# Patient Record
Sex: Female | Born: 1975
Health system: Southern US, Community
[De-identification: ages and names within clinical notes are randomized; demographics above are authoritative.]

## PROBLEM LIST (undated history)

## (undated) ENCOUNTER — Ambulatory Visit: Admission: EM | Discharge status: Absent without leave | Payer: BC Managed Care – PPO

## (undated) DIAGNOSIS — K449 Diaphragmatic hernia without obstruction or gangrene: Secondary | ICD-10-CM

## (undated) DIAGNOSIS — M545 Low back pain, unspecified: Secondary | ICD-10-CM

## (undated) DIAGNOSIS — I10 Essential (primary) hypertension: Secondary | ICD-10-CM

## (undated) DIAGNOSIS — I214 Non-ST elevation (NSTEMI) myocardial infarction: Secondary | ICD-10-CM

## (undated) DIAGNOSIS — G8929 Other chronic pain: Secondary | ICD-10-CM

## (undated) DIAGNOSIS — E785 Hyperlipidemia, unspecified: Secondary | ICD-10-CM

## (undated) DIAGNOSIS — J45909 Unspecified asthma, uncomplicated: Secondary | ICD-10-CM

## (undated) DIAGNOSIS — F32A Depression, unspecified: Secondary | ICD-10-CM

## (undated) DIAGNOSIS — I2510 Atherosclerotic heart disease of native coronary artery without angina pectoris: Secondary | ICD-10-CM

## (undated) DIAGNOSIS — F419 Anxiety disorder, unspecified: Secondary | ICD-10-CM

## (undated) DIAGNOSIS — F329 Major depressive disorder, single episode, unspecified: Secondary | ICD-10-CM

## (undated) DIAGNOSIS — E119 Type 2 diabetes mellitus without complications: Secondary | ICD-10-CM

## (undated) DIAGNOSIS — Z8489 Family history of other specified conditions: Secondary | ICD-10-CM

## (undated) DIAGNOSIS — E669 Obesity, unspecified: Secondary | ICD-10-CM

## (undated) DIAGNOSIS — K219 Gastro-esophageal reflux disease without esophagitis: Secondary | ICD-10-CM

## (undated) DIAGNOSIS — F411 Generalized anxiety disorder: Secondary | ICD-10-CM

## (undated) DIAGNOSIS — J329 Chronic sinusitis, unspecified: Secondary | ICD-10-CM

## (undated) DIAGNOSIS — R7989 Other specified abnormal findings of blood chemistry: Secondary | ICD-10-CM

## (undated) DIAGNOSIS — Z955 Presence of coronary angioplasty implant and graft: Secondary | ICD-10-CM

## (undated) DIAGNOSIS — I519 Heart disease, unspecified: Secondary | ICD-10-CM

## (undated) DIAGNOSIS — K829 Disease of gallbladder, unspecified: Secondary | ICD-10-CM

## (undated) DIAGNOSIS — Z01419 Encounter for gynecological examination (general) (routine) without abnormal findings: Secondary | ICD-10-CM

## (undated) DIAGNOSIS — I252 Old myocardial infarction: Secondary | ICD-10-CM

## (undated) DIAGNOSIS — J020 Streptococcal pharyngitis: Secondary | ICD-10-CM

## (undated) HISTORY — DX: Streptococcal pharyngitis: J02.0

## (undated) HISTORY — DX: Presence of coronary angioplasty implant and graft: Z95.5

## (undated) HISTORY — DX: Chronic sinusitis, unspecified: J32.9

## (undated) HISTORY — DX: Disease of gallbladder, unspecified: K82.9

## (undated) HISTORY — DX: Other specified abnormal findings of blood chemistry: R79.89

## (undated) HISTORY — DX: Heart disease, unspecified: I51.9

## (undated) HISTORY — DX: Old myocardial infarction: I25.2

## (undated) HISTORY — DX: Essential (primary) hypertension: I10

## (undated) HISTORY — DX: Unspecified asthma, uncomplicated: J45.909

## (undated) HISTORY — DX: Encounter for gynecological examination (general) (routine) without abnormal findings: Z01.419

## (undated) HISTORY — DX: Generalized anxiety disorder: F41.1

## (undated) HISTORY — DX: Obesity, unspecified: E66.9

## (undated) HISTORY — PX: FRACTURE SURGERY: SHX138

## (undated) HISTORY — DX: Atherosclerotic heart disease of native coronary artery without angina pectoris: I25.10

## (undated) HISTORY — DX: Type 2 diabetes mellitus without complications: E11.9

## (undated) HISTORY — DX: Hyperlipidemia, unspecified: E78.5

## (undated) HISTORY — PX: OTHER SURGICAL HISTORY: SHX169

---

## 1997-01-04 HISTORY — PX: HX EYE SURGERY: 2100001143

## 1997-07-14 ENCOUNTER — Other Ambulatory Visit: Admission: RE | Admit: 1997-07-14 | Discharge: 1997-07-14 | Payer: Self-pay | Admitting: Obstetrics

## 1997-12-20 ENCOUNTER — Encounter: Admission: RE | Admit: 1997-12-20 | Discharge: 1997-12-20 | Payer: Self-pay | Admitting: Obstetrics & Gynecology

## 1998-01-10 ENCOUNTER — Encounter: Admission: RE | Admit: 1998-01-10 | Discharge: 1998-01-10 | Payer: Self-pay | Admitting: Obstetrics & Gynecology

## 1998-01-20 ENCOUNTER — Inpatient Hospital Stay (HOSPITAL_COMMUNITY): Admission: AD | Admit: 1998-01-20 | Discharge: 1998-01-20 | Payer: Self-pay | Admitting: Obstetrics & Gynecology

## 1998-05-21 ENCOUNTER — Other Ambulatory Visit: Admission: RE | Admit: 1998-05-21 | Discharge: 1998-05-21 | Payer: Self-pay | Admitting: Obstetrics and Gynecology

## 1998-05-21 ENCOUNTER — Other Ambulatory Visit: Admission: RE | Admit: 1998-05-21 | Discharge: 1998-05-21 | Payer: Self-pay | Admitting: *Deleted

## 1998-07-24 ENCOUNTER — Ambulatory Visit: Admission: RE | Admit: 1998-07-24 | Discharge: 1998-07-24 | Payer: Self-pay | Admitting: Family Medicine

## 1998-07-24 ENCOUNTER — Encounter: Payer: Self-pay | Admitting: Family Medicine

## 1998-08-26 ENCOUNTER — Ambulatory Visit (HOSPITAL_COMMUNITY): Admission: RE | Admit: 1998-08-26 | Discharge: 1998-08-26 | Payer: Self-pay | Admitting: Gastroenterology

## 1998-12-31 ENCOUNTER — Emergency Department (HOSPITAL_COMMUNITY): Admission: EM | Admit: 1998-12-31 | Discharge: 1998-12-31 | Payer: Self-pay | Admitting: Emergency Medicine

## 1999-01-01 ENCOUNTER — Encounter: Payer: Self-pay | Admitting: Emergency Medicine

## 2000-05-26 ENCOUNTER — Other Ambulatory Visit: Admission: RE | Admit: 2000-05-26 | Discharge: 2000-05-26 | Payer: Self-pay | Admitting: Family Medicine

## 2000-07-20 ENCOUNTER — Other Ambulatory Visit: Admission: RE | Admit: 2000-07-20 | Discharge: 2000-07-20 | Payer: Self-pay | Admitting: Family Medicine

## 2000-09-26 ENCOUNTER — Encounter: Admission: RE | Admit: 2000-09-26 | Discharge: 2000-09-26 | Payer: Self-pay | Admitting: Family Medicine

## 2000-09-26 ENCOUNTER — Encounter: Payer: Self-pay | Admitting: Family Medicine

## 2001-01-04 HISTORY — PX: LEG SURGERY: SHX1003

## 2001-01-04 HISTORY — PX: FEMUR FRACTURE SURGERY: SHX633

## 2001-08-27 ENCOUNTER — Encounter: Admission: RE | Admit: 2001-08-27 | Discharge: 2001-08-27 | Payer: Self-pay | Admitting: Family Medicine

## 2001-08-27 ENCOUNTER — Encounter: Payer: Self-pay | Admitting: Family Medicine

## 2001-09-16 ENCOUNTER — Encounter: Payer: Self-pay | Admitting: Emergency Medicine

## 2001-09-16 ENCOUNTER — Encounter: Payer: Self-pay | Admitting: Specialist

## 2001-09-16 ENCOUNTER — Inpatient Hospital Stay (HOSPITAL_COMMUNITY): Admission: EM | Admit: 2001-09-16 | Discharge: 2001-09-21 | Payer: Self-pay

## 2001-10-06 ENCOUNTER — Encounter: Admission: RE | Admit: 2001-10-06 | Discharge: 2002-01-04 | Payer: Self-pay | Admitting: Specialist

## 2002-01-05 ENCOUNTER — Encounter: Admission: RE | Admit: 2002-01-05 | Discharge: 2002-04-05 | Payer: Self-pay | Admitting: Specialist

## 2002-04-06 ENCOUNTER — Encounter: Admission: RE | Admit: 2002-04-06 | Discharge: 2002-05-25 | Payer: Self-pay | Admitting: Specialist

## 2003-02-13 ENCOUNTER — Encounter: Admission: RE | Admit: 2003-02-13 | Discharge: 2003-02-27 | Payer: Self-pay | Admitting: Family Medicine

## 2004-03-17 ENCOUNTER — Encounter: Admission: RE | Admit: 2004-03-17 | Discharge: 2004-04-16 | Payer: Self-pay | Admitting: Orthopedic Surgery

## 2005-12-31 ENCOUNTER — Other Ambulatory Visit (HOSPITAL_COMMUNITY): Payer: Self-pay

## 2007-12-13 ENCOUNTER — Encounter: Admission: RE | Admit: 2007-12-13 | Discharge: 2007-12-13 | Payer: Self-pay | Admitting: Nurse Practitioner

## 2008-06-20 ENCOUNTER — Emergency Department (HOSPITAL_COMMUNITY): Admission: EM | Admit: 2008-06-20 | Discharge: 2008-06-21 | Payer: Self-pay | Admitting: Emergency Medicine

## 2010-01-24 ENCOUNTER — Encounter: Payer: Self-pay | Admitting: Family Medicine
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
## 2011-09-27 DIAGNOSIS — F411 Generalized anxiety disorder: Secondary | ICD-10-CM | POA: Insufficient documentation

## 2011-09-27 DIAGNOSIS — G4700 Insomnia, unspecified: Secondary | ICD-10-CM | POA: Insufficient documentation

## 2011-10-20 ENCOUNTER — Ambulatory Visit: Payer: Self-pay

## 2011-10-25 DIAGNOSIS — M545 Low back pain, unspecified: Secondary | ICD-10-CM | POA: Insufficient documentation

## 2012-11-10 ENCOUNTER — Emergency Department (HOSPITAL_COMMUNITY)
Admission: EM | Admit: 2012-11-10 | Discharge: 2012-11-10 | Disposition: A | Discharge status: Home / Self Care | Payer: Self-pay | Attending: Emergency Medicine | Admitting: Emergency Medicine

## 2012-11-10 ENCOUNTER — Emergency Department (HOSPITAL_COMMUNITY): Payer: Self-pay

## 2012-11-10 ENCOUNTER — Encounter (HOSPITAL_COMMUNITY): Payer: Self-pay | Admitting: Emergency Medicine

## 2012-11-10 DIAGNOSIS — K029 Dental caries, unspecified: Secondary | ICD-10-CM | POA: Insufficient documentation

## 2012-11-10 DIAGNOSIS — Z79899 Other long term (current) drug therapy: Secondary | ICD-10-CM | POA: Insufficient documentation

## 2012-11-10 DIAGNOSIS — Z880 Allergy status to penicillin: Secondary | ICD-10-CM | POA: Insufficient documentation

## 2012-11-10 LAB — CBC WITH DIFFERENTIAL/PLATELET
Basophils Absolute: 0 10*3/uL (ref 0.0–0.1)
Basophils Relative: 0 % (ref 0–1)
Eosinophils Absolute: 0.3 10*3/uL (ref 0.0–0.7)
Eosinophils Relative: 3 % (ref 0–5)
HCT: 38.9 % (ref 36.0–46.0)
Hemoglobin: 13.1 g/dL (ref 12.0–15.0)
Lymphocytes Relative: 22 % (ref 12–46)
Lymphs Abs: 2 10*3/uL (ref 0.7–4.0)
MCH: 31.4 pg (ref 26.0–34.0)
MCHC: 33.7 g/dL (ref 30.0–36.0)
MCV: 93.3 fL (ref 78.0–100.0)
Monocytes Absolute: 0.9 10*3/uL (ref 0.1–1.0)
Monocytes Relative: 10 % (ref 3–12)
Neutro Abs: 6.1 10*3/uL (ref 1.7–7.7)
Neutrophils Relative %: 65 % (ref 43–77)
Platelets: 235 10*3/uL (ref 150–400)
RBC: 4.17 MIL/uL (ref 3.87–5.11)
RDW: 12.7 % (ref 11.5–15.5)
WBC: 9.4 10*3/uL (ref 4.0–10.5)

## 2012-11-10 LAB — BASIC METABOLIC PANEL
Calcium: 9.5 mg/dL (ref 8.4–10.5)
GFR calc Af Amer: >90 mL/min (ref >90)
GFR calc non Af Amer: >90 mL/min (ref >90)
Potassium: 4.3 mEq/L (ref 3.5–5.1)
Sodium: 137 mEq/L (ref 135–145)

## 2012-11-10 MED ORDER — MORPHINE SULFATE 4 MG/ML IJ SOLN
4.0000 mg | Freq: Once | INTRAMUSCULAR | Status: AC
  Administered 2012-11-10: 4 mg via INTRAVENOUS
  Filled 2012-11-10: qty 1

## 2012-11-10 MED ORDER — IOHEXOL 300 MG/ML  SOLN
75.0000 mL | Freq: Once | INTRAMUSCULAR | Status: AC | PRN
  Administered 2012-11-10: 75 mL via INTRAVENOUS

## 2012-11-10 MED ORDER — CLINDAMYCIN PHOSPHATE 600 MG/50ML IV SOLN
600.0000 mg | Freq: Once | INTRAVENOUS | Status: AC
  Administered 2012-11-10: 600 mg via INTRAVENOUS
  Filled 2012-11-10: qty 50

## 2012-11-10 MED ORDER — KETOROLAC TROMETHAMINE 30 MG/ML IJ SOLN: 30.0000 mg | Freq: Once | INTRAMUSCULAR | Status: DC

## 2012-11-10 MED ORDER — ONDANSETRON HCL 4 MG/2ML IJ SOLN
4.0000 mg | Freq: Once | INTRAMUSCULAR | Status: AC
  Administered 2012-11-10: 4 mg via INTRAVENOUS
  Filled 2012-11-10: qty 2

## 2012-11-10 MED ORDER — HYDROCODONE-ACETAMINOPHEN 5-325 MG PO TABS: 2.0000 | ORAL_TABLET | Freq: Once | ORAL | Status: DC

## 2012-11-10 MED ORDER — ONDANSETRON HCL 4 MG PO TABS
4.0000 mg | ORAL_TABLET | Freq: Once | ORAL | Status: AC
  Administered 2012-11-10: 4 mg via ORAL
  Filled 2012-11-10: qty 1

## 2012-11-10 MED ORDER — MORPHINE SULFATE 4 MG/ML IJ SOLN: 4.0000 mg | Freq: Once | INTRAMUSCULAR | Status: DC

## 2012-11-10 MED ORDER — MORPHINE SULFATE 4 MG/ML IJ SOLN
INTRAMUSCULAR | Status: AC
  Filled 2012-11-10: qty 1

## 2012-11-10 MED ORDER — ONDANSETRON HCL 4 MG/2ML IJ SOLN: 4.0000 mg | Freq: Once | INTRAMUSCULAR | Status: DC

## 2012-11-10 MED ORDER — SODIUM CHLORIDE 0.9 % IJ SOLN
INTRAMUSCULAR | Status: AC
  Filled 2012-11-10: qty 500

## 2012-11-10 NOTE — ED Provider Notes (Signed)
CSN: [id number]     Arrival date & time 11/10/12  1358 History   First MD Initiated Contact with Patient 11/10/12 1447     Chief Complaint  Patient presents with  . Oral Swelling   (Consider location/radiation/quality/duration/timing/severity/associated sxs/prior Treatment) HPI Comments: Patient is a 37 year old female who presents to the emergency department with swelling of my mouth. The patient states that she was seen by the dentist on yesterday November 6 her teeth were evaluated, and it was determined that she would need to be on antibiotics before any action to be taken with her teeth. The patient received a dental block. She was placed on clindamycin and Vicodin. The patient states that this morning she noted some swelling of the area under her tongue. Patient states she had some difficulty with swallowing, felt somewhat dizzy at times, and had more pain. The patient states she spoke with the dentists and was told to come to the emergency department for additional evaluation and possible IV antibiotic. The patient denies any high fever. She has had some nausea but no vomiting. The patient denies any medical conditions that would cause immunocompromise.  The history is provided by the patient.    History reviewed. No pertinent past medical history. Past Surgical History  Procedure Laterality Date  . Fracture surgery     History reviewed. No pertinent family history. History  Substance Use Topics  . Smoking status: Never Smoker   . Smokeless tobacco: Not on file  . Alcohol Use: Yes   OB History   Grav Para Term Preterm Abortions TAB SAB Ect Mult Living                 Review of Systems  Constitutional: Negative for activity change.       All ROS Neg except as noted in HPI  HENT: Positive for dental problem. Negative for nosebleeds.   Eyes: Negative for photophobia and discharge.  Respiratory: Negative for cough, shortness of breath and wheezing.   Cardiovascular: Negative  for chest pain and palpitations.  Gastrointestinal: Negative for abdominal pain and blood in stool.  Genitourinary: Negative for dysuria, frequency and hematuria.  Musculoskeletal: Negative for arthralgias, back pain and neck pain.  Skin: Negative.   Neurological: Negative for dizziness, seizures and speech difficulty.  Psychiatric/Behavioral: Negative for hallucinations and confusion.    Allergies  Augmentin; Benadryl; and Penicillins  Home Medications   Current Outpatient Rx  Name  Route  Sig  Dispense  Refill  . albuterol (PROVENTIL HFA;VENTOLIN HFA) 108 (90 BASE) MCG/ACT inhaler   Inhalation   Inhale 2 puffs into the lungs every 6 (six) hours as needed for wheezing or shortness of breath.         . citalopram (CELEXA) 40 MG tablet   Oral   Take 40 mg by mouth daily.         . clindamycin (CLEOCIN) 300 MG capsule   Oral   Take 300 mg by mouth. 2 capsules on 11/09/12 then 1 capsule every 6 hours until gone         . etonogestrel (NEXPLANON) 68 MG IMPL implant   Subcutaneous   Inject 1 each into the skin once.         . Hydrocodone-Acetaminophen 7.5-300 MG TABS   Oral   Take 1 tablet by mouth every 6 (six) hours.         Marland Kitchen ipratropium-albuterol (DUONEB) 0.5-2.5 (3) MG/3ML SOLN   Nebulization   Take 1.5 mLs by nebulization 3 (  three) times daily as needed (shortness of breath).          . miconazole (MONISTAT-3) KIT   Vaginal   Place 1 kit vaginally at bedtime as needed (yeast infection).           Pulse 98  Temp(Src) 98.7 F (37.1 C) (Oral)  Resp 20  Ht 5\' 7"  (1.702 m)  Wt 238 lb (107.956 kg)  BMI 37.27 kg/m2  SpO2 99% Physical Exam  Nursing note and vitals reviewed. Constitutional: She is oriented to person, place, and time. She appears well-developed and well-nourished.  Non-toxic appearance.  HENT:  Head: Normocephalic.  Right Ear: Tympanic membrane and external ear normal.  Left Ear: Tympanic membrane and external ear normal.  And at the  floor of the mouth along the buccal mucosa. The airway is patent. Speech is clear. There is a cavity of the first molar. There are teeth that are missing. There is a cavity of tooth #5. There is tenderness to the gum in the lateral and anterior portion.  Eyes: EOM and lids are normal. Pupils are equal, round, and reactive to light.  Neck: Normal range of motion. Neck supple. Carotid bruit is not present.  There is fullness in the submental area there is tenderness in this area. The trachea is in the midline.  Cardiovascular: Normal rate, regular rhythm, normal heart sounds, intact distal pulses and normal pulses.   Pulmonary/Chest: Breath sounds normal. No respiratory distress.  Abdominal: Soft. Bowel sounds are normal. There is no tenderness. There is no guarding.  Musculoskeletal: Normal range of motion.  Lymphadenopathy:       Head (right side): No submandibular adenopathy present.       Head (left side): No submandibular adenopathy present.    She has no cervical adenopathy.  Neurological: She is alert and oriented to person, place, and time. She has normal strength. No cranial nerve deficit or sensory deficit.  Skin: Skin is warm and dry.  Psychiatric: She has a normal mood and affect. Her speech is normal.    ED Course  Procedures (including critical care time) Labs Review Labs Reviewed  BASIC METABOLIC PANEL - Abnormal; Notable for the following:    Glucose, Bld 100 (*)    All other components within normal limits  CBC WITH DIFFERENTIAL   Imaging Review Ct Soft Tissue Neck W Contrast  11/10/2012   CLINICAL DATA:  Right submandibular swelling.  Dental disease.  EXAM: CT NECK WITH CONTRAST  TECHNIQUE: Multidetector CT imaging of the neck was performed using the standard protocol following the bolus administration of intravenous contrast.  CONTRAST:  75mL OMNIPAQUE IOHEXOL 300 MG/ML  SOLN  COMPARISON:  None.  FINDINGS: Dental caries, missing teeth, and restorations. Periapical lucency  involving tooth 5 with erosion of the cortex of the adjacent alveolar ridge (image 30/3). Negative for soft tissue abscess. No adenopathy. Normal vascular enhancement. Degenerative disc disease C5-6 with posterior protrusion incidentally noted.  IMPRESSION: 1. Dental caries with periapical abscess of tooth 5 eroding through the cortex of the alveolar ridge. 2. Degenerative disc disease C5-6.   Electronically Signed   By: Oley Balm M.D.   On: 11/10/2012 17:12    EKG Interpretation   None       MDM  No diagnosis found. *I have reviewed nursing notes, vital signs, and all appropriate lab and imaging results for this patient.**  Patient had a CT scan of the neck with contrast which revealed dental caries present. Missing teeth present. There  is a periapical lucency involving tooth #5 with erosion of the cortex to the alveolar ridge. There is also noted degenerative disc disease at C5-C6 with posterior protrusion. There is no soft tissue abscess appreciated. There is no interference with the airway appreciated.  Patient treated with IV morphine for pain, and IV clindamycin. Test results given to the patient. Patient is to be discharged and followed by the dentist next week. Patient advised to return to the emergency department if any changes, problems, or concerns.  Kathie Dike, PA-C 11/13/12 [phone]

## 2012-11-10 NOTE — ED Notes (Signed)
Pain , swelling under tongue, Had  Injection around tooth yesterday, since then increased swelling.  Started clindamycin and vicodin  Yesterday.  Feels dizzy at times.

## 2012-11-13 NOTE — ED Provider Notes (Signed)
Medical screening examination/treatment/procedure(s) were performed by non-physician practitioner and as supervising physician I was immediately available for consultation/collaboration.  EKG Interpretation   None         Taisia Fantini L Satsuki Zillmer, MD 11/13/12 2211 

## 2014-01-04 HISTORY — PX: HX CORONARY STENT PLACEMENT: SHX49

## 2014-06-02 ENCOUNTER — Emergency Department (HOSPITAL_COMMUNITY): Payer: BLUE CROSS/BLUE SHIELD

## 2014-06-02 ENCOUNTER — Emergency Department (HOSPITAL_COMMUNITY)
Admission: EM | Admit: 2014-06-02 | Discharge: 2014-06-03 | Disposition: A | Discharge status: Home / Self Care | Payer: BLUE CROSS/BLUE SHIELD | Attending: Emergency Medicine | Admitting: Emergency Medicine

## 2014-06-02 ENCOUNTER — Encounter (HOSPITAL_COMMUNITY): Payer: Self-pay | Admitting: Emergency Medicine

## 2014-06-02 DIAGNOSIS — Z720 Tobacco use: Secondary | ICD-10-CM | POA: Diagnosis not present

## 2014-06-02 DIAGNOSIS — R079 Chest pain, unspecified: Secondary | ICD-10-CM

## 2014-06-02 DIAGNOSIS — I10 Essential (primary) hypertension: Secondary | ICD-10-CM | POA: Diagnosis not present

## 2014-06-02 DIAGNOSIS — Z79899 Other long term (current) drug therapy: Secondary | ICD-10-CM | POA: Insufficient documentation

## 2014-06-02 DIAGNOSIS — Z8719 Personal history of other diseases of the digestive system: Secondary | ICD-10-CM | POA: Insufficient documentation

## 2014-06-02 DIAGNOSIS — Z880 Allergy status to penicillin: Secondary | ICD-10-CM | POA: Insufficient documentation

## 2014-06-02 DIAGNOSIS — J45909 Unspecified asthma, uncomplicated: Secondary | ICD-10-CM | POA: Insufficient documentation

## 2014-06-02 HISTORY — DX: Diaphragmatic hernia without obstruction or gangrene: K44.9

## 2014-06-02 HISTORY — DX: Essential (primary) hypertension: I10

## 2014-06-02 LAB — CBC
HEMATOCRIT: 38.9 % (ref 36.0–46.0)
HEMOGLOBIN: 13.3 g/dL (ref 12.0–15.0)
MCH: 31.7 pg (ref 26.0–34.0)
MCHC: 34.2 g/dL (ref 30.0–36.0)
MCV: 92.8 fL (ref 78.0–100.0)
Platelets: 212 10*3/uL (ref 150–400)
RBC: 4.19 MIL/uL (ref 3.87–5.11)
RDW: 13.3 % (ref 11.5–15.5)
WBC: 11.5 10*3/uL — AB (ref 4.0–10.5)

## 2014-06-02 LAB — I-STAT TROPONIN, ED: TROPONIN I, POC: 0.02 ng/mL (ref 0.00–0.08)

## 2014-06-02 LAB — BASIC METABOLIC PANEL
Anion gap: 9 (ref 5–15)
BUN: 12 mg/dL (ref 6–20)
CO2: 25 mmol/L (ref 22–32)
Calcium: 8.7 mg/dL — ABNORMAL LOW (ref 8.9–10.3)
Chloride: 103 mmol/L (ref 101–111)
Creatinine, Ser: 0.89 mg/dL (ref 0.44–1.00)
Glucose, Bld: 119 mg/dL — ABNORMAL HIGH (ref 65–99)
POTASSIUM: 3.9 mmol/L (ref 3.5–5.1)
Sodium: 137 mmol/L (ref 135–145)

## 2014-06-02 LAB — BRAIN NATRIURETIC PEPTIDE: B Natriuretic Peptide: 34.1 pg/mL (ref 0.0–100.0)

## 2014-06-02 MED ORDER — MORPHINE SULFATE 4 MG/ML IJ SOLN
4.0000 mg | Freq: Once | INTRAMUSCULAR | Status: AC
  Administered 2014-06-03: 4 mg via INTRAVENOUS
  Filled 2014-06-02: qty 1

## 2014-06-02 MED ORDER — NITROGLYCERIN 0.4 MG SL SUBL: 0.4000 mg | SUBLINGUAL_TABLET | SUBLINGUAL | Status: DC | PRN

## 2014-06-02 NOTE — ED Provider Notes (Signed)
CSN: [id number]     Arrival date & time 06/02/14  2228 History   First MD Initiated Contact with Patient 06/02/14 2259     Chief Complaint  Patient presents with  . Chest Pain  . Anxiety     (Consider location/radiation/quality/duration/timing/severity/associated sxs/prior Treatment) HPI Kristen Price is a 39 year old female with a history of hypertension, asthma, hiatal hernia who presents by EMS for intermittent chest pain that began 4 days ago. She states it has since worsened and is now radiating down the left arm with tingling and into the left side of her neck and left ear. She states it is worse with movement. She is currently on birth control. She is a one pack per day smoker. She denies any daily aspirin use. She states both her mother and father had a heart attack before the age of 25. She denies any fever, chills, cough, shortness of breath, abdominal pain, nausea, vomiting, dysuria, hematuria, urinary frequency, leg swelling. Denies any dyspnea on exertion. She states EMS gave her aspirin in route. Past Medical History  Diagnosis Date  . Hiatal hernia   . Hypertension   . Back ache    Past Surgical History  Procedure Laterality Date  . Fracture surgery     No family history on file. History  Substance Use Topics  . Smoking status: Current Every Day Smoker -- 1.00 packs/day for 25 years    Types: Cigarettes  . Smokeless tobacco: Not on file  . Alcohol Use: Yes     Comment: occasionally   OB History    No data available     Review of Systems  Cardiovascular: Negative for palpitations.  Neurological: Negative for dizziness, weakness, light-headedness and headaches.  All other systems reviewed and are negative.     Allergies  Augmentin; Benadryl; and Penicillins  Home Medications   Prior to Admission medications   Medication Sig Start Date End Date Taking? Authorizing Provider  albuterol (PROVENTIL HFA;VENTOLIN HFA) 108 (90 BASE) MCG/ACT inhaler Inhale 2 puffs  into the lungs every 6 (six) hours as needed for wheezing or shortness of breath.   Yes Historical Provider, MD  alprazolam Duanne Moron) 2 MG tablet Take 0.5-2 mg by mouth 2 (two) times daily as needed for sleep or anxiety.   Yes Historical Provider, MD  escitalopram (LEXAPRO) 20 MG tablet Take 20 mg by mouth daily.   Yes Historical Provider, MD  etonogestrel (NEXPLANON) 68 MG IMPL implant Inject 1 each into the skin once.   Yes Historical Provider, MD  HYDROcodone-acetaminophen (NORCO) 10-325 MG per tablet Take 1 tablet by mouth every 6 (six) hours as needed for moderate pain.   Yes Historical Provider, MD  ipratropium-albuterol (DUONEB) 0.5-2.5 (3) MG/3ML SOLN Take 1.5 mLs by nebulization 3 (three) times daily as needed (shortness of breath).    Yes Historical Provider, MD  methocarbamol (ROBAXIN) 750 MG tablet Take 750 mg by mouth every 6 (six) hours as needed for muscle spasms.   Yes Historical Provider, MD  metoprolol (LOPRESSOR) 50 MG tablet Take 50 mg by mouth 2 (two) times daily.   Yes Historical Provider, MD  miconazole (MONISTAT-3) KIT Place 1 kit vaginally at bedtime as needed (yeast infection).    Yes Historical Provider, MD  montelukast (SINGULAIR) 10 MG tablet Take 10 mg by mouth at bedtime.   Yes Historical Provider, MD  omeprazole (PRILOSEC) 40 MG capsule Take 40 mg by mouth daily.   Yes Historical Provider, MD   BP 114/66 mmHg  Pulse 73  Temp(Src) 99.3 F (37.4 C) (Oral)  Resp 22  Ht _0  (1.702 m)  Wt 194 lb (87.998 kg)  BMI 30.38 kg/m2  SpO2 94% Physical Exam  Constitutional: She is oriented to person, place, and time. She appears well-developed and well-nourished.  HENT:  Head: Normocephalic and atraumatic.  Eyes: Conjunctivae are normal.  Neck: Normal range of motion. Neck supple.  Cardiovascular: Normal rate, regular rhythm and normal heart sounds.   No murmur heard. Pulmonary/Chest: Effort normal and breath sounds normal. No respiratory distress. She has no wheezes.  She has no rales.  Abdominal: Soft. There is no tenderness.  Musculoskeletal: Normal range of motion. She exhibits no edema.  Neurological: She is alert and oriented to person, place, and time.  Skin: Skin is warm and dry.  Nursing note and vitals reviewed.   ED Course  Procedures (including critical care time) Labs Review Labs Reviewed  CBC - Abnormal; Notable for the following:    WBC 11.5 (*)    All other components within normal limits  BASIC METABOLIC PANEL - Abnormal; Notable for the following:    Glucose, Bld 119 (*)    Calcium 8.7 (*)    All other components within normal limits  BRAIN NATRIURETIC PEPTIDE  I-STAT TROPOININ, ED  Randolm Idol, ED    Imaging Review Dg Chest Port 1 View  06/02/2014   CLINICAL DATA:  Chest pain, anxiety.  EXAM: PORTABLE CHEST - 1 VIEW  COMPARISON:  12/13/2007  FINDINGS: The cardiomediastinal contours are normal. The lungs are clear. Pulmonary vasculature is normal. No consolidation, pleural effusion, or pneumothorax. No acute osseous abnormalities are seen.  IMPRESSION: No acute pulmonary process.   Electronically Signed   By: Jeb Levering M.D.   On: 06/02/2014 23:45     EKG Interpretation   Date/Time:  Sunday Jun 02 2014 22:41:42 EDT Ventricular Rate:  79 PR Interval:  134 QRS Duration: 88 QT Interval:  403 QTC Calculation: 462 R Axis:   73 Text Interpretation:  Sinus rhythm normal intervals No old tracing to  compare Confirmed by Kathrynn Humble, MD, Thelma Comp [phone]) on 06/02/2014 11:55:58 PM      MDM   Final diagnoses:  Chest pain, unspecified chest pain type   A with a history of asthma and hypertension presents for intermittent chest pain for the last 4 days which is worse today with radiation down the left arm and left side of her neck. Her vitals and EKG are normal.  Her troponin is negative and labs are not concerning. CXR is negative for pneumonia, edema, or pneumothorax. I reviewed the heart score and she is at low risk for  major cardiac event.  No prior cardiac history. She will get a repeat troponin in 4 hours. I discussed this patient with Dr. Kathrynn Humble who has seen and evaluated the patient. She has been given both morphine and nitro.  She had aspirin en route to hospital by EMS.  I also reviewed the PERC score and the patient is not tachycardic, no prior history of DVT, no cough, and no pleuritic pain.  My suspicion for PE is very low.  I transferred care of this patient to Dr. Dollene Cleveland and the plan is to send the patient home with follow up if the troponin is negative.       Ottie Glazier, PA-C 06/03/14 [place], MD 06/09/14 1539

## 2014-06-02 NOTE — ED Notes (Signed)
Per EMS: cp for week, does not know what really started it, hurt under left arm radiates down left arm.  Took 3 hydrocodone for back, kind of relieved chest pain.  Last was 2030.  Really anxious at scene with EMS.  FD states patient was having a sort of panic attack.  VSS.  Ambulatory to room. Met ems at door.  On phone in ems truck during whole ride per ems.

## 2014-06-02 NOTE — ED Notes (Signed)
Pt states she was passed out while ems was there.  Ems did not report this.

## 2014-06-03 LAB — I-STAT TROPONIN, ED: TROPONIN I, POC: 0.04 ng/mL (ref 0.00–0.08)

## 2014-06-03 MED ORDER — NITROGLYCERIN 0.4 MG SL SUBL: 0.4000 mg | SUBLINGUAL_TABLET | SUBLINGUAL | Status: DC | PRN

## 2014-06-03 NOTE — Discharge Instructions (Signed)
We saw you in the ER for the chest pain/shortness of breath. All of our cardiac workup is normal, including labs, EKG and chest X-RAY are normal. We are not sure what is causing your discomfort, but we feel comfortable sending you home at this time. The workup in the ER is not complete, and you should follow up with your primary care doctor for further evaluation.  Return to the ER if the chest pain gets more constant, there is sweats, shortness of breath, nausea.   Chest Pain (Nonspecific) It is often hard to give a specific diagnosis for the cause of chest pain. There is always a chance that your pain could be related to something serious, such as a heart attack or a blood clot in the lungs. You need to follow up with your health care provider for further evaluation. CAUSES   Heartburn.  Pneumonia or bronchitis.  Anxiety or stress.  Inflammation around your heart (pericarditis) or lung (pleuritis or pleurisy).  A blood clot in the lung.  A collapsed lung (pneumothorax). It can develop suddenly on its own (spontaneous pneumothorax) or from trauma to the chest.  Shingles infection (herpes zoster virus). The chest wall is composed of bones, muscles, and cartilage. Any of these can be the source of the pain.  The bones can be bruised by injury.  The muscles or cartilage can be strained by coughing or overwork.  The cartilage can be affected by inflammation and become sore (costochondritis). DIAGNOSIS  Lab tests or other studies may be needed to find the cause of your pain. Your health care provider may have you take a test called an ambulatory electrocardiogram (ECG). An ECG records your heartbeat patterns over a 24-hour period. You may also have other tests, such as:  Transthoracic echocardiogram (TTE). During echocardiography, sound waves are used to evaluate how blood flows through your heart.  Transesophageal echocardiogram (TEE).  Cardiac monitoring. This allows your health  care provider to monitor your heart rate and rhythm in real time.  Holter monitor. This is a portable device that records your heartbeat and can help diagnose heart arrhythmias. It allows your health care provider to track your heart activity for several days, if needed.  Stress tests by exercise or by giving medicine that makes the heart beat faster. TREATMENT   Treatment depends on what may be causing your chest pain. Treatment may include:  Acid blockers for heartburn.  Anti-inflammatory medicine.  Pain medicine for inflammatory conditions.  Antibiotics if an infection is present.  You may be advised to change lifestyle habits. This includes stopping smoking and avoiding alcohol, caffeine, and chocolate.  You may be advised to keep your head raised (elevated) when sleeping. This reduces the chance of acid going backward from your stomach into your esophagus. Most of the time, nonspecific chest pain will improve within 2-3 days with rest and mild pain medicine.  HOME CARE INSTRUCTIONS   If antibiotics were prescribed, take them as directed. Finish them even if you start to feel better.  For the next few days, avoid physical activities that bring on chest pain. Continue physical activities as directed.  Do not use any tobacco products, including cigarettes, chewing tobacco, or electronic cigarettes.  Avoid drinking alcohol.  Only take medicine as directed by your health care provider.  Follow your health care provider's suggestions for further testing if your chest pain does not go away.  Keep any follow-up appointments you made. If you do not go to an appointment, you  could develop lasting (chronic) problems with pain. If there is any problem keeping an appointment, call to reschedule. SEEK MEDICAL CARE IF:   Your chest pain does not go away, even after treatment.  You have a rash with blisters on your chest.  You have a fever. SEEK IMMEDIATE MEDICAL CARE IF:   You have  increased chest pain or pain that spreads to your arm, neck, jaw, back, or abdomen.  You have shortness of breath.  You have an increasing cough, or you cough up blood.  You have severe back or abdominal pain.  You feel nauseous or vomit.  You have severe weakness.  You faint.  You have chills. This is an emergency. Do not wait to see if the pain will go away. Get medical help at once. Call your local emergency services (911 in [place] Do not drive yourself to the hospital. MAKE SURE YOU:   Understand these instructions.  Will watch your condition.  Will get help right away if you are not doing well or get worse. Document Released: 09/30/2004 Document Revised: 12/26/2012 Document Reviewed: 07/27/2007 [facility] Patient Information 2015 [place], [place]. This information is not intended to replace advice given to you by your health care provider. Make sure you discuss any questions you have with your health care provider.

## 2014-06-03 NOTE — ED Provider Notes (Addendum)
Medical screening examination/treatment/procedure(s) were conducted as a shared visit with non-physician practitioner(s) and myself.  I personally evaluated the patient during the encounter.   EKG Interpretation   Date/Time:  Sunday Jun 02 2014 22:41:42 EDT Ventricular Rate:  79 PR Interval:  134 QRS Duration: 88 QT Interval:  403 QTC Calculation: 462 R Axis:   73 Text Interpretation:  Sinus rhythm normal intervals No old tracing to  compare Confirmed by Rhunette CroftNANAVATI, MD, Janey GentaANKIT [phone] on 06/02/2014 11:55:58 PM      Chest pain free currently. Trops x 2 neg. Pain has CP with atypical and typical features, but has significant cardiac hx in family, so spoke with the fellow on call, and they will contact the patient for an outpatient stress. Return precautions discussed. Pt agreeable with the plan.  Derwood KaplanAnkit Veronia Laprise, MD 06/03/14 [phone]  Derwood KaplanAnkit Naquan Garman, MD 06/09/14 [phone]

## 2014-06-03 NOTE — ED Notes (Signed)
Pt stable, ambulatory, states understanding of discharge instructions, family at bedside. 

## 2014-06-06 ENCOUNTER — Encounter (HOSPITAL_COMMUNITY): Payer: Self-pay | Admitting: Emergency Medicine

## 2014-06-06 ENCOUNTER — Emergency Department (HOSPITAL_COMMUNITY): Payer: BLUE CROSS/BLUE SHIELD

## 2014-06-06 ENCOUNTER — Inpatient Hospital Stay (HOSPITAL_COMMUNITY)
Admission: EM | Admit: 2014-06-06 | Discharge: 2014-06-08 | DRG: 247 | Disposition: A | Discharge status: Home / Self Care | Payer: BLUE CROSS/BLUE SHIELD | Attending: Cardiovascular Disease | Admitting: Cardiovascular Disease

## 2014-06-06 DIAGNOSIS — Z880 Allergy status to penicillin: Secondary | ICD-10-CM | POA: Diagnosis not present

## 2014-06-06 DIAGNOSIS — R778 Other specified abnormalities of plasma proteins: Secondary | ICD-10-CM | POA: Insufficient documentation

## 2014-06-06 DIAGNOSIS — F419 Anxiety disorder, unspecified: Secondary | ICD-10-CM | POA: Diagnosis present

## 2014-06-06 DIAGNOSIS — I252 Old myocardial infarction: Secondary | ICD-10-CM

## 2014-06-06 DIAGNOSIS — Z888 Allergy status to other drugs, medicaments and biological substances status: Secondary | ICD-10-CM

## 2014-06-06 DIAGNOSIS — Z720 Tobacco use: Secondary | ICD-10-CM

## 2014-06-06 DIAGNOSIS — I249 Acute ischemic heart disease, unspecified: Secondary | ICD-10-CM

## 2014-06-06 DIAGNOSIS — Z79891 Long term (current) use of opiate analgesic: Secondary | ICD-10-CM | POA: Diagnosis not present

## 2014-06-06 DIAGNOSIS — K449 Diaphragmatic hernia without obstruction or gangrene: Secondary | ICD-10-CM | POA: Diagnosis present

## 2014-06-06 DIAGNOSIS — Z79899 Other long term (current) drug therapy: Secondary | ICD-10-CM | POA: Diagnosis not present

## 2014-06-06 DIAGNOSIS — I214 Non-ST elevation (NSTEMI) myocardial infarction: Principal | ICD-10-CM

## 2014-06-06 DIAGNOSIS — Z8249 Family history of ischemic heart disease and other diseases of the circulatory system: Secondary | ICD-10-CM

## 2014-06-06 DIAGNOSIS — K219 Gastro-esophageal reflux disease without esophagitis: Secondary | ICD-10-CM | POA: Diagnosis present

## 2014-06-06 DIAGNOSIS — R079 Chest pain, unspecified: Secondary | ICD-10-CM

## 2014-06-06 DIAGNOSIS — Z881 Allergy status to other antibiotic agents status: Secondary | ICD-10-CM

## 2014-06-06 DIAGNOSIS — I10 Essential (primary) hypertension: Secondary | ICD-10-CM | POA: Diagnosis present

## 2014-06-06 DIAGNOSIS — R7989 Other specified abnormal findings of blood chemistry: Secondary | ICD-10-CM

## 2014-06-06 HISTORY — DX: Old myocardial infarction: I25.2

## 2014-06-06 HISTORY — DX: Low back pain, unspecified: M54.50

## 2014-06-06 HISTORY — DX: Family history of other specified conditions: Z84.89

## 2014-06-06 HISTORY — DX: Low back pain: M54.5

## 2014-06-06 HISTORY — DX: Gastro-esophageal reflux disease without esophagitis: K21.9

## 2014-06-06 HISTORY — DX: Anxiety disorder, unspecified: F41.9

## 2014-06-06 HISTORY — DX: Depression, unspecified: F32.A

## 2014-06-06 HISTORY — DX: Other chronic pain: G89.29

## 2014-06-06 HISTORY — DX: Major depressive disorder, single episode, unspecified: F32.9

## 2014-06-06 HISTORY — DX: Unspecified asthma, uncomplicated: J45.909

## 2014-06-06 HISTORY — DX: Non-ST elevation (NSTEMI) myocardial infarction: I21.4

## 2014-06-06 LAB — CBC
HEMATOCRIT: 40.1 % (ref 36.0–46.0)
HEMOGLOBIN: 13.5 g/dL (ref 12.0–15.0)
MCH: 31.3 pg (ref 26.0–34.0)
MCHC: 33.7 g/dL (ref 30.0–36.0)
MCV: 93 fL (ref 78.0–100.0)
Platelets: 222 10*3/uL (ref 150–400)
RBC: 4.31 MIL/uL (ref 3.87–5.11)
RDW: 13.3 % (ref 11.5–15.5)
WBC: 12.8 10*3/uL — ABNORMAL HIGH (ref 4.0–10.5)

## 2014-06-06 LAB — TROPONIN I
Troponin I: 0.99 ng/mL (ref <0.031)
Troponin I: 0.99 ng/mL (ref <0.031)

## 2014-06-06 LAB — COMPREHENSIVE METABOLIC PANEL
ALT: 30 U/L (ref 14–54)
ANION GAP: 6 (ref 5–15)
AST: 30 U/L (ref 15–41)
Albumin: 3.5 g/dL (ref 3.5–5.0)
Alkaline Phosphatase: 58 U/L (ref 38–126)
BILIRUBIN TOTAL: 0.4 mg/dL (ref 0.3–1.2)
BUN: 9 mg/dL (ref 6–20)
CO2: 25 mmol/L (ref 22–32)
Calcium: 8.9 mg/dL (ref 8.9–10.3)
Chloride: 107 mmol/L (ref 101–111)
Creatinine, Ser: 0.76 mg/dL (ref 0.44–1.00)
GFR calc non Af Amer: >60 mL/min (ref >60)
Glucose, Bld: 115 mg/dL — ABNORMAL HIGH (ref 65–99)
Potassium: 4.2 mmol/L (ref 3.5–5.1)
Sodium: 138 mmol/L (ref 135–145)
Total Protein: 6.1 g/dL — ABNORMAL LOW (ref 6.5–8.1)

## 2014-06-06 LAB — GLUCOSE, CAPILLARY: Glucose-Capillary: 120 mg/dL — ABNORMAL HIGH (ref 65–99)

## 2014-06-06 LAB — HEPARIN LEVEL (UNFRACTIONATED): Heparin Unfractionated: 0.41 IU/mL (ref 0.30–0.70)

## 2014-06-06 LAB — LIPASE, BLOOD: Lipase: 19 U/L — ABNORMAL LOW (ref 22–51)

## 2014-06-06 MED ORDER — ASPIRIN 81 MG PO CHEW
81.0000 mg | CHEWABLE_TABLET | ORAL | Status: AC
  Administered 2014-06-07: 81 mg via ORAL
  Filled 2014-06-06: qty 1

## 2014-06-06 MED ORDER — NITROGLYCERIN IN D5W 200-5 MCG/ML-% IV SOLN
5.0000 ug/min | INTRAVENOUS | Status: DC
  Administered 2014-06-06: 5 ug/min via INTRAVENOUS
  Filled 2014-06-06 (×2): qty 250

## 2014-06-06 MED ORDER — ALPRAZOLAM 0.25 MG PO TABS
0.2500 mg | ORAL_TABLET | Freq: Two times a day (BID) | ORAL | Status: DC | PRN
  Administered 2014-06-06: 0.25 mg via ORAL
  Filled 2014-06-06: qty 1

## 2014-06-06 MED ORDER — NICOTINE 21 MG/24HR TD PT24
21.0000 mg | MEDICATED_PATCH | Freq: Every day | TRANSDERMAL | Status: DC
  Administered 2014-06-06 – 2014-06-08 (×3): 21 mg via TRANSDERMAL
  Filled 2014-06-06 (×4): qty 1

## 2014-06-06 MED ORDER — ALBUTEROL SULFATE HFA 108 (90 BASE) MCG/ACT IN AERS: 2.0000 | INHALATION_SPRAY | Freq: Four times a day (QID) | RESPIRATORY_TRACT | Status: DC | PRN

## 2014-06-06 MED ORDER — HEPARIN (PORCINE) IN NACL 100-0.45 UNIT/ML-% IJ SOLN
1150.0000 [IU]/h | INTRAMUSCULAR | Status: DC
  Administered 2014-06-06: 1000 [IU]/h via INTRAVENOUS
  Filled 2014-06-06 (×3): qty 250

## 2014-06-06 MED ORDER — NITROGLYCERIN 0.4 MG SL SUBL
0.4000 mg | SUBLINGUAL_TABLET | SUBLINGUAL | Status: DC | PRN
  Administered 2014-06-06: 0.4 mg via SUBLINGUAL
  Filled 2014-06-06 (×2): qty 1

## 2014-06-06 MED ORDER — HEPARIN BOLUS VIA INFUSION
4000.0000 [IU] | Freq: Once | INTRAVENOUS | Status: AC
  Administered 2014-06-06: 4000 [IU] via INTRAVENOUS
  Filled 2014-06-06: qty 4000

## 2014-06-06 MED ORDER — ACETAMINOPHEN 325 MG PO TABS
650.0000 mg | ORAL_TABLET | ORAL | Status: DC | PRN
Start: 2014-06-06 — End: 2014-06-07
  Administered 2014-06-06 – 2014-06-07 (×2): 650 mg via ORAL
  Filled 2014-06-06 (×2): qty 2

## 2014-06-06 MED ORDER — ASPIRIN 81 MG PO CHEW
324.0000 mg | CHEWABLE_TABLET | Freq: Once | ORAL | Status: AC
  Administered 2014-06-06: 324 mg via ORAL
  Filled 2014-06-06: qty 4

## 2014-06-06 MED ORDER — FAMOTIDINE 20 MG PO TABS
20.0000 mg | ORAL_TABLET | Freq: Once | ORAL | Status: AC
  Administered 2014-06-06: 20 mg via ORAL
  Filled 2014-06-06: qty 1

## 2014-06-06 MED ORDER — SODIUM CHLORIDE 0.9 % IJ SOLN: 3.0000 mL | Freq: Two times a day (BID) | INTRAMUSCULAR | Status: DC

## 2014-06-06 MED ORDER — SODIUM CHLORIDE 0.9 % WEIGHT BASED INFUSION: 1.0000 mL/kg/h | INTRAVENOUS | Status: DC

## 2014-06-06 MED ORDER — GI COCKTAIL ~~LOC~~
30.0000 mL | Freq: Once | ORAL | Status: AC
  Administered 2014-06-06: 30 mL via ORAL
  Filled 2014-06-06: qty 30

## 2014-06-06 MED ORDER — ENSURE ENLIVE PO LIQD
237.0000 mL | Freq: Two times a day (BID) | ORAL | Status: DC
Start: 2014-06-07 — End: 2014-06-08
  Filled 2014-06-06 (×5): qty 237

## 2014-06-06 MED ORDER — ONDANSETRON HCL 4 MG/2ML IJ SOLN: 4.0000 mg | Freq: Four times a day (QID) | INTRAMUSCULAR | Status: DC | PRN

## 2014-06-06 MED ORDER — ASPIRIN EC 81 MG PO TBEC
81.0000 mg | DELAYED_RELEASE_TABLET | Freq: Every day | ORAL | Status: DC
  Filled 2014-06-06: qty 1

## 2014-06-06 MED ORDER — SODIUM CHLORIDE 0.9 % WEIGHT BASED INFUSION
3.0000 mL/kg/h | INTRAVENOUS | Status: DC
  Administered 2014-06-07: 3 mL/kg/h via INTRAVENOUS

## 2014-06-06 MED ORDER — ALBUTEROL SULFATE (2.5 MG/3ML) 0.083% IN NEBU
2.5000 mg | INHALATION_SOLUTION | Freq: Four times a day (QID) | RESPIRATORY_TRACT | Status: DC | PRN
  Administered 2014-06-06 – 2014-06-07 (×2): 2.5 mg via RESPIRATORY_TRACT
  Filled 2014-06-06 (×2): qty 3

## 2014-06-06 MED ORDER — MONTELUKAST SODIUM 10 MG PO TABS
10.0000 mg | ORAL_TABLET | Freq: Every day | ORAL | Status: DC
  Administered 2014-06-06 – 2014-06-08 (×2): 10 mg via ORAL
  Filled 2014-06-06 (×4): qty 1

## 2014-06-06 MED ORDER — SODIUM CHLORIDE 0.9 % IV SOLN: 250.0000 mL | INTRAVENOUS | Status: DC | PRN

## 2014-06-06 MED ORDER — ESCITALOPRAM OXALATE 20 MG PO TABS
20.0000 mg | ORAL_TABLET | Freq: Every day | ORAL | Status: DC
  Administered 2014-06-07 – 2014-06-08 (×2): 20 mg via ORAL
  Filled 2014-06-06 (×3): qty 1

## 2014-06-06 MED ORDER — ATORVASTATIN CALCIUM 80 MG PO TABS
80.0000 mg | ORAL_TABLET | Freq: Every day | ORAL | Status: DC
  Administered 2014-06-06: 80 mg via ORAL
  Filled 2014-06-06 (×2): qty 1

## 2014-06-06 MED ORDER — ALPRAZOLAM 0.5 MG PO TABS
0.5000 mg | ORAL_TABLET | Freq: Two times a day (BID) | ORAL | Status: DC | PRN
  Administered 2014-06-06: 0.25 mg via ORAL
  Filled 2014-06-06: qty 1

## 2014-06-06 MED ORDER — SODIUM CHLORIDE 0.9 % IJ SOLN: 3.0000 mL | INTRAMUSCULAR | Status: DC | PRN

## 2014-06-06 MED ORDER — PANTOPRAZOLE SODIUM 40 MG PO TBEC
40.0000 mg | DELAYED_RELEASE_TABLET | Freq: Every day | ORAL | Status: DC
  Administered 2014-06-07 – 2014-06-08 (×2): 40 mg via ORAL
  Filled 2014-06-06 (×3): qty 1

## 2014-06-06 NOTE — Progress Notes (Signed)
Kristen Price paged due to pt reporting chest pain 1 on scale on 0-10. 1 Nitro given. Pt now reports she is chest pain free. RN will cont to monitor.

## 2014-06-06 NOTE — ED Notes (Signed)
Pt. Is going to x-ray. 

## 2014-06-06 NOTE — ED Notes (Signed)
Critical troponin 0.99 received from lab. EDP aware.

## 2014-06-06 NOTE — ED Notes (Signed)
Ordered Heart Healthy Diet meal tray. 1424

## 2014-06-06 NOTE — ED Notes (Signed)
Cards at bedisde

## 2014-06-06 NOTE — ED Provider Notes (Addendum)
CSN: [id number]     Arrival date & time 06/06/14  1144 History   First MD Initiated Contact with Patient 06/06/14 1207     Chief Complaint  Patient presents with  . Chest Pain     (Consider location/radiation/quality/duration/timing/severity/associated sxs/prior Treatment) Patient is a 39 y.o. female presenting with chest pain. The history is provided by the patient.  Chest Pain Associated symptoms: no abdominal pain, no back pain, no cough, no fever, no headache, no palpitations, no shortness of breath and not vomiting   Patient presents w left cp for the past 2-3 days (was seen in ED 5/29 for same, and saw pcp for same today, and was referred to ED for possible 'gi workup').  Pain is constant. Dull. At rest. States it makes her toss and turn at night when trying to sleep. No associated nv, diaphoresis or sob. Pain is not pleuritic. Pain does radiates up towards left neck and head. States had previously, and resolved, but now persistent for the past few days. No back or flank pain. No exertional cp or unusual doe or fatigue. +fam hx cad at early age, parents. Denies cough or uri c/o. No fever or chills. Denies chest wall injury or strain. No hemoptysis. Denies recent surgery, immobility, trauma or travel. Non smoker, was a former smoker. No leg pain or swelling. Denies heartburn or hx gerd.       Past Medical History  Diagnosis Date  . Hiatal hernia   . Hypertension   . Back ache    Past Surgical History  Procedure Laterality Date  . Fracture surgery     History reviewed. No pertinent family history. History  Substance Use Topics  . Smoking status: Former Smoker -- 1.00 packs/day for 25 years    Types: Cigarettes    Quit date: 06/06/2010  . Smokeless tobacco: Not on file  . Alcohol Use: Yes     Comment: occasionally   OB History    No data available     Review of Systems  Constitutional: Negative for fever and chills.  HENT: Negative for sore throat.   Eyes: Negative for  redness.  Respiratory: Negative for cough and shortness of breath.   Cardiovascular: Positive for chest pain. Negative for palpitations and leg swelling.  Gastrointestinal: Negative for vomiting, abdominal pain and diarrhea.  Endocrine: Negative for polyuria.  Genitourinary: Negative for dysuria and flank pain.  Musculoskeletal: Negative for back pain and neck pain.  Skin: Negative for rash.  Neurological: Negative for headaches.  Hematological: Does not bruise/bleed easily.  Psychiatric/Behavioral: Negative for confusion.      Allergies  Augmentin; Benadryl; and Penicillins  Home Medications   Prior to Admission medications   Medication Sig Start Date End Date Taking? Authorizing Provider  albuterol (PROVENTIL HFA;VENTOLIN HFA) 108 (90 BASE) MCG/ACT inhaler Inhale 2 puffs into the lungs every 6 (six) hours as needed for wheezing or shortness of breath.    Historical Provider, MD  alprazolam Duanne Moron) 2 MG tablet Take 0.5-2 mg by mouth 2 (two) times daily as needed for sleep or anxiety.    Historical Provider, MD  escitalopram (LEXAPRO) 20 MG tablet Take 20 mg by mouth daily.    Historical Provider, MD  etonogestrel (NEXPLANON) 68 MG IMPL implant Inject 1 each into the skin once.    Historical Provider, MD  HYDROcodone-acetaminophen (NORCO) 10-325 MG per tablet Take 1 tablet by mouth every 6 (six) hours as needed for moderate pain.    Historical Provider, MD  ipratropium-albuterol (  DUONEB) 0.5-2.5 (3) MG/3ML SOLN Take 1.5 mLs by nebulization 3 (three) times daily as needed (shortness of breath).     Historical Provider, MD  methocarbamol (ROBAXIN) 750 MG tablet Take 750 mg by mouth every 6 (six) hours as needed for muscle spasms.    Historical Provider, MD  metoprolol (LOPRESSOR) 50 MG tablet Take 50 mg by mouth 2 (two) times daily.    Historical Provider, MD  miconazole (MONISTAT-3) KIT Place 1 kit vaginally at bedtime as needed (yeast infection).     Historical Provider, MD   montelukast (SINGULAIR) 10 MG tablet Take 10 mg by mouth at bedtime.    Historical Provider, MD  omeprazole (PRILOSEC) 40 MG capsule Take 40 mg by mouth daily.    Historical Provider, MD   BP 100/64 mmHg  Pulse 66  Resp 9  Ht $R'5\' 7"'ea$  (1.702 m)  Wt 194 lb (87.998 kg)  BMI 30.38 kg/m2  SpO2 97% Physical Exam  Constitutional: She appears well-developed and well-nourished. No distress.  HENT:  Mouth/Throat: Oropharynx is clear and moist.  Eyes: Conjunctivae are normal. No scleral icterus.  Neck: Neck supple. No tracheal deviation present.  Cardiovascular: Normal rate, regular rhythm, normal heart sounds and intact distal pulses.  Exam reveals no gallop and no friction rub.   No murmur heard. Pulmonary/Chest: Effort normal and breath sounds normal. No respiratory distress.  Abdominal: Soft. Normal appearance and bowel sounds are normal. She exhibits no distension and no mass. There is no tenderness. There is no rebound and no guarding.  Musculoskeletal: She exhibits no edema or tenderness.  Neurological: She is alert.  Skin: Skin is warm and dry. No rash noted. She is not diaphoretic.  Psychiatric: She has a normal mood and affect.  Nursing note and vitals reviewed.   ED Course  Procedures (including critical care time) Labs Review   Results for orders placed or performed during the hospital encounter of 06/06/14  CBC  Result Value Ref Range   WBC 12.8 (H) 4.0 - 10.5 K/uL   RBC 4.31 3.87 - 5.11 MIL/uL   Hemoglobin 13.5 12.0 - 15.0 g/dL   HCT 40.1 36.0 - 46.0 %   MCV 93.0 78.0 - 100.0 fL   MCH 31.3 26.0 - 34.0 pg   MCHC 33.7 30.0 - 36.0 g/dL   RDW 13.3 11.5 - 15.5 %   Platelets 222 150 - 400 K/uL  Comprehensive metabolic panel  Result Value Ref Range   Sodium 138 135 - 145 mmol/L   Potassium 4.2 3.5 - 5.1 mmol/L   Chloride 107 101 - 111 mmol/L   CO2 25 22 - 32 mmol/L   Glucose, Bld 115 (H) 65 - 99 mg/dL   BUN 9 6 - 20 mg/dL   Creatinine, Ser 0.76 0.44 - 1.00 mg/dL    Calcium 8.9 8.9 - 10.3 mg/dL   Total Protein 6.1 (L) 6.5 - 8.1 g/dL   Albumin 3.5 3.5 - 5.0 g/dL   AST 30 15 - 41 U/L   ALT 30 14 - 54 U/L   Alkaline Phosphatase 58 38 - 126 U/L   Total Bilirubin 0.4 0.3 - 1.2 mg/dL   GFR calc non Af Amer >60 >60 mL/min   GFR calc Af Amer >60 >60 mL/min   Anion gap 6 5 - 15  Lipase, blood  Result Value Ref Range   Lipase 19 (L) 22 - 51 U/L  Troponin I  Result Value Ref Range   Troponin I 0.99 (HH) <0.031 ng/mL  Dg Chest Port 1 View  06/02/2014   CLINICAL DATA:  Chest pain, anxiety.  EXAM: PORTABLE CHEST - 1 VIEW  COMPARISON:  12/13/2007  FINDINGS: The cardiomediastinal contours are normal. The lungs are clear. Pulmonary vasculature is normal. No consolidation, pleural effusion, or pneumothorax. No acute osseous abnormalities are seen.  IMPRESSION: No acute pulmonary process.   Electronically Signed   By: Jeb Levering M.D.   On: 06/02/2014 23:45       Imaging Review No results found.   EKG Interpretation   Date/Time:  Thursday June 06 2014 11:47:55 EDT Ventricular Rate:  63 PR Interval:  145 QRS Duration: 87 QT Interval:  429 QTC Calculation: 439 R Axis:   71 Text Interpretation:  Sinus rhythm Nonspecific T wave abnormality  Confirmed by Ashok Cordia  MD, Elizar Alpern ([id number]) on 06/06/2014 12:08:32 PM      MDM   Iv ns. Labs. Ecg. Cxr.  Will try gi cocktail and pepcid for symptom relief.  Reviewed nursing notes and prior charts for additional history.   Troponin returns elevated. Repeat ecg, no acute st/t changes.   Asa po.  Cardiology called re admit.  Recheck pt feels improved from prior.     Lajean Saver, MD 06/06/14 1325

## 2014-06-06 NOTE — ED Notes (Signed)
Pt. Is back from x-ray

## 2014-06-06 NOTE — Progress Notes (Signed)
ANTICOAGULATION CONSULT NOTE - Follow Up Consult  Pharmacy Consult for heparin Indication: chest pain/ACS/NSTEMI  Allergies  Allergen Reactions  . Augmentin [Amoxicillin-Pot Clavulanate] Nausea And Vomiting  . Benadryl [Diphenhydramine] Other (See Comments)    Patient stated that it makes her "go nuts"  . Penicillins Nausea And Vomiting    Patient Measurements: Height: 5\' 7"  (170.2 cm) Weight: 194 lb (87.998 kg) IBW/kg (Calculated) : 61.6 Heparin Dosing Weight: 80.3  Vital Signs: Temp: 98.4 F (36.9 C) (06/02 2036) Temp Source: Oral (06/02 2036) BP: 105/59 mmHg (06/02 2139) Pulse Rate: 73 (06/02 2139)  Labs:  Recent Labs  06/06/14 1217 06/06/14 2115  HGB 13.5  --   HCT 40.1  --   PLT 222  --   HEPARINUNFRC  --  0.41  CREATININE 0.76  --   TROPONINI 0.99*  --     Estimated Creatinine Clearance: 107.6 mL/min (by C-G formula based on Cr of 0.76).   Medical History: Past Medical History  Diagnosis Date  . Hiatal hernia   . Hypertension   . NSTEMI (non-ST elevated myocardial infarction) 06/06/2014  . Family history of adverse reaction to anesthesia     "mom didn't want to wake up at all"  . Asthma   . GERD (gastroesophageal reflux disease)   . Chronic lower back pain   . Anxiety   . Depression     Assessment: [age] yof with strong family history of CAD admitted with CP. Pharmacy consulted to start IV heparin for ACS/NSTEMI. For LHC 6/3 in AM per cards. CBC wnl. No bleed documented.  Heparin drip 1000 uts/ hr HL 0.41 at goal.  Goal of Therapy:  Heparin level 0.3-0.7 units/ml Monitor platelets by anticoagulation protocol: Yes   Plan:   Continue Heparin drip @1000  units/hr Daily HL/CBC Mon s/sx bleed  Leota SauersLisa Ellagrace Yoshida Pharm.D. CPP, BCPS Clinical Pharmacist [phone] 06/06/2014 10:33 PM

## 2014-06-06 NOTE — Progress Notes (Signed)
Pt was given 0.25mg  of Xanax and asked RN for additional 0.25mg  because she takes .5mg  at home.  Dr. Nori RiisAlavarez paged. New order received for .5mg  Xanax.  .5mg  Xanax pulled from Pyxis. Pt given half of .5mg  of Xanax. 0.25mg  of Xanax wasted in sharps witnessed by Jennelle Humanherise D, RN.   RN will cont to monitor.

## 2014-06-06 NOTE — H&P (Signed)
Patient ID: Kristen Price MRN: [id number], DOB/AGE: August 24, 1975   Admit date: 06/06/2014   Primary Physician: Delia Chimes, NP Primary Cardiologist: New (Dr. Angelena Form)  Pt. Profile:  39 year old female with history of tobacco abuse and strong family history of CAD presenting to the emergency department with chest pain. She has ruled in for non-ST elevation myocardial infarction with elevated troponin.  Problem List  Past Medical History  Diagnosis Date  . Hiatal hernia   . Hypertension   . Back ache     Past Surgical History  Procedure Laterality Date  . Fracture surgery       Allergies  Allergies  Allergen Reactions  . Augmentin [Amoxicillin-Pot Clavulanate] Nausea And Vomiting  . Benadryl [Diphenhydramine] Other (See Comments)    Patient stated that it makes her "go nuts"  . Penicillins Nausea And Vomiting    HPI  The patient is a 39 year old Caucasian female with no prior cardiac history presenting with complaints of chest pain. Her past medical history is significant for tobacco abuse, smoking one pack per day, asthma, and GERD with a hiatal hernia. She also has a strong family history of cardiovascular disease. Both her mother and father suffered myocardial infarctions in their 67s. She denies any history of hypertension, hyperlipidemia or diabetes.  2 weeks ago she developed resting substernal chest discomfort. Initially was mild and only lasted several minutes. She he initially thought it was possibly her acid reflux. However, over the last week her symptoms worsen. She presented to the [facility] emergency department 2 nights ago with similar complaints. She reports  cardiac enzymes were checked and she was told that her laboratory work was normal and she was discharged home from the emergency department. She presents back to the ED today with complaints of increasing pain. Substernal chest pain that radiates to her left upper extremity, back, left neck and jaw.  Described as a sharp/stabbing pain. Occurs at rest. Has awoken her from her sleep. Not necessarily exacerbated by exertion. No worsening or alleviating factors. 10 out of 10 in maximum intensity. She notes associated dyspnea, nausea, vomiting, dizziness and near-syncope. She took one sublingual nitroglycerin at home without relief. She is now chest pain-free in the ED. Initial troponin is elevated at 0.99. EKG shows normal sinus rhythm without any ischemic abnormalities. Chest x-ray unremarkable.     Home Medications  Prior to Admission medications   Medication Sig Start Date End Date Taking? Authorizing Provider  albuterol (PROVENTIL HFA;VENTOLIN HFA) 108 (90 BASE) MCG/ACT inhaler Inhale 2 puffs into the lungs every 6 (six) hours as needed for wheezing or shortness of breath.    Historical Provider, MD  alprazolam Duanne Moron) 2 MG tablet Take 0.5-2 mg by mouth 2 (two) times daily as needed for sleep or anxiety.    Historical Provider, MD  escitalopram (LEXAPRO) 20 MG tablet Take 20 mg by mouth daily.    Historical Provider, MD  etonogestrel (NEXPLANON) 68 MG IMPL implant Inject 1 each into the skin once.    Historical Provider, MD  HYDROcodone-acetaminophen (NORCO) 10-325 MG per tablet Take 1 tablet by mouth every 6 (six) hours as needed for moderate pain.    Historical Provider, MD  ipratropium-albuterol (DUONEB) 0.5-2.5 (3) MG/3ML SOLN Take 1.5 mLs by nebulization 3 (three) times daily as needed (shortness of breath).     Historical Provider, MD  methocarbamol (ROBAXIN) 750 MG tablet Take 750 mg by mouth every 6 (six) hours as needed for muscle spasms.    Historical Provider, MD  metoprolol (LOPRESSOR) 50 MG tablet Take 50 mg by mouth 2 (two) times daily.    Historical Provider, MD  miconazole (MONISTAT-3) KIT Place 1 kit vaginally at bedtime as needed (yeast infection).     Historical Provider, MD  montelukast (SINGULAIR) 10 MG tablet Take 10 mg by mouth at bedtime.    Historical Provider, MD    omeprazole (PRILOSEC) 40 MG capsule Take 40 mg by mouth daily.    Historical Provider, MD    Family History  Family History  Problem Relation Age of Onset  . Coronary artery disease Father 49    MI  . Coronary artery disease Mother 22    MI    Social History  History   Social History  . Marital Status: Married    Spouse Name: N/A  . Number of Children: N/A  . Years of Education: N/A   Occupational History  . Not on file.   Social History Main Topics  . Smoking status: Former Smoker -- 1.00 packs/day for 25 years    Types: Cigarettes    Quit date: 06/06/2010  . Smokeless tobacco: Not on file  . Alcohol Use: Yes     Comment: occasionally  . Drug Use: No  . Sexual Activity: Yes    Birth Control/ Protection: Implant   Other Topics Concern  . Not on file   Social History Narrative     Review of Systems General:  No chills, fever, night sweats or weight changes.  Cardiovascular:  + chest pain, dyspnea on exertion, edema, orthopnea, palpitations, paroxysmal nocturnal dyspnea. Dermatological: No rash, lesions/masses Respiratory: No cough, + dyspnea Urologic: No hematuria, dysuria Abdominal:   + nausea, + vomiting, no diarrhea, bright red blood per rectum, melena, or hematemesis Neurologic:  No visual changes, wkns, changes in mental status. All other systems reviewed and are otherwise negative except as noted above.  Physical Exam  Blood pressure 97/56, pulse 62, resp. rate 21, height 5' 7"  (1.702 m), weight 194 lb (87.998 kg), SpO2 95 %.  General: Pleasant, NAD Psych: Normal affect. Neuro: Alert and oriented X 3. Moves all extremities spontaneously. HEENT: Normal  Neck: Supple without bruits or JVD. Lungs:  Resp regular and unlabored, CTA. Heart: RRR no s3, s4, or murmurs. Abdomen: Soft, non-tender, non-distended, BS + x 4.  Extremities: No clubbing, cyanosis or edema. DP/PT/Radials 2+ and equal bilaterally.  Labs  Troponin (Point of Care Test) No  results for input(s): TROPIPOC in the last 72 hours.  Recent Labs  06/06/14 1217  TROPONINI 0.99*   Lab Results  Component Value Date   WBC 12.8* 06/06/2014   HGB 13.5 06/06/2014   HCT 40.1 06/06/2014   MCV 93.0 06/06/2014   PLT 222 06/06/2014     Recent Labs Lab 06/06/14 1217  NA 138  K 4.2  CL 107  CO2 25  BUN 9  CREATININE 0.76  CALCIUM 8.9  PROT 6.1*  BILITOT 0.4  ALKPHOS 58  ALT 30  AST 30  GLUCOSE 115*   No results found for: CHOL, HDL, LDLCALC, TRIG No results found for: DDIMER   Radiology/Studies  Dg Chest 2 View  06/06/2014   CLINICAL DATA:  Two week history of left anterior chest pain  EXAM: CHEST  2 VIEW  COMPARISON:  Jun 02, 2014  FINDINGS: Lungs are clear. Heart size and pulmonary vascularity are normal. No adenopathy. No pneumothorax. No bone lesions.  IMPRESSION: No abnormality noted.   Electronically Signed   By: Lowella Grip  III M.D.   On: 06/06/2014 13:50   Dg Chest Port 1 View  06/02/2014   CLINICAL DATA:  Chest pain, anxiety.  EXAM: PORTABLE CHEST - 1 VIEW  COMPARISON:  12/13/2007  FINDINGS: The cardiomediastinal contours are normal. The lungs are clear. Pulmonary vasculature is normal. No consolidation, pleural effusion, or pneumothorax. No acute osseous abnormalities are seen.  IMPRESSION: No acute pulmonary process.   Electronically Signed   By: Jeb Levering M.D.   On: 06/02/2014 23:45    ECG  NSR no ischemic abnormalities    ASSESSMENT AND PLAN  Principal Problem:   NSTEMI (non-ST elevated myocardial infarction) Active Problems:   Tobacco abuse    1. Non-ST elevation myocardial infarction:  h/o intermittent chest pain and now elevated troponin at 0.99. She is currently chest pain-free. We will admit to telemetry and will continue to cycle cardiac enzymes 3 to assess trend. We'll plan for left heart catheterization in the a.m. Nothing by mouth at midnight. We will start on daily aspirin and high-dose statin. We'll hold  initiation of beta blocker due to soft blood pressure. Will place on IV heparin and low-dose IV nitroglycerin. We'll monitor blood pressure closely and will discontinue IV nitroglycerin if patient becomes hypotensive. We'll check a fasting lipid panel in the a.m. as well as a hemoglobin A1c.   2. Tobacco abuse: Smoking cessation strongly advised    Signed, Lyda Jester, PA-C 06/06/2014, 2:14 PM   I have personally seen and examined this patient with Lyda Jester, PA-C. I agree with the assessment and plan as outlined above. Kristen Price is a 39 yo female with HTN and tobacco abuse. She is being admitted with classic story of unstable angina with elevated troponin. Chest pain free at this time. If she remains pain free, will plan cath tomorrow. Will start IV heparin, IV NTG, statin, ASA. If chest pain recurs, will consider more urgent cardiac cath today. Risks and benefits of cath reviewed with pt.   Vylette Strubel 06/06/2014 2:31 PM

## 2014-06-06 NOTE — ED Notes (Signed)
Order Heart Healthy Diet meal tray. 1424

## 2014-06-06 NOTE — ED Notes (Signed)
Pt here via EMS for eval of stabbing CP starting 2 weeks ago. Pt was here 3 days ago for same, unsure of diagnosis. Pt was at PCP this am, given toradol, sent here for possible GI workup. No GI symptoms reported. No pain at this time.

## 2014-06-06 NOTE — Progress Notes (Signed)
ANTICOAGULATION CONSULT NOTE - Initial Consult  Pharmacy Consult for heparin Indication: chest pain/ACS/NSTEMI  Allergies  Allergen Reactions  . Augmentin [Amoxicillin-Pot Clavulanate] Nausea And Vomiting  . Benadryl [Diphenhydramine] Other (See Comments)    Patient stated that it makes her "go nuts"  . Penicillins Nausea And Vomiting    Patient Measurements: Height: 5\' 7"  (170.2 cm) Weight: 194 lb (87.998 kg) IBW/kg (Calculated) : 61.6 Heparin Dosing Weight: 80.3  Vital Signs: BP: 94/52 mmHg (06/02 1430) Pulse Rate: 66 (06/02 1430)  Labs:  Recent Labs  06/06/14 1217  HGB 13.5  HCT 40.1  PLT 222  CREATININE 0.76  TROPONINI 0.99*    Estimated Creatinine Clearance: 107.6 mL/min (by C-G formula based on Cr of 0.76).   Medical History: Past Medical History  Diagnosis Date  . Hiatal hernia   . Hypertension   . Back ache     Assessment: [age] yof with strong family history of CAD admitted with CP. Pharmacy consulted to start IV heparin for ACS/NSTEMI. For LHC 6/3 in AM per cards. CBC wnl. No bleed documented.  Goal of Therapy:  Heparin level 0.3-0.7 units/ml Monitor platelets by anticoagulation protocol: Yes   Plan:  Heparin 4000 unit bolus Heparin IV @1000  units/hr 6h HL Daily HL/CBC Mon s/sx bleed  Babs BertinHaley Robie Mcniel, PharmD Clinical Pharmacist - Resident Pager [phone] 06/06/2014 3:26 PM

## 2014-06-06 NOTE — ED Notes (Signed)
Attempted report 

## 2014-06-07 ENCOUNTER — Encounter (HOSPITAL_COMMUNITY)
Admission: EM | Disposition: A | Discharge status: Home / Self Care | Payer: Self-pay | Source: Home / Self Care | Attending: Cardiovascular Disease

## 2014-06-07 ENCOUNTER — Inpatient Hospital Stay (HOSPITAL_COMMUNITY): Payer: BLUE CROSS/BLUE SHIELD

## 2014-06-07 DIAGNOSIS — I2510 Atherosclerotic heart disease of native coronary artery without angina pectoris: Secondary | ICD-10-CM

## 2014-06-07 DIAGNOSIS — R778 Other specified abnormalities of plasma proteins: Secondary | ICD-10-CM | POA: Insufficient documentation

## 2014-06-07 DIAGNOSIS — R079 Chest pain, unspecified: Secondary | ICD-10-CM

## 2014-06-07 DIAGNOSIS — R7989 Other specified abnormal findings of blood chemistry: Secondary | ICD-10-CM

## 2014-06-07 HISTORY — PX: CARDIAC CATHETERIZATION: SHX172

## 2014-06-07 LAB — BASIC METABOLIC PANEL
ANION GAP: 8 (ref 5–15)
BUN: 9 mg/dL (ref 6–20)
CHLORIDE: 105 mmol/L (ref 101–111)
CO2: 25 mmol/L (ref 22–32)
CREATININE: 0.77 mg/dL (ref 0.44–1.00)
Calcium: 8.6 mg/dL — ABNORMAL LOW (ref 8.9–10.3)
GFR calc Af Amer: >60 mL/min (ref >60)
GFR calc non Af Amer: >60 mL/min (ref >60)
Glucose, Bld: 123 mg/dL — ABNORMAL HIGH (ref 65–99)
Potassium: 3.6 mmol/L (ref 3.5–5.1)
SODIUM: 138 mmol/L (ref 135–145)

## 2014-06-07 LAB — CBC
HCT: 38.9 % (ref 36.0–46.0)
HEMOGLOBIN: 13 g/dL (ref 12.0–15.0)
MCH: 31 pg (ref 26.0–34.0)
MCHC: 33.4 g/dL (ref 30.0–36.0)
MCV: 92.8 fL (ref 78.0–100.0)
Platelets: 204 10*3/uL (ref 150–400)
RBC: 4.19 MIL/uL (ref 3.87–5.11)
RDW: 13.5 % (ref 11.5–15.5)
WBC: 10.6 10*3/uL — ABNORMAL HIGH (ref 4.0–10.5)

## 2014-06-07 LAB — LIPID PANEL
CHOLESTEROL: 180 mg/dL (ref 0–200)
HDL: 24 mg/dL — ABNORMAL LOW (ref >40)
LDL Cholesterol: 128 mg/dL — ABNORMAL HIGH (ref 0–99)
Total CHOL/HDL Ratio: 7.5 RATIO
Triglycerides: 139 mg/dL (ref <150)
VLDL: 28 mg/dL (ref 0–40)

## 2014-06-07 LAB — HEPARIN LEVEL (UNFRACTIONATED): HEPARIN UNFRACTIONATED: 0.25 [IU]/mL — AB (ref 0.30–0.70)

## 2014-06-07 LAB — PROTIME-INR
INR: 1.05 (ref 0.00–1.49)
PROTHROMBIN TIME: 13.9 s (ref 11.6–15.2)

## 2014-06-07 LAB — POCT ACTIVATED CLOTTING TIME
Activated Clotting Time: 196 seconds
Activated Clotting Time: 393 seconds

## 2014-06-07 LAB — TROPONIN I: TROPONIN I: 7.02 ng/mL — AB (ref <0.031)

## 2014-06-07 SURGERY — LEFT HEART CATH AND CORONARY ANGIOGRAPHY
Anesthesia: LOCAL

## 2014-06-07 MED ORDER — TICAGRELOR 90 MG PO TABS
ORAL_TABLET | ORAL | Status: DC | PRN
  Administered 2014-06-07: 180 mg via ORAL

## 2014-06-07 MED ORDER — SODIUM CHLORIDE 0.9 % IV SOLN
250.0000 mg | INTRAVENOUS | Status: DC | PRN
  Administered 2014-06-07: 1.75 mg/kg/h via INTRAVENOUS

## 2014-06-07 MED ORDER — HEPARIN SODIUM (PORCINE) 1000 UNIT/ML IJ SOLN
INTRAMUSCULAR | Status: AC
  Filled 2014-06-07: qty 1

## 2014-06-07 MED ORDER — BIVALIRUDIN BOLUS VIA INFUSION - CUPID
INTRAVENOUS | Status: DC | PRN
Start: 2014-06-07 — End: 2014-06-07
  Administered 2014-06-07: 66 mg via INTRAVENOUS

## 2014-06-07 MED ORDER — SODIUM CHLORIDE 0.9 % IV SOLN: 250.0000 mL | INTRAVENOUS | Status: DC | PRN

## 2014-06-07 MED ORDER — HEPARIN SODIUM (PORCINE) 1000 UNIT/ML IJ SOLN
INTRAMUSCULAR | Status: DC | PRN
  Administered 2014-06-07: 3000 [IU] via INTRAVENOUS

## 2014-06-07 MED ORDER — TICAGRELOR 90 MG PO TABS
90.0000 mg | ORAL_TABLET | Freq: Two times a day (BID) | ORAL | Status: DC
  Administered 2014-06-08 (×2): 90 mg via ORAL
  Filled 2014-06-07 (×2): qty 1

## 2014-06-07 MED ORDER — TICAGRELOR 90 MG PO TABS
ORAL_TABLET | ORAL | Status: AC
  Filled 2014-06-07: qty 1

## 2014-06-07 MED ORDER — SODIUM CHLORIDE 0.9 % IJ SOLN: 3.0000 mL | Freq: Two times a day (BID) | INTRAMUSCULAR | Status: DC

## 2014-06-07 MED ORDER — FENTANYL CITRATE (PF) 100 MCG/2ML IJ SOLN
INTRAMUSCULAR | Status: DC | PRN
  Administered 2014-06-07 (×4): 25 ug via INTRAVENOUS
  Administered 2014-06-07: 50 ug via INTRAVENOUS

## 2014-06-07 MED ORDER — FENTANYL CITRATE (PF) 100 MCG/2ML IJ SOLN
INTRAMUSCULAR | Status: AC
  Filled 2014-06-07: qty 2

## 2014-06-07 MED ORDER — ONDANSETRON HCL 4 MG/2ML IJ SOLN: 4.0000 mg | Freq: Four times a day (QID) | INTRAMUSCULAR | Status: DC | PRN

## 2014-06-07 MED ORDER — NITROGLYCERIN 1 MG/10 ML FOR IR/CATH LAB
INTRA_ARTERIAL | Status: AC
  Filled 2014-06-07: qty 10

## 2014-06-07 MED ORDER — ATORVASTATIN CALCIUM 80 MG PO TABS
80.0000 mg | ORAL_TABLET | Freq: Every day | ORAL | Status: DC
  Administered 2014-06-07: 80 mg via ORAL
  Filled 2014-06-07: qty 1

## 2014-06-07 MED ORDER — HEPARIN (PORCINE) IN NACL 2-0.9 UNIT/ML-% IJ SOLN
INTRAMUSCULAR | Status: AC
  Filled 2014-06-07: qty 1500

## 2014-06-07 MED ORDER — SODIUM CHLORIDE 0.9 % IJ SOLN
3.0000 mL | INTRAMUSCULAR | Status: DC | PRN
Start: 2014-06-07 — End: 2014-06-07

## 2014-06-07 MED ORDER — ACETAMINOPHEN 325 MG PO TABS
650.0000 mg | ORAL_TABLET | ORAL | Status: DC | PRN
  Administered 2014-06-08: 650 mg via ORAL
  Filled 2014-06-07: qty 2

## 2014-06-07 MED ORDER — MIDAZOLAM HCL 2 MG/2ML IJ SOLN
INTRAMUSCULAR | Status: AC
  Filled 2014-06-07: qty 2

## 2014-06-07 MED ORDER — ASPIRIN EC 81 MG PO TBEC
81.0000 mg | DELAYED_RELEASE_TABLET | Freq: Every day | ORAL | Status: DC
  Administered 2014-06-08: 81 mg via ORAL
  Filled 2014-06-07: qty 1

## 2014-06-07 MED ORDER — VERAPAMIL HCL 2.5 MG/ML IV SOLN
INTRAVENOUS | Status: DC | PRN
  Administered 2014-06-07: 11:00:00 via INTRA_ARTERIAL

## 2014-06-07 MED ORDER — LIDOCAINE HCL (PF) 1 % IJ SOLN
INTRAMUSCULAR | Status: AC
  Filled 2014-06-07: qty 30

## 2014-06-07 MED ORDER — VERAPAMIL HCL 2.5 MG/ML IV SOLN
INTRAVENOUS | Status: AC
  Filled 2014-06-07: qty 2

## 2014-06-07 MED ORDER — SODIUM CHLORIDE 0.9 % IV SOLN
INTRAVENOUS | Status: AC
Start: 2014-06-07 — End: 2014-06-07
  Administered 2014-06-07: 15:00:00 via INTRAVENOUS

## 2014-06-07 MED ORDER — ZOLPIDEM TARTRATE 5 MG PO TABS: 5.0000 mg | ORAL_TABLET | Freq: Every evening | ORAL | Status: DC | PRN

## 2014-06-07 MED ORDER — ALPRAZOLAM 0.5 MG PO TABS
1.0000 mg | ORAL_TABLET | Freq: Two times a day (BID) | ORAL | Status: DC | PRN
  Administered 2014-06-07: 22:00:00 1 mg via ORAL
  Filled 2014-06-07: qty 2

## 2014-06-07 MED ORDER — NITROGLYCERIN 1 MG/10 ML FOR IR/CATH LAB
INTRA_ARTERIAL | Status: DC | PRN
  Administered 2014-06-07: 150 ug via INTRACORONARY
  Administered 2014-06-07 (×2): 200 ug via INTRACORONARY

## 2014-06-07 MED ORDER — IPRATROPIUM-ALBUTEROL 0.5-2.5 (3) MG/3ML IN SOLN
1.5000 mL | Freq: Three times a day (TID) | RESPIRATORY_TRACT | Status: DC | PRN
  Administered 2014-06-07: 20:00:00 1.5 mL via RESPIRATORY_TRACT
  Administered 2014-06-08: 3 mL via RESPIRATORY_TRACT
  Filled 2014-06-07 (×2): qty 3

## 2014-06-07 MED ORDER — MIDAZOLAM HCL 2 MG/2ML IJ SOLN
INTRAMUSCULAR | Status: DC | PRN
  Administered 2014-06-07: 2 mg via INTRAVENOUS
  Administered 2014-06-07 (×3): 1 mg via INTRAVENOUS

## 2014-06-07 MED ORDER — BIVALIRUDIN 250 MG IV SOLR
INTRAVENOUS | Status: AC
  Filled 2014-06-07: qty 250

## 2014-06-07 SURGICAL SUPPLY — 19 items
BALLN TREK RX 2.5X12 (BALLOONS) ×4
BALLN ~~LOC~~ EUPHORA RX 3.75X15 (BALLOONS) ×4
BALLOON TREK RX 2.5X12 (BALLOONS) ×2 IMPLANT
BALLOON ~~LOC~~ EUPHORA RX 3.75X15 (BALLOONS) ×2 IMPLANT
CATH INFINITI 5FR ANG PIGTAIL (CATHETERS) ×4 IMPLANT
CATH OPTITORQUE TIG 4.0 5F (CATHETERS) ×4 IMPLANT
CATH VISTA GUIDE 6FR XB3.5 (CATHETERS) ×4 IMPLANT
DEVICE RAD COMP TR BAND LRG (VASCULAR PRODUCTS) ×4 IMPLANT
GLIDESHEATH SLEND A-KIT 6F 22G (SHEATH) ×4 IMPLANT
KIT ENCORE 26 ADVANTAGE (KITS) ×4 IMPLANT
KIT HEART LEFT (KITS) ×4 IMPLANT
PACK CARDIAC CATHETERIZATION (CUSTOM PROCEDURE TRAY) ×4 IMPLANT
STENT RESOLUTE INTEG 3.5X18 (Permanent Stent) ×4 IMPLANT
SYR MEDRAD MARK V 150ML (SYRINGE) ×4 IMPLANT
TRANSDUCER W/STOPCOCK (MISCELLANEOUS) ×4 IMPLANT
TUBING CIL FLEX 10 FLL-RA (TUBING) ×4 IMPLANT
WIRE ASAHI PROWATER 180CM (WIRE) ×4 IMPLANT
WIRE HI TORQ VERSACORE-J 145CM (WIRE) ×4 IMPLANT
WIRE SAFE-T 1.5MM-J .035X260CM (WIRE) ×4 IMPLANT

## 2014-06-07 NOTE — Progress Notes (Signed)
RN received called from lab for critical Troponin 7.02. Dr.Alvarez notified and made aware. Pt reports she is chest pain free at this time. NSR on monitor. RN will cont to monitor pt.

## 2014-06-07 NOTE — Research (Signed)
Based on the angiographic Inclusion criteria this patient did not meet the requirements for the TWILIGHT Research Trial.

## 2014-06-07 NOTE — Progress Notes (Signed)
CM provided pt with Brilinta pamphlet with 30 day free card and copay card. Pt informed CM  that she uses [facility] on [phone]. Pharmacy called  per CM  and confirmed  medication is in stock. No other needs identified per CM @ present time. Gae Gallopngela Azalya Galyon RN,BSN,CM [phone]

## 2014-06-07 NOTE — Progress Notes (Signed)
Nutrition Brief Note  Patient identified on the Malnutrition Screening Tool (MST) Report.  Wt Readings from Last 15 Encounters:  06/06/14 194 lb (87.998 kg)  06/02/14 194 lb (87.998 kg)  11/10/12 238 lb (107.956 kg)    Body mass index is 30.38 kg/(m^2). Patient meets criteria for Obesity Class I based on current BMI.   Pt currently in CATH LAB.  Diet order is NPO.  Labs and medications reviewed.   No nutrition interventions warranted at this time. If nutrition issues arise, please consult RD.   Maureen ChattersKatie Autumnrose Yore, RD, LDN Pager #: [phone] After-Hours Pager #: [phone]

## 2014-06-07 NOTE — Progress Notes (Signed)
Have been attempting to get respiratory to give a neb treatment for in excess of two hours.  Was going to give it myself, but respiratory has not stocked us with the equipment needed.  I informed them of this on the last call, and they came to the floor without equipment to administer the med, and have now left again to retrieve it.  Patient very anxious about the situation.

## 2014-06-07 NOTE — Progress Notes (Signed)
ANTICOAGULATION CONSULT NOTE - Follow Up Consult  Pharmacy Consult for heparin  Indication: NSTEMI  Allergies  Allergen Reactions  . Augmentin [Amoxicillin-Pot Clavulanate] Nausea And Vomiting  . Benadryl [Diphenhydramine] Other (See Comments)    Patient stated that it makes her "go nuts"  . Penicillins Nausea And Vomiting    Patient Measurements: Height: 5\' 7"  (170.2 cm) Weight: 194 lb (87.998 kg) IBW/kg (Calculated) : 61.6 Heparin Dosing Weight: 80.3  Vital Signs: Temp: 98.8 F (37.1 C) (06/03 0506) Temp Source: Oral (06/03 0506) BP: 133/81 mmHg (06/03 0506) Pulse Rate: 75 (06/03 0506)  Labs:  Recent Labs  06/06/14 1217 06/06/14 2115 06/06/14 2218 06/07/14 0232 06/07/14 0505  HGB 13.5  --   --   --   --   HCT 40.1  --   --   --   --   PLT 222  --   --   --   --   LABPROT  --   --   --   --  13.9  INR  --   --   --   --  1.05  HEPARINUNFRC  --  0.41  --   --  0.25*  CREATININE 0.76  --   --   --   --   TROPONINI 0.99*  --  0.99* 7.02*  --     Estimated Creatinine Clearance: 107.6 mL/min (by C-G formula based on Cr of 0.76).   Assessment: 38 yof on heparin for NSTEMI. Plan for cath this morning. Heparin level 0.25 (subtherapeutic) on 1000 units/hr. No bleeding noted or issues per RN.   Goal of Therapy:  Heparin level 0.3-0.7 units/ml Monitor platelets by anticoagulation protocol: Yes   Plan:  Increase heparin drip to 1150 units/hr F/u post cath  Christoper Fabianaron Roniesha Hollingshead, PharmD, BCPS Clinical pharmacist, pager [phone] 06/07/2014 5:57 AM

## 2014-06-07 NOTE — Care Management Note (Signed)
Case Management Note  Patient Details  Name: Cleotis NipperVelvet Simm MRN: [id number] Date of Birth: [birth year]  Subjective/Objective:   Pt was admitted with Non Stemi            Action/Plan:  Pt will go to cath lab 06/07/14.  CM will assess to see if pt has PCP and medication assistance needs.  CM will continue to monitor for disposition plan   Expected Discharge Date:                  Expected Discharge Plan:  Home/Self Care  In-House Referral:     Discharge planning Services  CM Consult  Post Acute Care Choice:    Choice offered to:     DME Arranged:    DME Agency:     HH Arranged:    HH Agency:     Status of Service:  In process, will continue to follow  Medicare Important Message Given:  No Date Medicare IM Given:    Medicare IM give by:    Date Additional Medicare IM Given:    Additional Medicare Important Message give by:     If discussed at Long Length of Stay Meetings, dates discussed:    Additional Comments: 06/07/14 Raynald BlendSamantha Rondarius Kadrmas, RN, BSN [phone] 11:32; pt is off unit in procedural area, CM will continue to assess for disposition needs      Cherylann ParrClaxton, Rossie Bretado S, RN 06/07/2014, 11:31 AM

## 2014-06-07 NOTE — Interval H&P Note (Signed)
Cath Lab Visit (complete for each Cath Lab visit)  Clinical Evaluation Leading to the Procedure:   ACS: Yes.    Non-ACS:    Anginal Classification: CCS IV  Anti-ischemic medical therapy: Maximal Therapy (2 or more classes of medications)  Non-Invasive Test Results: No non-invasive testing performed  Prior CABG: No previous CABG      History and Physical Interval Note:  06/07/2014 10:46 AM  Kristen Price  has presented today for surgery, with the diagnosis of cp  The various methods of treatment have been discussed with the patient and family. After consideration of risks, benefits and other options for treatment, the patient has consented to  Procedure(s): Left Heart Cath and Coronary Angiography (N/A) as a surgical intervention .  The patient's history has been reviewed, patient examined, no change in status, stable for surgery.  I have reviewed the patient's chart and labs.  Questions were answered to the patient's satisfaction.     KELLY,THOMAS A

## 2014-06-07 NOTE — Progress Notes (Signed)
CRITICAL VALUE ALERT  Critical value received:  Troponin 7.02  Date of notification:  06/07/14  Time of notification:  0357  Critical value read back: Yes  Nurse who received alert:  Roselie AwkwardMegan Shawn Dannenberg, RN  MD notified (1st page):  Dr. Onalee HuaAlvarez  Time of first page:  0400  MD notified (2nd page):  Time of second page:  Responding MD:  Dr. Onalee HuaAlvarez  Time MD responded:  [phone]

## 2014-06-07 NOTE — Progress Notes (Signed)
TR BAND REMOVAL  LOCATION:  right radial  DEFLATED PER PROTOCOL:  Yes.    TIME BAND OFF / DRESSING APPLIED:   1845   SITE UPON ARRIVAL:   Level 0  SITE AFTER BAND REMOVAL:  Level 0  REVERSE ALLEN'S TEST:    positive  CIRCULATION SENSATION AND MOVEMENT:  Within Normal Limits  Yes.    COMMENTS:    

## 2014-06-07 NOTE — Progress Notes (Signed)
  Echocardiogram 2D Echocardiogram has been performed.  Kristen Price [name] 06/07/2014, 9:52 AM

## 2014-06-08 LAB — BASIC METABOLIC PANEL
Anion gap: 11 (ref 5–15)
BUN: <5 mg/dL — ABNORMAL LOW (ref 6–20)
CALCIUM: 8.8 mg/dL — AB (ref 8.9–10.3)
CO2: 22 mmol/L (ref 22–32)
Chloride: 106 mmol/L (ref 101–111)
Creatinine, Ser: 0.58 mg/dL (ref 0.44–1.00)
Glucose, Bld: 94 mg/dL (ref 65–99)
POTASSIUM: 3.7 mmol/L (ref 3.5–5.1)
Sodium: 139 mmol/L (ref 135–145)

## 2014-06-08 LAB — CBC
HCT: 36.8 % (ref 36.0–46.0)
Hemoglobin: 12.5 g/dL (ref 12.0–15.0)
MCH: 31.2 pg (ref 26.0–34.0)
MCHC: 34 g/dL (ref 30.0–36.0)
MCV: 91.8 fL (ref 78.0–100.0)
Platelets: 194 10*3/uL (ref 150–400)
RBC: 4.01 MIL/uL (ref 3.87–5.11)
RDW: 13.3 % (ref 11.5–15.5)
WBC: 9.3 10*3/uL (ref 4.0–10.5)

## 2014-06-08 LAB — HCG, SERUM, QUALITATIVE: Preg, Serum: NEGATIVE

## 2014-06-08 LAB — HEMOGLOBIN A1C
Hgb A1c MFr Bld: 4.4 % — ABNORMAL LOW (ref 4.8–5.6)
MEAN PLASMA GLUCOSE: 80 mg/dL

## 2014-06-08 MED ORDER — NICOTINE 21 MG/24HR TD PT24: 21.0000 mg | MEDICATED_PATCH | Freq: Every day | TRANSDERMAL | Status: DC

## 2014-06-08 MED ORDER — ANGIOPLASTY BOOK
Freq: Once | Status: AC
  Administered 2014-06-08: 06:00:00
  Filled 2014-06-08: qty 1

## 2014-06-08 MED ORDER — METOPROLOL TARTRATE 25 MG PO TABS: 12.5000 mg | ORAL_TABLET | Freq: Two times a day (BID) | ORAL | Status: DC

## 2014-06-08 MED ORDER — LISINOPRIL 2.5 MG PO TABS: 2.5000 mg | ORAL_TABLET | Freq: Every day | ORAL | Status: DC

## 2014-06-08 MED ORDER — LISINOPRIL 5 MG PO TABS
2.5000 mg | ORAL_TABLET | Freq: Every day | ORAL | Status: DC
  Administered 2014-06-08: 11:00:00 2.5 mg via ORAL
  Filled 2014-06-08: qty 1

## 2014-06-08 MED ORDER — ASPIRIN 81 MG PO TBEC: 81.0000 mg | DELAYED_RELEASE_TABLET | Freq: Every day | ORAL | Status: AC

## 2014-06-08 MED ORDER — TICAGRELOR 90 MG PO TABS: 90.0000 mg | ORAL_TABLET | Freq: Two times a day (BID) | ORAL | Status: DC

## 2014-06-08 MED ORDER — ATORVASTATIN CALCIUM 80 MG PO TABS: 80.0000 mg | ORAL_TABLET | Freq: Every day | ORAL | Status: DC

## 2014-06-08 MED ORDER — METOPROLOL TARTRATE 12.5 MG HALF TABLET
12.5000 mg | ORAL_TABLET | Freq: Two times a day (BID) | ORAL | Status: DC
  Administered 2014-06-08: 11:00:00 12.5 mg via ORAL
  Filled 2014-06-08: qty 1

## 2014-06-08 MED ORDER — NITROGLYCERIN 0.4 MG SL SUBL: 0.4000 mg | SUBLINGUAL_TABLET | SUBLINGUAL | Status: DC | PRN

## 2014-06-08 NOTE — Discharge Summary (Signed)
Physician Discharge Summary    Cardiologist:  McAlhany-new  Patient ID: Kristen [name] MRN: [id number] DOB/AGE: [birth year] 40 y.o.  Admit date: 06/06/2014 Discharge date: 06/08/2014  Admission Diagnoses: NSTEMI  Discharge Diagnoses:  Principal Problem:   NSTEMI (non-ST elevated myocardial infarction) Active Problems:   Tobacco abuse   Acute coronary syndrome   Elevated troponin   Discharged Condition: stable  Hospital Course:   The patient is a 39 year old Caucasian female with no prior cardiac history presenting with complaints of chest pain. Her past medical history is significant for tobacco abuse, smoking one pack per day, asthma, and GERD with a hiatal hernia. She also has a strong family history of cardiovascular disease. Both her mother and father suffered myocardial infarctions in their 76s. She denies any history of hypertension, hyperlipidemia or diabetes.  2 weeks ago she developed resting substernal chest discomfort. Initially was mild and only lasted several minutes. She he initially thought it was possibly her acid reflux. However, over the last week her symptoms worsen. She presented to the [facility] emergency department 2 nights ago with similar complaints. She reports cardiac enzymes were checked and she was told that her laboratory work was normal and she was discharged home from the emergency department. She presents back to the ED today with complaints of increasing pain. Substernal chest pain that radiates to her left upper extremity, back, left neck and jaw. Described as a sharp/stabbing pain. Occurs at rest. Has awoken her from her sleep. Not necessarily exacerbated by exertion. No worsening or alleviating factors. 10 out of 10 in maximum intensity. She notes associated dyspnea, nausea, vomiting, dizziness and near-syncope. She took one sublingual nitroglycerin at home without relief. She is now chest pain-free in the ED. Initial troponin is elevated at 0.99. EKG shows  normal sinus rhythm without any ischemic abnormalities. Chest x-ray unremarkable.   She was admitted and started on IV heparin, NTG, statin and ASA.  Beta blocker held initially due to soft BP.  She underwent a left heart cath which revealed 90% proximal Cx lesion which was successfully treated with a DES.  Post cath SCr stable.  Echo revealed an EF of 60%.  Tobacco cessation was advised.  Xanax was given for anxiety.  ASA, lipitor 80, Brilinta, lopresso r 12.5, lisinopril 2.5.  The patient was seen by Dr. Wyline Mood who felt she was stable for DC home.    Consults: None  Significant Diagnostic Studies:  Lipid Panel     Component Value Date/Time   CHOL 180 06/07/2014 0505   TRIG 139 06/07/2014 0505   HDL 24* 06/07/2014 0505   CHOLHDL 7.5 06/07/2014 0505   VLDL 28 06/07/2014 0505   LDLCALC 128* 06/07/2014 0505     Cardiac cath  1st Diag lesion, 20% stenosed.  Prox Cx lesion, 90% stenosed. There is a 0% residual stenosis post intervention.  A drug-eluting stent was placed.  Preserved global LV contractility with an ejection fraction of 55% but with evidence for small focal region of mid to basal inferior hypocontractility.  Predominant single-vessel coronary obstructive disease with evidence for mild 20% narrowing in the very proximal first diagonal branch of the LAD with an otherwise normal LAD system; 90% percent eccentric stenosis in the proximal left circumflex coronary artery arising immediately after the left atrial circumflex branch and a dominant left circumflex system. 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  Successful PCI  of the left circumflex coronary artery with insertion of a 3.518 mm Resolute DES stent postdilated to 3.76 mm with the 90% stenosis being reduced to 0%.  RECOMMENDATION:  The patient will continue with antiplatelets therapy for  minimum of a year. She was screened to participate in the Twilight study involving Brilinta/aspirin. Smoking cessation is imperative. Aggressive lipid-lowering therapy will be instituted along with medical therapy for her CAD.  Echocardiogram Study Conclusions  - Left ventricle: There may be mild decreased thickening at the base of the inferior wall. The cavity size was normal. Wall thickness was normal. The estimated ejection fraction was 60%. - Right ventricle: The cavity size was normal. Systolic function was normal.   Treatments: See above  Discharge Exam: Blood pressure 143/73, pulse 91, temperature 98.7 F (37.1 C), temperature source Oral, resp. rate 14, height 5\' 7"  (1.702 m), weight 210 lb 8.6 oz (95.5 kg), SpO2 99 %.   Disposition: 01-Home or Self Care      Discharge Instructions    Diet - low sodium heart healthy    Complete by:  As directed      Discharge instructions    Complete by:  As directed   No lifting with right arm for two days     Increase activity slowly    Complete by:  As directed             Medication List    TAKE these medications        albuterol 108 (90 BASE) MCG/ACT inhaler  Commonly known as:  PROVENTIL HFA;VENTOLIN HFA  Inhale 2 puffs into the lungs every 6 (six) hours as needed for wheezing or shortness of breath.     alprazolam 2 MG tablet  Commonly known as:  XANAX  Take 0.5-2 mg by mouth 2 (two) times daily as needed for sleep or anxiety.     aspirin 81 MG EC tablet  Take 1 tablet (81 mg total) by mouth daily.     atorvastatin 80 MG tablet  Commonly known as:  LIPITOR  Take 1 tablet (80 mg total) by mouth daily at 6 PM.     escitalopram 20 MG tablet  Commonly known as:  LEXAPRO  Take 20 mg by mouth daily.     HYDROcodone-acetaminophen 10-325 MG per tablet  Commonly known as:  NORCO  Take 1 tablet by mouth every 6 (six) hours as needed for moderate pain.     ipratropium-albuterol 0.5-2.5 (3) MG/3ML Soln    Commonly known as:  DUONEB  Take 1.5 mLs by nebulization 3 (three) times daily as needed (shortness of breath).     lisinopril 2.5 MG tablet  Commonly known as:  PRINIVIL,ZESTRIL  Take 1 tablet (2.5 mg total) by mouth daily.     metoprolol tartrate 25 MG tablet  Commonly known as:  LOPRESSOR  Take 0.5 tablets (12.5 mg total) by mouth 2 (two) times daily.     montelukast 10 MG tablet  Commonly known as:  SINGULAIR  Take 10 mg by mouth at bedtime.     NEXPLANON 68 MG Impl implant  Generic drug:  etonogestrel  Inject 1 each into the skin once.     nicotine 21 mg/24hr patch  Commonly known as:  NICODERM CQ - dosed in mg/24 hours  Place 1 patch (21 mg total) onto the skin daily.     nitroGLYCERIN 0.4 MG SL tablet  Commonly known as:  NITROSTAT  Place 1 tablet (0.4 mg total) under the tongue  every 5 (five) minutes x 3 doses as needed for chest pain.     omeprazole 40 MG capsule  Commonly known as:  PRILOSEC  Take 40 mg by mouth daily.     ticagrelor 90 MG Tabs tablet  Commonly known as:  BRILINTA  Take 1 tablet (90 mg total) by mouth 2 (two) times daily.       Follow-up Information    Follow up with Verne Carrow, MD.   Specialty:  Cardiology   Why:  The office will call you with the follow up appt date and time.     Contact information:   [address] [place] [address] [phone]      Greater than 30 minutes was spent completing the patient's discharge.   SignedWilburt Finlay, PAC 06/08/2014, 8:19 AM

## 2014-06-08 NOTE — Progress Notes (Signed)
Patient ID: Kristen [name], female   DOB: [birth year], 39 y.o.   MRN: [id number]    Primary cardiologist:  Subjective:   Some emotional upset yesterday with some chest pain associated around 10pm, resolved after she calmed down and got her xanax. No other symptoms.    Objective:   Temp:  [98.2 F (36.8 C)-98.9 F (37.2 C)] 98.7 F (37.1 C) (06/04 0448) Pulse Rate:  [0-91] 91 (06/04 0448) Resp:  [0-150] 14 (06/04 0448) BP: (107-177)/(61-89) 143/73 mmHg (06/04 0448) SpO2:  [0 %-100 %] 99 % (06/04 0448) Weight:  [210 lb 8.6 oz (95.5 kg)] 210 lb 8.6 oz (95.5 kg) (06/04 0001) Last BM Date: 06/07/14 (states at baseline)  [employer]   06/06/14 1147 06/08/14 0001  Weight: 194 lb (87.998 kg) 210 lb 8.6 oz (95.5 kg)    Intake/Output Summary (Last 24 hours) at 06/08/14 0657 Last data filed at 06/08/14 0022  Gross per 24 hour  Intake   1300 ml  Output   1150 ml  Net    150 ml    Telemetry: NSR  Exam:  General: NAD   Resp: CTAB  Cardiac: RRR, no m/r/g, no JVD  GI: abdomen soft,NT, ND  MSK: no LE edema  Neuro: no focal deficits   Lab Results:  Basic Metabolic Panel:  Recent Labs Lab 06/06/14 1217 06/07/14 0505 06/08/14 0311  NA 138 138 139  K 4.2 3.6 3.7  CL 107 105 106  CO2 GLUCOSE 115* 123* 94  BUN 9 9 <5*  CREATININE 0.76 0.77 0.58  CALCIUM 8.9 8.6* 8.8*    Liver Function Tests:  Recent Labs Lab 06/06/14 1217  AST 30  ALT 30  ALKPHOS 58  BILITOT 0.4  PROT 6.1*  ALBUMIN 3.5    CBC:  Recent Labs Lab 06/06/14 1217 06/07/14 0505 06/08/14 0311  WBC 12.8* 10.6* 9.3  HGB 13.5 13.0 12.5  HCT 40.1 38.9 36.8  MCV 93.0 92.8 91.8  PLT 222 204 194    Cardiac Enzymes:  Recent Labs Lab 06/06/14 1217 06/06/14 2218 06/07/14 0232  TROPONINI 0.99* 0.99* 7.02*    BNP: No results for input(s): PROBNP in the last 8760 hours.  Coagulation:  Recent Labs Lab 06/07/14 0505  INR 1.05    ECG:   Medications:   Scheduled  Medications: . aspirin EC  81 mg Oral Daily  . atorvastatin  80 mg Oral q1800  . escitalopram  20 mg Oral Daily  . feeding supplement (ENSURE ENLIVE)  237 mL Oral BID BM  . montelukast  10 mg Oral QHS  . nicotine  21 mg Transdermal Daily  . pantoprazole  40 mg Oral Daily  . ticagrelor  90 mg Oral BID     Infusions: . nitroGLYCERIN 5 mcg/min (06/08/14 0500)     PRN Medications:  acetaminophen, ALPRAZolam, ipratropium-albuterol, nitroGLYCERIN, ondansetron (ZOFRAN) IV, zolpidem     Assessment/Plan    39 yo female hx of tobacco abuse and FH of CAD admitted with chest pain and NSTEMI  1. NSTEMI - cath 06/07/14 with D1 20%, prox LCX 90% s/p DES. LVEF 55% - echo LVEF 60% - medical therapy with ASA 81, atorva 80, brillinta. Will start low dose beta blocker and ACE-I.  - post cath Cr and Hgb stable, stable subtle T-wave changes in inferior leads - on low dose NG gtt this AM, turned off at 710 AM, will monitor this AM and if no symptoms plan for discharge. She will need a  work excuse for 2 weeks until her follow up appointment.      Dina RichJonathan Vinayak Bobier, M.D.

## 2014-06-08 NOTE — Progress Notes (Signed)
CARDIAC REHAB PHASE I   PRE:  Rate/Rhythm: Sinus Tach 100  BP:    Sitting: 130/78     SaO2: 97% Room Air  MODE:  Ambulation: 600 ft   POST:  Rate/Rhythem: Sinus Tach 109   BP:  Supine: 121/70       SaO2: 97% Room Air  [phone]  Patient ambulated in the hallway without complaints of chest pain. Kristen [name] did report feeling hot half way through her walk this feeling resolved after resting a minute. Reviewed smoking cessation with patient. Kristen [name] says she has called 1-800-quit now in the past. MI booklet also reviewed. Patient has stent card, brillinta booklet and coupons. Heart healthy diet exercise instructions and when to call 911, use of sublingual nitroglycerin all discussed.   [name], Kristen BruceMaria Walden RN BSN

## 2014-06-10 ENCOUNTER — Encounter (HOSPITAL_COMMUNITY): Payer: Self-pay | Admitting: Cardiovascular Disease

## 2014-06-11 MED FILL — Heparin Sodium (Porcine) 2 Unit/ML in Sodium Chloride 0.9%: INTRAMUSCULAR | Qty: 1500 | Status: AC

## 2014-06-23 NOTE — Progress Notes (Signed)
Cardiology Office Note   Date:  06/24/2014   ID:  Kristen Price, DOB 1975/03/01, MRN [id number]  PCP:  Tomi Bamberger, NP  Cardiologist:  Dr. Verne Carrow     Chief Complaint  Patient presents with  . Coronary Artery Disease  . Hospitalization Follow-up    NSTEMI >> PCI to LCx     History of Present Illness: Kristen Price is a 39 y.o. female with a hx of tobacco abuse, smoking one pack per day, asthma, and GERD with a hiatal hernia and a strong family history of cardiovascular disease. Both her mother and father suffered myocardial infarctions in their 40s.    Admitted 6/2-6/4 with a NSTEMI.  She underwent a left heart cath which revealed 90% proximal Cx lesion which was successfully treated with a DES. Echo revealed an EF of 60%. She had some post PCI chest pain that relieved when Xanax was given for anxiety. ASA, lipitor 80, Brilinta, lopressor, lisinopril started.   She returns for FU.  Since DC, she has been doing well. She has occasional sensations in her chest. She also has some bilateral lower back pain. She denies exertional symptoms. She denies any recurrent angina. She denies significant dyspnea, orthopnea, PND or edema. She denies syncope. She does note increased bruising. She denies melena or hematochezia.   Studies/Reports Reviewed Today:  LHC/PCI 06/07/14 LAD:  D1 20% LCx:  Prox 90% EF:  Inf HK, EF 55-65% PCI:  3.518 mm Resolute DES to prox LCx  Echo 06/07/14 - Left ventricle: There may be mild decreased thickening at the base of the inferior wall. The cavity size was normal. Wall thickness was normal. The estimated ejection fraction was 60%. - Right ventricle: The cavity size was normal. Systolic function was normal.   Past Medical History  Diagnosis Date  . Hiatal hernia   . Hypertension   . NSTEMI (non-ST elevated myocardial infarction) 06/06/2014  . Family history of adverse reaction to anesthesia     "mom didn't want to wake up at all"  . Asthma    . GERD (gastroesophageal reflux disease)   . Chronic lower back pain   . Anxiety   . Depression     Past Surgical History  Procedure Laterality Date  . Fracture surgery    . Femur fracture surgery Left 2003    "metal rod runs from hip to knee"  . Cardiac catheterization N/A 06/07/2014    Procedure: Left Heart Cath and Coronary Angiography;  Surgeon: Lennette Bihari, MD;  Location: [facility] INVASIVE CV LAB;  Service: Cardiovascular;  Laterality: N/A;     Current Outpatient Prescriptions  Medication Sig Dispense Refill  . albuterol (PROVENTIL HFA;VENTOLIN HFA) 108 (90 BASE) MCG/ACT inhaler Inhale 2 puffs into the lungs every 6 (six) hours as needed for wheezing or shortness of breath.    . alprazolam (XANAX) 2 MG tablet Take 0.5-2 mg by mouth 2 (two) times daily as needed for sleep or anxiety.    Marland Kitchen aspirin EC 81 MG EC tablet Take 1 tablet (81 mg total) by mouth daily.    Marland Kitchen atorvastatin (LIPITOR) 80 MG tablet Take 1 tablet (80 mg total) by mouth daily at 6 PM. 30 tablet 11  . escitalopram (LEXAPRO) 20 MG tablet Take 20 mg by mouth daily.    Marland Kitchen etonogestrel (NEXPLANON) 68 MG IMPL implant Inject 1 each into the skin once.    Marland Kitchen HYDROcodone-acetaminophen (NORCO) 10-325 MG per tablet Take 1 tablet by mouth every 6 (six) hours as needed  for moderate pain.    Marland Kitchen ipratropium-albuterol (DUONEB) 0.5-2.5 (3) MG/3ML SOLN Take 1.5 mLs by nebulization 3 (three) times daily as needed (shortness of breath).     Marland Kitchen lisinopril (PRINIVIL,ZESTRIL) 2.5 MG tablet Take 1 tablet (2.5 mg total) by mouth daily. 30 tablet 11  . methocarbamol (ROBAXIN) 750 MG tablet Take 750 mg by mouth every 6 (six) hours as needed.     . metoprolol tartrate (LOPRESSOR) 25 MG tablet Take 0.5 tablets (12.5 mg total) by mouth 2 (two) times daily. 60 tablet 5  . montelukast (SINGULAIR) 10 MG tablet Take 10 mg by mouth at bedtime.    . nicotine (NICODERM CQ - DOSED IN MG/24 HOURS) 21 mg/24hr patch Place 1 patch (21 mg total) onto the skin daily.  28 patch 0  . nitroGLYCERIN (NITROSTAT) 0.4 MG SL tablet Place 1 tablet (0.4 mg total) under the tongue every 5 (five) minutes x 3 doses as needed for chest pain. 25 tablet 12  . omeprazole (PRILOSEC) 40 MG capsule Take 40 mg by mouth 2 (two) times daily.     . ticagrelor (BRILINTA) 90 MG TABS tablet Take 1 tablet (90 mg total) by mouth 2 (two) times daily. 60 tablet 11   No current facility-administered medications for this visit.    Allergies:   Augmentin; Benadryl; and Penicillins    Social History:  The patient  reports that she has quit smoking. Her smoking use included Cigarettes. She has a 12.5 pack-year smoking history. She has never used smokeless tobacco. She reports that she drinks alcohol. She reports that she does not use illicit drugs.   Family History:  The patient's family history includes Coronary artery disease (age of onset: 54) in her father; Coronary artery disease (age of onset: 7) in her mother; Diabetes in her mother; Heart attack in her father and mother; Hypertension in her father and mother. There is no history of Stroke.    ROS:   Please see the history of present illness.   Review of Systems  Constitution: Positive for chills.  Cardiovascular: Positive for chest pain.  Hematologic/Lymphatic: Positive for bleeding problem. Bruises/bleeds easily.  Musculoskeletal: Positive for back pain and myalgias.  Gastrointestinal: Positive for diarrhea.  Psychiatric/Behavioral: The patient is nervous/anxious.   All other systems reviewed and are negative.     PHYSICAL EXAM: VS:  BP 110/55 mmHg  Pulse 65  Ht  (1.702 m)  Wt 195 lb (88.451 kg)  BMI 30.53 kg/m2    Wt Readings from Last 3 Encounters:  06/24/14 195 lb (88.451 kg)  06/08/14 210 lb 8.6 oz (95.5 kg)  06/02/14 194 lb (87.998 kg)     GEN: Well nourished, well developed, in no acute distress HEENT: normal Neck: no JVD,  no masses Cardiac:  Normal S1/S2, RRR; no murmur, no rubs or gallops, no  edema; right wrist without hematoma or mass  Respiratory:  Decreased breath sounds bilaterally, no wheezing, rhonchi or rales. GI: soft, nontender, nondistended, + BS MS: no deformity or atrophy Skin: warm and dry  Neuro:  CNs II-XII intact, Strength and sensation are intact Psych: Normal affect   EKG:  EKG is ordered today.  It demonstrates:   NSR, HR 65, normal axis   Recent Labs: 06/02/2014: B Natriuretic Peptide 34.1 06/06/2014: ALT 30 06/08/2014: Hemoglobin 12.5; Platelets 194 06/24/2014: BUN 6; Creatinine, Ser 0.58; Potassium 3.5; Sodium 137    Lipid Panel    Component Value Date/Time   CHOL 180 06/07/2014 0505  TRIG 139 06/07/2014 0505   HDL 24* 06/07/2014 0505   CHOLHDL 7.5 06/07/2014 0505   VLDL 28 06/07/2014 0505   LDLCALC 128* 06/07/2014 0505      ASSESSMENT AND PLAN:  Coronary artery disease involving native coronary artery of native heart without angina pectoris:  Overall, she has been doing well since discharge from the hospital. She has had occasional atypical chest pains as well as bilateral lower back pain. However, she denies any recurrent angina. ECG is normal. She had single-vessel disease or cardiac catheterization. I suspect her back pain is musculoskeletal. Reassurance provided today. We discussed the importance of dual antiplatelet therapy. Continue aspirin, Brilinta, ACE inhibitor, beta blocker, statin. We discussed the importance of preventing pregnancy while on ACE inhibitor. She is on birth control. Check follow-up BMET. I will refer her to cardiac rehabilitation. I have given her a note to return to work next week.  Hyperlipidemia:  Continue high-dose statin. Check lipids and LFTs in 6-8 weeks.  Tobacco abuse:  She is trying to quit smoking. She is currently using a Vape.      Medication Changes: Current medicines are reviewed at length with the patient today.  Concerns regarding medicines are as outlined above.  The following changes have been made:    Discontinued Medications   No medications on file   Modified Medications   No medications on file   New Prescriptions   No medications on file    Labs/ tests ordered today include:   Orders Placed This Encounter  Procedures  . Basic metabolic panel  . Lipid Profile  . Hepatic function panel  . AMB referral to cardiac rehabilitation  . EKG 12-Lead     Disposition:   FU with Dr. Verne Carrow 8 weeks.    Signed, Brynda Rim, MHS 06/24/2014 4:57 PM    [facility] [address] [place], [place], [place]  [address] Phone: [phone]; Fax: [phone]

## 2014-06-24 ENCOUNTER — Encounter: Payer: Self-pay | Admitting: Physician Assistant

## 2014-06-24 ENCOUNTER — Ambulatory Visit (INDEPENDENT_AMBULATORY_CARE_PROVIDER_SITE_OTHER): Payer: BLUE CROSS/BLUE SHIELD | Admitting: Physician Assistant

## 2014-06-24 VITALS — BP 110/55 | HR 65 | Ht 67.0 in | Wt 195.0 lb

## 2014-06-24 DIAGNOSIS — E785 Hyperlipidemia, unspecified: Secondary | ICD-10-CM

## 2014-06-24 DIAGNOSIS — Z720 Tobacco use: Secondary | ICD-10-CM

## 2014-06-24 DIAGNOSIS — I2510 Atherosclerotic heart disease of native coronary artery without angina pectoris: Secondary | ICD-10-CM

## 2014-06-24 LAB — BASIC METABOLIC PANEL
BUN: 6 mg/dL (ref 6–23)
CO2: 29 meq/L (ref 19–32)
Calcium: 8.9 mg/dL (ref 8.4–10.5)
Chloride: 103 mEq/L (ref 96–112)
Creatinine, Ser: 0.58 mg/dL (ref 0.40–1.20)
GFR: 122.6 mL/min (ref >60.00)
Glucose, Bld: 87 mg/dL (ref 70–99)
POTASSIUM: 3.5 meq/L (ref 3.5–5.1)
SODIUM: 137 meq/L (ref 135–145)

## 2014-06-24 NOTE — Patient Instructions (Signed)
Medication Instructions: - no changes  Labwork: - Your physician recommends that you have lab work today: BMP  - Your physician recommends that you return for a FASTING lipid profile/ liver: 6-8 weeks  Procedures/Testing: - none  Follow-Up: - Your physician recommends that you schedule a follow-up appointment in: 8 weeks with Dr. Clifton James  Any Additional Special Instructions Will Be Listed Below (If Applicable). - We will refer you to Cardiac Rehab- they will call you to start this once the order is received.

## 2014-06-25 ENCOUNTER — Telehealth: Payer: Self-pay | Admitting: *Deleted

## 2014-06-25 NOTE — Telephone Encounter (Signed)
Pt notified of normal lab results with verbal understanding  

## 2014-06-26 ENCOUNTER — Telehealth: Payer: Self-pay | Admitting: Physician Assistant

## 2014-06-26 NOTE — Telephone Encounter (Signed)
Received signed release form and check #[id number] in the amount $25.00 sending to [place].  06/26/14 ltd

## 2014-07-03 ENCOUNTER — Other Ambulatory Visit: Payer: Self-pay | Admitting: *Deleted

## 2014-07-03 ENCOUNTER — Telehealth: Payer: Self-pay | Admitting: *Deleted

## 2014-07-03 MED ORDER — LISINOPRIL 2.5 MG PO TABS: 2.5000 mg | ORAL_TABLET | Freq: Every day | ORAL | Status: DC

## 2014-07-03 MED ORDER — TICAGRELOR 90 MG PO TABS: 90.0000 mg | ORAL_TABLET | Freq: Two times a day (BID) | ORAL | Status: DC

## 2014-07-03 NOTE — Telephone Encounter (Signed)
Spoke with pt and appt made for her to see Dr. Clifton JamesMcAlhany on August 22, 2014 at 9:15.  Pt aware fasting labs to be done same day

## 2014-07-03 NOTE — Telephone Encounter (Signed)
Patient stated that you were going to try to work her in for a same day appointment to see Dr Clifton JamesMcAlhany on the day that she has labs. Just wanted to check with you on this as she was requesting 90 day refills. Thanks, MI

## 2014-07-05 ENCOUNTER — Other Ambulatory Visit: Payer: Self-pay | Admitting: Cardiology

## 2014-08-01 ENCOUNTER — Other Ambulatory Visit: Payer: Self-pay | Admitting: Cardiovascular Disease

## 2014-08-19 ENCOUNTER — Other Ambulatory Visit: Payer: BLUE CROSS/BLUE SHIELD

## 2014-08-22 ENCOUNTER — Ambulatory Visit: Payer: BLUE CROSS/BLUE SHIELD | Admitting: Cardiovascular Disease

## 2014-08-22 ENCOUNTER — Other Ambulatory Visit: Payer: BLUE CROSS/BLUE SHIELD

## 2014-09-04 ENCOUNTER — Other Ambulatory Visit: Payer: Self-pay | Admitting: *Deleted

## 2014-09-04 MED ORDER — ATORVASTATIN CALCIUM 80 MG PO TABS: 80.0000 mg | ORAL_TABLET | Freq: Every day | ORAL | Status: DC

## 2014-09-06 ENCOUNTER — Other Ambulatory Visit: Payer: Self-pay | Admitting: Cardiovascular Disease

## 2014-09-16 ENCOUNTER — Other Ambulatory Visit (INDEPENDENT_AMBULATORY_CARE_PROVIDER_SITE_OTHER): Payer: BLUE CROSS/BLUE SHIELD | Admitting: *Deleted

## 2014-09-16 ENCOUNTER — Encounter: Payer: Self-pay | Admitting: Physician Assistant

## 2014-09-16 ENCOUNTER — Ambulatory Visit (INDEPENDENT_AMBULATORY_CARE_PROVIDER_SITE_OTHER): Payer: BLUE CROSS/BLUE SHIELD | Admitting: Physician Assistant

## 2014-09-16 VITALS — BP 120/72 | HR 79 | Ht 67.0 in | Wt 197.8 lb

## 2014-09-16 DIAGNOSIS — I2510 Atherosclerotic heart disease of native coronary artery without angina pectoris: Secondary | ICD-10-CM

## 2014-09-16 DIAGNOSIS — Z720 Tobacco use: Secondary | ICD-10-CM

## 2014-09-16 DIAGNOSIS — I252 Old myocardial infarction: Secondary | ICD-10-CM

## 2014-09-16 DIAGNOSIS — E785 Hyperlipidemia, unspecified: Secondary | ICD-10-CM

## 2014-09-16 LAB — LIPID PANEL
CHOL/HDL RATIO: 4
CHOLESTEROL: 129 mg/dL (ref 0–200)
HDL: 30.8 mg/dL — ABNORMAL LOW (ref >39.00)
LDL CALC: 77 mg/dL (ref 0–99)
NONHDL: 98.43
Triglycerides: 108 mg/dL (ref 0.0–149.0)
VLDL: 21.6 mg/dL (ref 0.0–40.0)

## 2014-09-16 LAB — HEPATIC FUNCTION PANEL
ALT: 13 U/L (ref 0–35)
AST: 16 U/L (ref 0–37)
Albumin: 3.9 g/dL (ref 3.5–5.2)
Alkaline Phosphatase: 65 U/L (ref 39–117)
BILIRUBIN DIRECT: 0.1 mg/dL (ref 0.0–0.3)
BILIRUBIN TOTAL: 0.4 mg/dL (ref 0.2–1.2)
Total Protein: 6.8 g/dL (ref 6.0–8.3)

## 2014-09-16 MED ORDER — TICAGRELOR 90 MG PO TABS: 90.0000 mg | ORAL_TABLET | Freq: Two times a day (BID) | ORAL | Status: DC

## 2014-09-16 MED ORDER — LISINOPRIL 2.5 MG PO TABS: 2.5000 mg | ORAL_TABLET | Freq: Every day | ORAL | Status: DC

## 2014-09-16 MED ORDER — ATORVASTATIN CALCIUM 80 MG PO TABS: 80.0000 mg | ORAL_TABLET | Freq: Every day | ORAL | Status: DC

## 2014-09-16 MED ORDER — NITROGLYCERIN 0.4 MG SL SUBL: 0.4000 mg | SUBLINGUAL_TABLET | SUBLINGUAL | Status: DC | PRN

## 2014-09-16 MED ORDER — METOPROLOL TARTRATE 25 MG PO TABS: 12.5000 mg | ORAL_TABLET | Freq: Two times a day (BID) | ORAL | Status: DC

## 2014-09-16 NOTE — Progress Notes (Signed)
Cardiology Office Note   Date:  09/16/2014   ID:  Kristen Price, DOB 01/17/1975, MRN [id number]  PCP:  Kristen Bamberger, NP  Cardiologist:  Dr. Clifton James Chief Complaint:    History of Present Illness: Kristen Price is a 39 y.o. female who presents for two-month follow-up.She has a history of a NSTEMI 06/06/14 treated with drug-eluting stent to the proximal circumflex. Echo revealed an EF of 60%. She was treated with aspirin, Lipitor, Brilinta, Lopressor and lisinopril. She saw Tereso Newcomer in follow-up 06/24/14 and was trying to quit smoking.   Patient comes in today for follow-up. She is frustrated because she could not get refills on her medications. She has been out of Brilinta for 2 days. She denies any chest pain, palpitations, dyspnea, dizziness or presyncope. 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 She still smokes about 1 cigarette a day. She says every time she tries to quit her blood pressure goes high. She complains of excessive sweating whenever she does anything. She thinks it's coming from a medication.   Past Medical History  Diagnosis Date  . Hiatal hernia   . Hypertension   . NSTEMI (non-ST elevated myocardial infarction) 06/06/2014  . Family history of adverse reaction to anesthesia     "mom didn't want to wake up at all"  . Asthma   . GERD (gastroesophageal reflux disease)   . Chronic lower back pain   . Anxiety   . Depression     Past Surgical History  Procedure Laterality Date  . Fracture surgery    . Femur fracture surgery Left 2003    "metal rod runs from hip to knee"  . Cardiac catheterization N/A 06/07/2014    Procedure: Left Heart Cath and Coronary Angiography;  Surgeon: Lennette Bihari, MD;  Location: [facility];  Service: Cardiovascular;  Laterality: N/A;     Current Outpatient Prescriptions  Medication Sig Dispense Refill  . albuterol (PROVENTIL HFA;VENTOLIN HFA) 108 (90 BASE) MCG/ACT  inhaler Inhale 2 puffs into the lungs every 6 (six) hours as needed for wheezing or shortness of breath.    . alprazolam (XANAX) 2 MG tablet Take 0.5-2 mg by mouth 2 (two) times daily as needed for sleep or anxiety.    Marland Kitchen aspirin EC 81 MG EC tablet Take 1 tablet (81 mg total) by mouth daily.    Marland Kitchen atorvastatin (LIPITOR) 80 MG tablet Take 1 tablet (80 mg total) by mouth daily at 6 PM. 90 tablet 2  . BRILINTA 90 MG TABS tablet TAKE 1 TABLET BY MOUTH TWICE A DAY 60 tablet 1  . escitalopram (LEXAPRO) 20 MG tablet Take 20 mg by mouth daily.    Marland Kitchen etonogestrel (NEXPLANON) 68 MG IMPL implant Inject 1 each into the skin once.    Marland Kitchen HYDROcodone-acetaminophen (NORCO) 10-325 MG per tablet Take 1 tablet by mouth every 6 (six) hours as needed for moderate pain.    Marland Kitchen ipratropium-albuterol (DUONEB) 0.5-2.5 (3) MG/3ML SOLN Take 1.5 mLs by nebulization 3 (three) times daily as needed (shortness of breath).     Marland Kitchen lisinopril (PRINIVIL,ZESTRIL) 2.5 MG tablet Take 1 tablet (2.5 mg total) by mouth daily. 90 tablet 0  . methocarbamol (ROBAXIN) 750 MG tablet Take 750 mg by mouth every 6 (six) hours as needed.     . metoprolol tartrate (LOPRESSOR) 25 MG tablet Take 0.5 tablets (12.5 mg total) by mouth 2 (two) times daily. 60  tablet 5  . montelukast (SINGULAIR) 10 MG tablet Take 10 mg by mouth at bedtime.    . nicotine (NICODERM CQ - DOSED IN MG/24 HOURS) 21 mg/24hr patch Place 1 patch (21 mg total) onto the skin daily. 28 patch 0  . nitroGLYCERIN (NITROSTAT) 0.4 MG SL tablet Place 1 tablet (0.4 mg total) under the tongue every 5 (five) minutes x 3 doses as needed for chest pain. 25 tablet 12  . omeprazole (PRILOSEC) 40 MG capsule Take 40 mg by mouth 2 (two) times daily.      No current facility-administered medications for this visit.    Allergies:   Augmentin; Benadryl; and Penicillins    Social History:  The patient  reports that she has quit smoking. Her smoking use included Cigarettes. She has a 12.5 pack-year smoking  history. She has never used smokeless tobacco. She reports that she drinks alcohol. She reports that she does not use illicit drugs.   Family History:  The patient's    family history includes Coronary artery disease (age of onset: 21) in her father; Coronary artery disease (age of onset: 62) in her mother; Diabetes in her mother; Heart attack in her father and mother; Hypertension in her father and mother. There is no history of Stroke.    ROS:  Please see the history of present illness.   Otherwise, review of systems are positive for cough, dyspnea on exertion, wheezing, anxiety, chronic back pain, easy bleeding.   All other systems are reviewed and negative.    PHYSICAL EXAM: VS:  BP 120/72 mmHg  Pulse 79  Ht 5\' 7"  (1.702 m)  Wt 197 lb 12 oz (89.699 kg)  BMI 30.96 kg/m2 , BMI Body mass index is 30.96 kg/(m^2). GEN: Well nourished, well developed, in no acute distress Neck: no JVD, HJR, carotid bruits, or masses Cardiac: RRR; no murmurs,gallop, rubs, thrill or heave,  Respiratory:  clear to auscultation bilaterally, normal work of breathing GI: soft, nontender, nondistended, + BS MS: no deformity or atrophy Extremities: without cyanosis, clubbing, edema, good distal pulses bilaterally.  Skin: warm and dry, no rash Neuro:  Strength and sensation are intact    EKG:  EKG is not ordered today.    Recent Labs: 06/02/2014: B Natriuretic Peptide 34.1 06/06/2014: ALT 30 06/08/2014: Hemoglobin 12.5; Platelets 194 06/24/2014: BUN 6; Creatinine, Ser 0.58; Potassium 3.5; Sodium 137    Lipid Panel    Component Value Date/Time   CHOL 180 06/07/2014 0505   TRIG 139 06/07/2014 0505   HDL 24* 06/07/2014 0505   CHOLHDL 7.5 06/07/2014 0505   VLDL 28 06/07/2014 0505   LDLCALC 128* 06/07/2014 0505      Wt Readings from Last 3 Encounters:  09/16/14 197 lb 12 oz (89.699 kg)  06/24/14 195 lb (88.451 kg)  06/08/14 210 lb 8.6 oz (95.5 kg)      Other studies Reviewed: Additional studies/  records that were reviewed today include and review of the records demonstrates: LHC/PCI 06/07/14 LAD:  D1 20% LCx:  Prox 90% EF:  Inf HK, EF 55-65% PCI:  3.518 mm Resolute DES to prox LCx  Echo 06/07/14 - Left ventricle: There may be mild decreased thickening at the base of the inferior wall. The cavity size was normal. Wall thickness was normal. The estimated ejection fraction was 60%. - Right ventricle: The cavity size was normal. Systolic function  was normal.    ASSESSMENT AND PLAN:  Coronary artery disease involving native coronary artery of native heart without  angina pectoris Patient is doing well after her MI. She denies any chest pain. She ran out of her Brilinta 2 days ago. We will refill this. Recommend smoking cessation. She had labs drawn today for lipids. Follow-up with Dr.McAlhany in 2-3 months.  Tobacco abuse Smoking cessation discussed.    Elson Clan, PA-C  09/16/2014 12:41 PM    [facility] [address] [place], [place], [place]  [address] Phone: [phone]; Fax: [phone]

## 2014-09-16 NOTE — Assessment & Plan Note (Signed)
Patient is doing well after her MI. She denies any chest pain. She ran out of her Brilinta 2 days ago. We will refill this. Recommend smoking cessation. She had labs drawn today for lipids. Follow-up with Dr.McAlhany in 2-3 months.

## 2014-09-16 NOTE — Assessment & Plan Note (Signed)
Smoking cessation discussed 

## 2014-09-16 NOTE — Patient Instructions (Addendum)
Medication Instructions:  Your physician recommends that you continue on your current medications as directed. Please refer to the Current Medication list given to you today.   Labwork: None ordered  Testing/Procedures: None ordered  Follow-Up: Your physician recommends that you schedule a follow-up appointment in: 2-3 MONTHS WITH DR. Clifton James   Any Other Special Instructions Will Be Listed Below (If Applicable).  Smoking Cessation Quitting smoking is important to your health and has many advantages. However, it is not always easy to quit since nicotine is a very addictive drug. Oftentimes, people try 3 times or more before being able to quit. This document explains the best ways for you to prepare to quit smoking. Quitting takes hard work and a lot of effort, but you can do it. ADVANTAGES OF QUITTING SMOKING  You will live longer, feel better, and live better.  Your body will feel the impact of quitting smoking almost immediately.  Within 20 minutes, blood pressure decreases. Your pulse returns to its normal level.  After 8 hours, carbon monoxide levels in the blood return to normal. Your oxygen level increases.  After 24 hours, the chance of having a heart attack starts to decrease. Your breath, hair, and body stop smelling like smoke.  After 48 hours, damaged nerve endings begin to recover. Your sense of taste and smell improve.  After 72 hours, the body is virtually free of nicotine. Your bronchial tubes relax and breathing becomes easier.  After 2 to 12 weeks, lungs can hold more air. Exercise becomes easier and circulation improves.  The risk of having a heart attack, stroke, cancer, or lung disease is greatly reduced.  After 1 year, the risk of coronary heart disease is cut in half.  After 5 years, the risk of stroke falls to the same as a nonsmoker.  After 10 years, the risk of lung cancer is cut in half and the risk of other cancers decreases significantly.  After  15 years, the risk of coronary heart disease drops, usually to the level of a nonsmoker.  If you are pregnant, quitting smoking will improve your chances of having a healthy baby.  The people you live with, especially any children, will be healthier.  You will have extra money to spend on things other than cigarettes. QUESTIONS TO THINK ABOUT BEFORE ATTEMPTING TO QUIT You may want to talk about your answers with your health care provider.  Why do you want to quit?  If you tried to quit in the past, what helped and what did not?  What will be the most difficult situations for you after you quit? How will you plan to handle them?  Who can help you through the tough times? Your family? Friends? A health care provider?  What pleasures do you get from smoking? What ways can you still get pleasure if you quit? Here are some questions to ask your health care provider:  How can you help me to be successful at quitting?  What medicine do you think would be best for me and how should I take it?  What should I do if I need more help?  What is smoking withdrawal like? How can I get information on withdrawal? GET READY  Set a quit date.  Change your environment by getting rid of all cigarettes, ashtrays, matches, and lighters in your home, car, or work. Do not let people smoke in your home.  Review your past attempts to quit. Think about what worked and what did not. GET  SUPPORT AND ENCOURAGEMENT You have a better chance of being successful if you have help. You can get support in many ways.  Tell your family, friends, and coworkers that you are going to quit and need their support. Ask them not to smoke around you.  Get individual, group, or telephone counseling and support. Programs are available at [employer] and health centers. Call your local health department for information about programs in your area.  Spiritual beliefs and practices may help some smokers quit.  Download a  "quit meter" on your computer to keep track of quit statistics, such as how long you have gone without smoking, cigarettes not smoked, and money saved.  Get a self-help book about quitting smoking and staying off tobacco. LEARN NEW SKILLS AND BEHAVIORS  Distract yourself from urges to smoke. Talk to someone, go for a walk, or occupy your time with a task.  Change your normal routine. Take a different route to work. Drink tea instead of coffee. Eat breakfast in a different place.  Reduce your stress. Take a hot bath, exercise, or read a book.  Plan something enjoyable to do every day. Reward yourself for not smoking.  Explore interactive web-based programs that specialize in helping you quit. GET MEDICINE AND USE IT CORRECTLY Medicines can help you stop smoking and decrease the urge to smoke. Combining medicine with the above behavioral methods and support can greatly increase your chances of successfully quitting smoking.  Nicotine replacement therapy helps deliver nicotine to your body without the negative effects and risks of smoking. Nicotine replacement therapy includes nicotine gum, lozenges, inhalers, nasal sprays, and skin patches. Some may be available over-the-counter and others require a prescription.  Antidepressant medicine helps people abstain from smoking, but how this works is unknown. This medicine is available by prescription.  Nicotinic receptor partial agonist medicine simulates the effect of nicotine in your brain. This medicine is available by prescription. Ask your health care provider for advice about which medicines to use and how to use them based on your health history. Your health care provider will tell you what side effects to look out for if you choose to be on a medicine or therapy. Carefully read the information on the package. Do not use any other product containing nicotine while using a nicotine replacement product.  RELAPSE OR DIFFICULT SITUATIONS Most  relapses occur within the first 3 months after quitting. Do not be discouraged if you start smoking again. Remember, most people try several times before finally quitting. You may have symptoms of withdrawal because your body is used to nicotine. You may crave cigarettes, be irritable, feel very hungry, cough often, get headaches, or have difficulty concentrating. The withdrawal symptoms are only temporary. They are strongest when you first quit, but they will go away within 10-14 days. To reduce the chances of relapse, try to:  Avoid drinking alcohol. Drinking lowers your chances of successfully quitting.  Reduce the amount of caffeine you consume. Once you quit smoking, the amount of caffeine in your body increases and can give you symptoms, such as a rapid heartbeat, sweating, and anxiety.  Avoid smokers because they can make you want to smoke.  Do not let weight gain distract you. Many smokers will gain weight when they quit, usually less than 10 pounds. Eat a healthy diet and stay active. You can always lose the weight gained after you quit.  Find ways to improve your mood other than smoking. FOR MORE INFORMATION  www.smokefree.gov  Document  Released: 12/15/2000 Document Revised: 05/07/2013 Document Reviewed: 04/01/2011 [facility] Patient Information 2015 [place], [place]. This information is not intended to replace advice given to you by your health care provider. Make sure you discuss any questions you have with your health care provider.

## 2014-09-16 NOTE — Addendum Note (Signed)
Addended by: Tonita Phoenix on: 09/16/2014 12:08 PM   Modules accepted: Orders

## 2014-09-19 ENCOUNTER — Telehealth: Payer: Self-pay | Admitting: *Deleted

## 2014-09-19 NOTE — Telephone Encounter (Signed)
Pt notified of lab results by phone with verbal understanding.  

## 2015-01-08 ENCOUNTER — Encounter: Payer: BLUE CROSS/BLUE SHIELD | Admitting: Cardiovascular Disease

## 2015-01-08 NOTE — Progress Notes (Signed)
No show

## 2015-01-15 ENCOUNTER — Encounter: Payer: Self-pay | Admitting: Cardiovascular Disease

## 2015-02-23 ENCOUNTER — Emergency Department (HOSPITAL_COMMUNITY): Payer: BLUE CROSS/BLUE SHIELD

## 2015-02-23 ENCOUNTER — Emergency Department (HOSPITAL_COMMUNITY)
Admission: EM | Admit: 2015-02-23 | Discharge: 2015-02-23 | Disposition: A | Discharge status: Home / Self Care | Payer: BLUE CROSS/BLUE SHIELD | Attending: Emergency Medicine | Admitting: Emergency Medicine

## 2015-02-23 ENCOUNTER — Encounter (HOSPITAL_COMMUNITY): Payer: Self-pay | Admitting: *Deleted

## 2015-02-23 DIAGNOSIS — Z880 Allergy status to penicillin: Secondary | ICD-10-CM | POA: Diagnosis not present

## 2015-02-23 DIAGNOSIS — Z79899 Other long term (current) drug therapy: Secondary | ICD-10-CM | POA: Insufficient documentation

## 2015-02-23 DIAGNOSIS — Z87891 Personal history of nicotine dependence: Secondary | ICD-10-CM | POA: Insufficient documentation

## 2015-02-23 DIAGNOSIS — Z9889 Other specified postprocedural states: Secondary | ICD-10-CM | POA: Insufficient documentation

## 2015-02-23 DIAGNOSIS — I252 Old myocardial infarction: Secondary | ICD-10-CM | POA: Diagnosis not present

## 2015-02-23 DIAGNOSIS — I10 Essential (primary) hypertension: Secondary | ICD-10-CM | POA: Insufficient documentation

## 2015-02-23 DIAGNOSIS — G8929 Other chronic pain: Secondary | ICD-10-CM | POA: Diagnosis not present

## 2015-02-23 DIAGNOSIS — K219 Gastro-esophageal reflux disease without esophagitis: Secondary | ICD-10-CM | POA: Diagnosis not present

## 2015-02-23 DIAGNOSIS — K829 Disease of gallbladder, unspecified: Secondary | ICD-10-CM

## 2015-02-23 DIAGNOSIS — R1010 Upper abdominal pain, unspecified: Secondary | ICD-10-CM | POA: Diagnosis present

## 2015-02-23 DIAGNOSIS — F419 Anxiety disorder, unspecified: Secondary | ICD-10-CM | POA: Diagnosis not present

## 2015-02-23 DIAGNOSIS — Z7982 Long term (current) use of aspirin: Secondary | ICD-10-CM | POA: Insufficient documentation

## 2015-02-23 DIAGNOSIS — F329 Major depressive disorder, single episode, unspecified: Secondary | ICD-10-CM | POA: Insufficient documentation

## 2015-02-23 DIAGNOSIS — J45909 Unspecified asthma, uncomplicated: Secondary | ICD-10-CM | POA: Diagnosis not present

## 2015-02-23 LAB — CBC
HEMATOCRIT: 39.6 % (ref 36.0–46.0)
Hemoglobin: 13.3 g/dL (ref 12.0–15.0)
MCH: 31.4 pg (ref 26.0–34.0)
MCHC: 33.6 g/dL (ref 30.0–36.0)
MCV: 93.4 fL (ref 78.0–100.0)
PLATELETS: 235 10*3/uL (ref 150–400)
RBC: 4.24 MIL/uL (ref 3.87–5.11)
RDW: 12.8 % (ref 11.5–15.5)
WBC: 6.6 10*3/uL (ref 4.0–10.5)

## 2015-02-23 LAB — URINALYSIS, ROUTINE W REFLEX MICROSCOPIC
Bilirubin Urine: NEGATIVE
GLUCOSE, UA: NEGATIVE mg/dL
Hgb urine dipstick: NEGATIVE
KETONES UR: NEGATIVE mg/dL
LEUKOCYTES UA: NEGATIVE
Nitrite: NEGATIVE
PH: 7 (ref 5.0–8.0)
Protein, ur: NEGATIVE mg/dL
Specific Gravity, Urine: 1.006 (ref 1.005–1.030)

## 2015-02-23 LAB — BASIC METABOLIC PANEL
ANION GAP: 13 (ref 5–15)
BUN: <5 mg/dL — ABNORMAL LOW (ref 6–20)
CALCIUM: 9.4 mg/dL (ref 8.9–10.3)
CO2: 22 mmol/L (ref 22–32)
CREATININE: 0.67 mg/dL (ref 0.44–1.00)
Chloride: 105 mmol/L (ref 101–111)
Glucose, Bld: 143 mg/dL — ABNORMAL HIGH (ref 65–99)
Potassium: 3.9 mmol/L (ref 3.5–5.1)
SODIUM: 140 mmol/L (ref 135–145)

## 2015-02-23 LAB — LIPASE, BLOOD: LIPASE: 23 U/L (ref 11–51)

## 2015-02-23 LAB — RAPID URINE DRUG SCREEN, HOSP PERFORMED
AMPHETAMINES: NOT DETECTED
BARBITURATES: NOT DETECTED
BENZODIAZEPINES: POSITIVE — AB
Cocaine: NOT DETECTED
Opiates: POSITIVE — AB
TETRAHYDROCANNABINOL: NOT DETECTED

## 2015-02-23 LAB — HEPATIC FUNCTION PANEL
ALBUMIN: 3.8 g/dL (ref 3.5–5.0)
ALT: 21 U/L (ref 14–54)
AST: 25 U/L (ref 15–41)
Alkaline Phosphatase: 70 U/L (ref 38–126)
BILIRUBIN DIRECT: 0.1 mg/dL (ref 0.1–0.5)
BILIRUBIN TOTAL: 0.4 mg/dL (ref 0.3–1.2)
Indirect Bilirubin: 0.3 mg/dL (ref 0.3–0.9)
Total Protein: 7 g/dL (ref 6.5–8.1)

## 2015-02-23 LAB — I-STAT TROPONIN, ED: Troponin i, poc: 0 ng/mL (ref 0.00–0.08)

## 2015-02-23 MED ORDER — ONDANSETRON HCL 4 MG/2ML IJ SOLN
4.0000 mg | Freq: Once | INTRAMUSCULAR | Status: AC
  Administered 2015-02-23: 4 mg via INTRAVENOUS
  Filled 2015-02-23: qty 2

## 2015-02-23 MED ORDER — OXYCODONE-ACETAMINOPHEN 5-325 MG PO TABS: 1.0000 | ORAL_TABLET | ORAL | Status: DC | PRN

## 2015-02-23 MED ORDER — HYDROMORPHONE HCL 1 MG/ML IJ SOLN
1.0000 mg | Freq: Once | INTRAMUSCULAR | Status: AC
  Administered 2015-02-23: 1 mg via INTRAVENOUS
  Filled 2015-02-23: qty 1

## 2015-02-23 NOTE — ED Notes (Signed)
PT in [place] at this time  

## 2015-02-23 NOTE — ED Notes (Signed)
Pt states that she has had chest pain for 2 weeks. Pt states that the pain has now become constant. Pt states that she was treated for a hiatal hernia in the past for this pain with some relief until recently. Pt states that she has had n/v as well.

## 2015-02-23 NOTE — ED Provider Notes (Signed)
CSN: [id number]     Arrival date & time 02/23/15  1044 History   First MD Initiated Contact with Patient 02/23/15 1118     Chief Complaint  Patient presents with  . Chest Pain     (Consider location/radiation/quality/duration/timing/severity/associated sxs/prior Treatment) HPI   Kristen Price is a 40 y.o. female presents for evaluation of upper abdominal pain present for 2 weeks, worsening in becoming constant over the last week, and associated with vomiting and diarrhea. She saw her PCP for the same 4 days ago and was told that she will need to have an evaluation of her gallbladder, but that has not been done yet. The emesis is "white and foamy". The stool has been light-colored brown, and very thin, but this morning had some more consistency to it, with a single movement. No blood in emesis or diarrhea. No fever, chills, mid chest pain, shortness of breath, diaphoresis or dizziness. There are no other known modifying factors.   Past Medical History  Diagnosis Date  . Hiatal hernia   . Hypertension   . NSTEMI (non-ST elevated myocardial infarction) (HCC) 06/06/2014  . Family history of adverse reaction to anesthesia     "mom didn't want to wake up at all"  . Asthma   . GERD (gastroesophageal reflux disease)   . Chronic lower back pain   . Anxiety   . Depression    Past Surgical History  Procedure Laterality Date  . Fracture surgery    . Femur fracture surgery Left 2003    "metal rod runs from hip to knee"  . Cardiac catheterization N/A 06/07/2014    Procedure: Left Heart Cath and Coronary Angiography;  Surgeon: Lennette Bihari, MD;  Location: [facility] INVASIVE CV LAB;  Service: Cardiovascular;  Laterality: N/A;   Family History  Problem Relation Age of Onset  . Coronary artery disease Father 85    MI  . Coronary artery disease Mother 29    MI  . Heart attack Mother   . Heart attack Father   . Stroke Neg Hx   . Hypertension Mother   . Hypertension Father   . Diabetes Mother     Social History  Substance Use Topics  . Smoking status: Former Smoker -- 0.50 packs/day for 25 years    Types: Cigarettes  . Smokeless tobacco: Never Used     Comment: she uses vapor  no tobacco  . Alcohol Use: 0.0 oz/week    0 Standard drinks or equivalent per week     Comment: occasionally   OB History    No data available     Review of Systems  All other systems reviewed and are negative.     Allergies  Augmentin; Benadryl; and Penicillins  Home Medications   Prior to Admission medications   Medication Sig Start Date End Date Taking? Authorizing Provider  albuterol (PROVENTIL HFA;VENTOLIN HFA) 108 (90 BASE) MCG/ACT inhaler Inhale 2 puffs into the lungs every 6 (six) hours as needed for wheezing or shortness of breath.   Yes Historical Provider, MD  alprazolam Prudy Feeler) 2 MG tablet Take 0.5-2 mg by mouth 2 (two) times daily as needed for sleep or anxiety.   Yes Historical Provider, MD  aspirin EC 81 MG EC tablet Take 1 tablet (81 mg total) by mouth daily. 06/08/14  Yes Dwana Melena, PA-C  atorvastatin (LIPITOR) 80 MG tablet Take 1 tablet (80 mg total) by mouth daily at 6 PM. 09/16/14  Yes Dyann Kief, PA-C  buPROPion (  WELLBUTRIN SR) 150 MG 12 hr tablet Take 150 mg by mouth 2 (two) times daily.   Yes Historical Provider, MD  escitalopram (LEXAPRO) 20 MG tablet Take 20 mg by mouth daily.   Yes Historical Provider, MD  HYDROcodone-acetaminophen (NORCO) 10-325 MG per tablet Take 1 tablet by mouth every 6 (six) hours as needed for moderate pain.   Yes Historical Provider, MD  ipratropium-albuterol (DUONEB) 0.5-2.5 (3) MG/3ML SOLN Take 1.5 mLs by nebulization 3 (three) times daily as needed (shortness of breath).    Yes Historical Provider, MD  lisinopril (PRINIVIL,ZESTRIL) 2.5 MG tablet Take 1 tablet (2.5 mg total) by mouth daily. 09/16/14  Yes Dyann Kief, PA-C  methocarbamol (ROBAXIN) 750 MG tablet Take 750 mg by mouth every 6 (six) hours as needed for muscle spasms.   06/18/14  Yes Historical Provider, MD  metoprolol tartrate (LOPRESSOR) 25 MG tablet Take 0.5 tablets (12.5 mg total) by mouth 2 (two) times daily. 09/16/14  Yes Dyann Kief, PA-C  nitroGLYCERIN (NITROSTAT) 0.4 MG SL tablet Place 1 tablet (0.4 mg total) under the tongue every 5 (five) minutes x 3 doses as needed for chest pain. 09/16/14  Yes Dyann Kief, PA-C  omeprazole (PRILOSEC) 40 MG capsule Take 80 mg by mouth 2 (two) times daily.    Yes Historical Provider, MD  ticagrelor (BRILINTA) 90 MG TABS tablet Take 1 tablet (90 mg total) by mouth 2 (two) times daily. 09/16/14  Yes Dyann Kief, PA-C  etonogestrel (NEXPLANON) 68 MG IMPL implant Inject 1 each into the skin once. 06/2013    Historical Provider, MD  montelukast (SINGULAIR) 10 MG tablet Take 10 mg by mouth at bedtime.    Historical Provider, MD  oxyCODONE-acetaminophen (PERCOCET) 5-325 MG tablet Take 1-2 tablets by mouth every 4 (four) hours as needed for severe pain. 02/23/15   Mancel Bale, MD   BP 124/69 mmHg  Pulse 84  Temp(Src) 98.2 F (36.8 C) (Oral)  Resp 20  Ht  (1.702 m)  Wt 207 lb (93.895 kg)  BMI 32.41 kg/m2  SpO2 92% Physical Exam  Constitutional: She is oriented to person, place, and time. She appears well-developed and well-nourished. She appears distressed (Tearful, uncomfortable secondary to pain).  HENT:  Head: Normocephalic and atraumatic.  Right Ear: External ear normal.  Left Ear: External ear normal.  Eyes: Conjunctivae and EOM are normal. Pupils are equal, round, and reactive to light.  Neck: Normal range of motion and phonation normal. Neck supple.  Cardiovascular: Normal rate, regular rhythm and normal heart sounds.   Pulmonary/Chest: Effort normal and breath sounds normal. She exhibits no bony tenderness.  Abdominal: Soft. There is tenderness (id epigastric, and right upper quadrant tenderness, moderate.). There is no rebound and no guarding.  Genitourinary:  No costo-vertebral angle  tenderness with percussion  Musculoskeletal: Normal range of motion.  Neurological: She is alert and oriented to person, place, and time. No cranial nerve deficit or sensory deficit. She exhibits normal muscle tone. Coordination normal.  Skin: Skin is warm, dry and intact.  Psychiatric: She has a normal mood and affect. Her behavior is normal. Judgment and thought content normal.  Nursing note and vitals reviewed.   ED Course  Procedures (including critical care time)  Medications  HYDROmorphone (DILAUDID) injection 1 mg (1 mg Intravenous Given 02/23/15 1244)  ondansetron (ZOFRAN) injection 4 mg (4 mg Intravenous Given 02/23/15 1242)    Patient Vitals for the past 24 hrs:  BP Temp Temp src Pulse Resp SpO2  Height Weight  02/23/15 1500 124/69 mmHg - - 84 20 92 % - -  02/23/15 1445 134/76 mmHg - - 83 16 94 % - -  02/23/15 1430 133/82 mmHg - - 84 18 93 % - -  02/23/15 1414 123/66 mmHg - - 79 14 99 % - -  02/23/15 1230 134/78 mmHg - - 70 (!) 9 98 % - -  02/23/15 1215 139/79 mmHg - - 68 16 100 % - -  02/23/15 1208 134/82 mmHg - - 74 10 100 % - -  02/23/15 1057 122/94 mmHg 98.2 F (36.8 C) Oral 88 22 97 % 5\' 7"  (1.702 m) 207 lb (93.895 kg)    3:07 PM Reevaluation with update and discussion. After initial assessment and treatment, an updated evaluation reveals her pain is down to 2/10 at this time. She feels comfortable enough to go home. Findings discussed with patient and all questions were answered. Sumaiya Arruda L    Labs Review Labs Reviewed  BASIC METABOLIC PANEL - Abnormal; Notable for the following:    Glucose, Bld 143 (*)    BUN <5 (*)    All other components within normal limits  URINE RAPID DRUG SCREEN, HOSP PERFORMED - Abnormal; Notable for the following:    Opiates POSITIVE (*)    Benzodiazepines POSITIVE (*)    All other components within normal limits  CBC  HEPATIC FUNCTION PANEL  LIPASE, BLOOD  URINALYSIS, ROUTINE W REFLEX MICROSCOPIC (NOT AT [facility])  Rosezena Sensor, ED    Imaging Review Dg Chest 2 View  02/23/2015  CLINICAL DATA:  Lower right chest pain x 2 weeks, vomiting, SOB when she has the pain, coughing x 1 week, stent circumflex artery on June 2016 Hx of PNA and bronchitis, HTN, asthma, Hiatal hernia, NSTEMI EXAM: CHEST  2 VIEW COMPARISON:  06/06/2014 FINDINGS: Lungs are mildly hyperinflated. The heart size and mediastinal contours are within normal limits. Both lungs are clear. The visualized skeletal structures are unremarkable. IMPRESSION: No active cardiopulmonary disease. Electronically Signed   By: Norva Pavlov M.D.   On: 02/23/2015 12:23   US Abdomen Complete  02/23/2015  CLINICAL DATA:  Abdominal pain for 2 weeks, increasing last night. EXAM: ABDOMEN ULTRASOUND COMPLETE COMPARISON:  None. FINDINGS: Gallbladder: Shadowing gallstone noted in the gallbladder neck. There is an echogenic area in the gallbladder fundus that may reflect adherent sludge or large polyp. This measures approximately 1.3 cm in greatest dimension. No gallbladder wall thickening. No pericholecystic fluid. Common bile duct: Diameter: 5.3 mm Liver: No focal lesion identified. Within normal limits in parenchymal echogenicity. IVC: No abnormality visualized. Pancreas: Obscured by bowel gas. Spleen: Size and appearance within normal limits. Right Kidney: Length: 10.9 cm. Echogenicity within normal limits. No mass or hydronephrosis visualized. Left Kidney: Length: 11.2 cm. Echogenicity within normal limits. No mass or hydronephrosis visualized. Abdominal aorta: No aneurysm visualized. Other findings: None. IMPRESSION: 1. Gallstone within the gallbladder neck, but no gallbladder wall thickening or pericholecystic fluid to suggest acute cholecystitis. 2. 1.3 cm focal area of increased echogenicity in the gallbladder fundus. This could reflect adherent sludge, focal adenomyomatosis, a large polyp or possibly gallbladder carcinoma. Consider follow-up MRI with and without contrast  further assessment versus short-term follow-up ultrasound in 2 months. 3. No acute findings. Pancreas not visualized due to overlying bowel gas. Electronically Signed   By: Amie Portland M.D.   On: 02/23/2015 14:03   I have personally reviewed and evaluated these images and lab results as part of my  medical decision-making.   EKG Interpretation   Date/Time:  Sunday February 23 2015 10:48:09 EST Ventricular Rate:  94 PR Interval:  132 QRS Duration: 82 QT Interval:  380 QTC Calculation: 475 R Axis:   83 Text Interpretation:  Normal sinus rhythm with sinus arrhythmia  Nonspecific ST abnormality Abnormal ECG Artifact Serial tracing suggested  Confirmed by Akira Adelsberger  MD, Jalee Saine ([id number]) on 02/23/2015 11:16:58 AM      MDM   Final diagnoses:  Gallbladder disease    Abdominal pain, subacute, stabilizing the ED with treatment. Gallbladder stone, likely cause a symptom, without evidence for cholecystitis. As her pain is controlled. She is stable for discharge with outpatient management.  Nursing Notes Reviewed/ Care Coordinated Applicable Imaging Reviewed Interpretation of Laboratory Data incorporated into ED treatment  The patient appears reasonably screened and/or stabilized for discharge and I doubt any other medical condition or other [facility] requiring further screening, evaluation, or treatment in the ED at this time prior to discharge.  Plan: Home Medications- Percocet; Home Treatments- rest, low-fat diet; return here if the recommended treatment, does not improve the symptoms; Recommended follow up- Gen. Surg. asap     Mancel Bale, MD 02/23/15 [phone]

## 2015-02-26 ENCOUNTER — Telehealth: Payer: Self-pay | Admitting: *Deleted

## 2015-02-26 NOTE — Telephone Encounter (Signed)
Spoke with triage nurse at [facility]. Pt is mildly symptomatic but OK to monitor until June. Will fax this phone note to surgeon's office--[phone]

## 2015-02-26 NOTE — Telephone Encounter (Signed)
Pt is past due for follow up with Dr. Clifton James. I placed call to pt and left message to call to schedule appt.

## 2015-02-26 NOTE — Telephone Encounter (Signed)
She had a DES placed June 2016. Can we touch base with her to see the urgency of the surgery? I would prefer that she hold off on any surgeries until June 2017 if possible. Thayer Ohm

## 2015-02-26 NOTE — Telephone Encounter (Signed)
Received note from Dr. Andrey Campanile [facility]) requesting surgical clearance for laparoscopic cholecystectomy with IOC.  Will forward to Dr. Clifton James for review.

## 2015-03-03 ENCOUNTER — Ambulatory Visit (INDEPENDENT_AMBULATORY_CARE_PROVIDER_SITE_OTHER): Payer: BLUE CROSS/BLUE SHIELD | Admitting: Physician Assistant

## 2015-03-03 ENCOUNTER — Encounter: Payer: Self-pay | Admitting: Physician Assistant

## 2015-03-03 VITALS — BP 120/80 | HR 84 | Ht 67.0 in | Wt 213.6 lb

## 2015-03-03 DIAGNOSIS — I2510 Atherosclerotic heart disease of native coronary artery without angina pectoris: Secondary | ICD-10-CM

## 2015-03-03 DIAGNOSIS — Z720 Tobacco use: Secondary | ICD-10-CM | POA: Diagnosis not present

## 2015-03-03 DIAGNOSIS — K829 Disease of gallbladder, unspecified: Secondary | ICD-10-CM | POA: Diagnosis not present

## 2015-03-03 HISTORY — DX: Disease of gallbladder, unspecified: K82.9

## 2015-03-03 NOTE — Progress Notes (Signed)
Patient ID: Kristen Price, female   DOB: Jun 15, 1975, 40 y.o.   MRN: [id number]    Date:  03/03/2015   ID:  Kristen Price, DOB 1975/08/20, MRN [id number]  PCP:  Tomi Bamberger, NP  Primary Cardiologist:  Clifton James  Cardiologist:  Clifton James   History of Present Illness: Kristen Price is a 40 y.o. female with a history of a NSTEMI 06/06/14 treated with drug-eluting stent to the proximal circumflex. Echo revealed an EF of 60%. She was treated with aspirin, Lipitor, Brilinta, Lopressor and lisinopril. She continues to smoke however she's down to 3 cigarettes per day.    She was recently seen in the hospital was found to have gallbladder disease and is in need of gallbladder surgery.  She reports doing well and works as a Production designer, theatre/television/film at [employer]. She is constantly moving and lifting a lot of merchandise. She denies any chest pain or pressure. Her only issue at this point as her abdominal pain. She is eating a low-fat diet. She has had some nausea related to the gallbladder.  The patient currently denies vomiting, fever, shortness of breath, orthopnea, dizziness, PND, cough, congestion, abdominal pain, hematochezia, melena, lower extremity edema, claudication.  Wt Readings from Last 3 Encounters:  03/03/15 213 lb 9.6 oz (96.888 kg)  02/23/15 207 lb (93.895 kg)  09/16/14 197 lb 12 oz (89.699 kg)     Past Medical History  Diagnosis Date  . Hiatal hernia   . Hypertension   . NSTEMI (non-ST elevated myocardial infarction) (HCC) 06/06/2014  . Family history of adverse reaction to anesthesia     "mom didn't want to wake up at all"  . Asthma   . GERD (gastroesophageal reflux disease)   . Chronic lower back pain   . Anxiety   . Depression     Current Outpatient Prescriptions  Medication Sig Dispense Refill  . albuterol (PROVENTIL HFA;VENTOLIN HFA) 108 (90 BASE) MCG/ACT inhaler Inhale 2 puffs into the lungs every 6 (six) hours as needed for wheezing or shortness of breath.    . alprazolam (XANAX) 2 MG  tablet Take 0.5-2 mg by mouth 2 (two) times daily as needed for sleep or anxiety.    Marland Kitchen aspirin EC 81 MG EC tablet Take 1 tablet (81 mg total) by mouth daily.    Marland Kitchen atorvastatin (LIPITOR) 80 MG tablet Take 1 tablet (80 mg total) by mouth daily at 6 PM. 90 tablet 2  . buPROPion (WELLBUTRIN SR) 150 MG 12 hr tablet Take 150 mg by mouth 2 (two) times daily.    Marland Kitchen escitalopram (LEXAPRO) 20 MG tablet Take 20 mg by mouth daily.    Marland Kitchen etonogestrel (NEXPLANON) 68 MG IMPL implant Inject 1 each into the skin once. 06/2013    . HYDROcodone-acetaminophen (NORCO) 10-325 MG per tablet Take 1 tablet by mouth every 6 (six) hours as needed for moderate pain.    Marland Kitchen ipratropium-albuterol (DUONEB) 0.5-2.5 (3) MG/3ML SOLN Take 1.5 mLs by nebulization 3 (three) times daily as needed (shortness of breath).     Marland Kitchen lisinopril (PRINIVIL,ZESTRIL) 2.5 MG tablet Take 1 tablet (2.5 mg total) by mouth daily. 90 tablet 3  . methocarbamol (ROBAXIN) 750 MG tablet Take 750 mg by mouth every 6 (six) hours as needed for muscle spasms.     . metoprolol tartrate (LOPRESSOR) 25 MG tablet Take 0.5 tablets (12.5 mg total) by mouth 2 (two) times daily. 180 tablet 3  . montelukast (SINGULAIR) 10 MG tablet Take 10 mg by mouth at bedtime.    Marland Kitchen  nitroGLYCERIN (NITROSTAT) 0.4 MG SL tablet Place 1 tablet (0.4 mg total) under the tongue every 5 (five) minutes x 3 doses as needed for chest pain. 25 tablet 12  . omeprazole (PRILOSEC) 40 MG capsule Take 80 mg by mouth 2 (two) times daily.     Marland Kitchen oxyCODONE-acetaminophen (PERCOCET) 5-325 MG tablet Take 1-2 tablets by mouth every 4 (four) hours as needed for severe pain. 40 tablet 0  . ticagrelor (BRILINTA) 90 MG TABS tablet Take 1 tablet (90 mg total) by mouth 2 (two) times daily. 180 tablet 3   No current facility-administered medications for this visit.    Allergies:    Allergies  Allergen Reactions  . Augmentin [Amoxicillin-Pot Clavulanate] Nausea And Vomiting  . Benadryl [Diphenhydramine] Other (See  Comments)    Patient stated that it makes her "go nuts"  . Penicillins Nausea And Vomiting    Social History:  The patient  reports that she has quit smoking. Her smoking use included Cigarettes. She has a 12.5 pack-year smoking history. She has never used smokeless tobacco. She reports that she drinks alcohol. She reports that she does not use illicit drugs.   Family history:   Family History  Problem Relation Age of Onset  . Coronary artery disease Father 67    MI  . Coronary artery disease Mother 74    MI  . Heart attack Mother   . Heart attack Father   . Stroke Neg Hx   . Hypertension Mother   . Hypertension Father   . Diabetes Mother     ROS:  Please see the history of present illness.  All other systems reviewed and negative.   PHYSICAL EXAM: VS:  BP 120/80 mmHg  Pulse 84  Ht  (1.702 m)  Wt 213 lb 9.6 oz (96.888 kg)  BMI 33.45 kg/m2 Obese, well developed, in no acute distress HEENT: Pupils are equal round react to light accommodation extraocular movements are intact.  Neck: no JVDNo cervical lymphadenopathy. Cardiac: Regular rate and rhythm without murmurs rubs or gallops. Lungs:  clear to auscultation bilaterally, no wheezing, rhonchi or rales Abd: soft, tender in the right upper quadrant, positive bowel sounds all quadrants, no hepatosplenomegaly Ext: no lower extremity edema.  2+ radial and dorsalis pedis pulses. Skin: warm and dry Neuro:  Grossly normal    ASSESSMENT AND PLAN:  Problem List Items Addressed This Visit    Tobacco abuse   Gallbladder disease   Coronary artery disease involving native coronary artery of native heart without angina pectoris - Primary     Patient presents today because she needs cholecystectomy and she had a stent placed to her circumflex artery back in June 2016. We recommended that she stay on Brilinta for at least 12 months, which means she can have her surgery in June. Currently her blood pressures well-controlled. She's  had no symptoms of chest pain and she has a very physically demanding job at [employer].  We further discussed tobacco cessation.

## 2015-03-03 NOTE — Patient Instructions (Signed)
Medication Instructions:  Your physician recommends that you continue on your current medications as directed. Please refer to the Current Medication list given to you today.   Labwork: None ordered  Testing/Procedures: None ordered  Follow-Up: Your physician wants you to follow-up in: 6 months with Dr.Mcalhany You will receive a reminder letter in the mail two months in advance. If you don't receive a letter, please call our office to schedule the follow-up appointment.   Any Other Special Instructions Will Be Listed Below (If Applicable).     If you need a refill on your cardiac medications before your next appointment, please call your pharmacy.   

## 2015-06-03 ENCOUNTER — Telehealth: Payer: Self-pay | Admitting: Cardiovascular Disease

## 2015-06-03 NOTE — Telephone Encounter (Signed)
Kristen BiblePat, She can hold the Brilinta one week before her planned surgical procedure. Coronary stent was placed June 06, 2014 so one year of dual anti-platelet therapy has been completed. Can we fax this to the surgeon's office and let her know. We will most likely start Plavix instead of Brilinta after her surgical procedure.   [name], MD 06/03/2015 12:01 PM

## 2015-06-03 NOTE — Telephone Encounter (Signed)
Request for surgical clearance:  What type of surgery is being performed? Gall Bladder Removal   1. When is this surgery scheduled? Pending   2. Are there any medications that need to be held prior to surgery and how long?Brilinta    3. Name of physician performing surgery? Dr.Wilson   4. What is your office phone and fax number? [phone] fax#[phone]

## 2015-06-04 ENCOUNTER — Ambulatory Visit: Payer: Self-pay | Admitting: General Surgery

## 2015-06-04 NOTE — Telephone Encounter (Signed)
Reviewed with Dr. Clifton JamesMcAlhany and pt should resume Brilinta after surgery when safe from a surgical standpoint. One week prior to completing the supply of Brilinta she currently has she should contact our office.  At that time Marden NobleBrilinta will be stopped and a new prescription for Plavix will be sent to her pharmacy.  [facility] and gave her this information. Will fax this note to their office.  I placed call to pt and left message to call back.

## 2015-06-05 NOTE — Telephone Encounter (Signed)
Left message to call back  

## 2015-06-11 NOTE — Telephone Encounter (Signed)
Pt notified.  She will call us when she runs low on Brilinta and change can be made at that time.

## 2015-06-11 NOTE — Telephone Encounter (Signed)
Left message to call back  

## 2015-08-15 ENCOUNTER — Encounter (HOSPITAL_COMMUNITY): Payer: Self-pay | Admitting: *Deleted

## 2015-08-15 ENCOUNTER — Ambulatory Visit (HOSPITAL_COMMUNITY)
Admission: EM | Admit: 2015-08-15 | Discharge: 2015-08-15 | Disposition: A | Discharge status: Home / Self Care | Payer: BLUE CROSS/BLUE SHIELD | Attending: Family Medicine | Admitting: Family Medicine

## 2015-08-15 DIAGNOSIS — H7292 Unspecified perforation of tympanic membrane, left ear: Secondary | ICD-10-CM

## 2015-08-15 MED ORDER — IPRATROPIUM BROMIDE 0.06 % NA SOLN: 2.0000 | Freq: Four times a day (QID) | NASAL | 1 refills | Status: DC

## 2015-08-15 MED ORDER — CIPROFLOXACIN-DEXAMETHASONE 0.3-0.1 % OT SUSP: 4.0000 [drp] | Freq: Two times a day (BID) | OTIC | 0 refills | Status: DC

## 2015-08-15 NOTE — ED Triage Notes (Signed)
Pt  Reports   Last pm   She  Noticed  Bleeding  From the   l  Ear  Associated   With  Sinus  Congestion  Pressure   And  Pain  Behind     Her  Eyes   The  Pt     Reports  That   She  Has a  History  Of  Allergies  And  Sinus  Problems

## 2015-08-15 NOTE — Discharge Instructions (Signed)
Use medicine as prescribed , keep water out of ear, see dr byers next week, call sooner if any problems.

## 2015-08-15 NOTE — ED Provider Notes (Signed)
[facility]    CSN: [id number] Arrival date & time: 08/15/15  1146  First Provider Contact:  First MD Initiated Contact with Patient 08/15/15 1311        History   Chief Complaint Chief Complaint  Patient presents with  . Ear Problem    HPI Kristen Price is a 40 y.o. female.    Ear Drainage  This is a new problem. The current episode started yesterday (sudden pain in left ear last eve with sub blood from canal.). The problem has not changed since onset.Pertinent negatives include no chest pain, no abdominal pain and no headaches.    Past Medical History:  Diagnosis Date  . Anxiety   . Asthma   . Chronic lower back pain   . Depression   . Family history of adverse reaction to anesthesia    "mom didn't want to wake up at all"  . GERD (gastroesophageal reflux disease)   . Hiatal hernia   . Hypertension   . NSTEMI (non-ST elevated myocardial infarction) (HCC) 06/06/2014    Patient Active Problem List   Diagnosis Date Noted  . Gallbladder disease 03/03/2015  . Coronary artery disease involving native coronary artery of native heart without angina pectoris 06/24/2014  . Elevated troponin   . History of non-ST elevation myocardial infarction (NSTEMI) 06/06/2014  . Tobacco abuse 06/06/2014    Past Surgical History:  Procedure Laterality Date  . CARDIAC CATHETERIZATION N/A 06/07/2014   Procedure: Left Heart Cath and Coronary Angiography;  Surgeon: Lennette Bihari, MD;  Location: [facility] INVASIVE CV LAB;  Service: Cardiovascular;  Laterality: N/A;  . FEMUR FRACTURE SURGERY Left 2003   "metal rod runs from hip to knee"  . FRACTURE SURGERY      OB History    No data available       Home Medications    Prior to Admission medications   Medication Sig Start Date End Date Taking? Authorizing Provider  albuterol (PROVENTIL HFA;VENTOLIN HFA) 108 (90 BASE) MCG/ACT inhaler Inhale 2 puffs into the lungs every 6 (six) hours as needed for wheezing or shortness of breath.     Historical Provider, MD  alprazolam Prudy Feeler) 2 MG tablet Take 0.5-2 mg by mouth 2 (two) times daily as needed for sleep or anxiety.    Historical Provider, MD  aspirin EC 81 MG EC tablet Take 1 tablet (81 mg total) by mouth daily. 06/08/14   Dwana Melena, PA-C  atorvastatin (LIPITOR) 80 MG tablet Take 1 tablet (80 mg total) by mouth daily at 6 PM. 09/16/14   Dyann Kief, PA-C  buPROPion [facility] SR) 150 MG 12 hr tablet Take 150 mg by mouth 2 (two) times daily.    Historical Provider, MD  escitalopram (LEXAPRO) 20 MG tablet Take 20 mg by mouth daily.    Historical Provider, MD  etonogestrel (NEXPLANON) 68 MG IMPL implant Inject 1 each into the skin once. 06/2013    Historical Provider, MD  HYDROcodone-acetaminophen (NORCO) 10-325 MG per tablet Take 1 tablet by mouth every 6 (six) hours as needed for moderate pain.    Historical Provider, MD  ipratropium-albuterol (DUONEB) 0.5-2.5 (3) MG/3ML SOLN Take 1.5 mLs by nebulization 3 (three) times daily as needed (shortness of breath).     Historical Provider, MD  lisinopril (PRINIVIL,ZESTRIL) 2.5 MG tablet Take 1 tablet (2.5 mg total) by mouth daily. 09/16/14   Dyann Kief, PA-C  methocarbamol (ROBAXIN) 750 MG tablet Take 750 mg by mouth every 6 (six) hours  as needed for muscle spasms.  06/18/14   Historical Provider, MD  metoprolol tartrate (LOPRESSOR) 25 MG tablet Take 0.5 tablets (12.5 mg total) by mouth 2 (two) times daily. 09/16/14   Dyann KiefMichele M Lenze, PA-C  montelukast (SINGULAIR) 10 MG tablet Take 10 mg by mouth at bedtime.    Historical Provider, MD  nitroGLYCERIN (NITROSTAT) 0.4 MG SL tablet Place 1 tablet (0.4 mg total) under the tongue every 5 (five) minutes x 3 doses as needed for chest pain. 09/16/14   Dyann KiefMichele M Lenze, PA-C  omeprazole (PRILOSEC) 40 MG capsule Take 80 mg by mouth 2 (two) times daily.     Historical Provider, MD  oxyCODONE-acetaminophen (PERCOCET) 5-325 MG tablet Take 1-2 tablets by mouth every 4 (four) hours as needed for  severe pain. 02/23/15   Mancel BaleElliott Wentz, MD  ticagrelor (BRILINTA) 90 MG TABS tablet Take 1 tablet (90 mg total) by mouth 2 (two) times daily. 09/16/14   Dyann KiefMichele M Lenze, PA-C    Family History Family History  Problem Relation Age of Onset  . Coronary artery disease Mother 5554    MI  . Heart attack Mother   . Hypertension Mother   . Diabetes Mother   . Coronary artery disease Father [id number]    MI  . Heart attack Father   . Hypertension Father   . Stroke Neg Hx     Social History Social History  Substance Use Topics  . Smoking status: Former Smoker    Packs/day: 0.50    Years: 25.00    Types: Cigarettes  . Smokeless tobacco: Never Used     Comment: she uses vapor 6mg  no tobacco  . Alcohol use 0.0 oz/week     Comment: occasionally     Allergies   Augmentin [amoxicillin-pot clavulanate]; Benadryl [diphenhydramine]; and Penicillins   Review of Systems Review of Systems  Constitutional: Negative.   HENT: Positive for congestion, ear discharge, ear pain, postnasal drip and rhinorrhea. Negative for sore throat.   Respiratory: Negative.   Cardiovascular: Negative.  Negative for chest pain.  Gastrointestinal: Negative for abdominal pain.  Neurological: Negative for headaches.  All other systems reviewed and are negative.    Physical Exam Triage Vital Signs ED Triage Vitals [08/15/15 1230]  Enc Vitals Group     BP 132/76     Pulse Rate 78     Resp 14     Temp 98.6 F (37 C)     Temp Source Oral     SpO2      Weight      Height      Head Circumference      Peak Flow      Pain Score      Pain Loc      Pain Edu?      Excl. in GC?    No data found.   Updated Vital Signs BP 132/76 (BP Location: Left Arm)   Pulse 78   Temp 98.6 F (37 C) (Oral)   Resp 14   Visual Acuity Right Eye Distance:   Left Eye Distance:   Bilateral Distance:    Right Eye Near:   Left Eye Near:    Bilateral Near:     Physical Exam  Constitutional: She appears well-developed and  well-nourished. She appears distressed.  HENT:  Right Ear: External ear normal.  Left Ear: There is hemotympanum. Decreased hearing is noted.  Ears:  Mouth/Throat: Oropharynx is clear and moist.  Eyes: Conjunctivae and EOM are normal. Pupils  are equal, round, and reactive to light.  Neck: Normal range of motion. Neck supple.  Cardiovascular: Normal rate, regular rhythm, normal heart sounds and intact distal pulses.   Pulmonary/Chest: Effort normal and breath sounds normal.  Nursing note and vitals reviewed.    UC Treatments / Results  Labs (all labs ordered are listed, but only abnormal results are displayed) Labs Reviewed - No data to display  EKG  EKG Interpretation None       Radiology No results found.  Procedures Procedures (including critical care time)  Medications Ordered in UC Medications - No data to display   Initial Impression / Assessment and Plan / UC Course  I have reviewed the triage vital signs and the nursing notes.  Pertinent labs & imaging results that were available during my care of the patient were reviewed by me and considered in my medical decision making (see chart for details).  Clinical Course   Discussed with dr Jearld Fenton, plans as noted.   Final Clinical Impressions(s) / UC Diagnoses   Final diagnoses:  None    New Prescriptions New Prescriptions   No medications on file     Linna Hoff, MD 08/15/15 1339

## 2015-08-19 DIAGNOSIS — H9012 Conductive hearing loss, unilateral, left ear, with unrestricted hearing on the contralateral side: Secondary | ICD-10-CM | POA: Insufficient documentation

## 2015-08-19 DIAGNOSIS — H9222 Otorrhagia, left ear: Secondary | ICD-10-CM | POA: Insufficient documentation

## 2015-09-03 ENCOUNTER — Other Ambulatory Visit: Payer: Self-pay | Admitting: Physician Assistant

## 2015-12-02 ENCOUNTER — Other Ambulatory Visit: Payer: Self-pay | Admitting: Physician Assistant

## 2016-02-26 ENCOUNTER — Other Ambulatory Visit: Payer: Self-pay | Admitting: Physician Assistant

## 2016-03-28 ENCOUNTER — Other Ambulatory Visit: Payer: Self-pay | Admitting: Physician Assistant

## 2016-07-19 ENCOUNTER — Telehealth: Payer: Self-pay

## 2016-07-21 MED ORDER — CLOPIDOGREL BISULFATE 75 MG PO TABS: 75.0000 mg | ORAL_TABLET | Freq: Every day | ORAL | 2 refills | Status: DC

## 2016-07-21 MED ORDER — PANTOPRAZOLE SODIUM 40 MG PO TBEC: 40.0000 mg | DELAYED_RELEASE_TABLET | Freq: Two times a day (BID) | ORAL | 2 refills | Status: DC

## 2016-07-21 NOTE — Telephone Encounter (Signed)
Chart reviewed and telephone note from 06/03/15 indicates pt was to finish current supply of Brilinta and change to Plavix.  I reviewed with Landis MartinsKelly Auten, Pharm D and upon making change to Plavix pt will need to stop omeprazole and change to pantoprazole 40 mg by mouth twice daily.  Pt also needs to schedule follow up appointment in our office.  I placed call to pt and left message to call back.

## 2016-07-21 NOTE — Telephone Encounter (Signed)
I spoke with pt who reports she did not have gall bladder surgery last year.  She has been taking Brilinta and has supply through end of next week.  I told her to continue Brilinta until she finishes current supply and at that time change to Clopidogrel 75 mg by mouth daily.  I told her she will also need to stop omeprazole and start pantoprazole 40 mg by mouth twice daily.  I explained to pt she is due for follow up in our office and offered to make appt for her but she would like to call back to schedule this.  I told her I would send 2 month supply of Clopidgrel/[facility] to [facility] [address] and that she should call us back to schedule appointment. She is not having any cardiac complaints.

## 2016-09-21 ENCOUNTER — Institutional Professional Consult (permissible substitution): Payer: BLUE CROSS/BLUE SHIELD | Admitting: Internal Medicine

## 2016-10-18 ENCOUNTER — Other Ambulatory Visit: Payer: Self-pay | Admitting: Cardiovascular Disease

## 2016-11-01 ENCOUNTER — Other Ambulatory Visit: Payer: Self-pay | Admitting: Cardiovascular Disease

## 2016-11-08 ENCOUNTER — Other Ambulatory Visit: Payer: Self-pay | Admitting: Cardiovascular Disease

## 2016-11-15 ENCOUNTER — Other Ambulatory Visit: Payer: Self-pay | Admitting: Cardiovascular Disease

## 2016-11-15 MED ORDER — PANTOPRAZOLE SODIUM 40 MG PO TBEC: 40.0000 mg | DELAYED_RELEASE_TABLET | Freq: Two times a day (BID) | ORAL | 2 refills | Status: DC

## 2016-11-15 MED ORDER — CLOPIDOGREL BISULFATE 75 MG PO TABS: 75.0000 mg | ORAL_TABLET | Freq: Every day | ORAL | 2 refills | Status: DC

## 2016-11-15 NOTE — Telephone Encounter (Signed)
New message     Pt does not having insurance and can not come in for an appointment until it kicks in January , can you fill her medication until January and she will get an appointment

## 2016-11-15 NOTE — Telephone Encounter (Signed)
Patient has an appointment in February with Dr. Clifton JamesMcAlhany. Patient needs refill on Plavix and Protonix. Patient has not been seen since February 2017. Patient stated she will have to wait until after January to see the cardiologist because she does not have insurance at this time. Encouraged patient to have PCP to refill her medications. Patient stated her PCP has refused to refill, because she was not the original prescriber.  Informed patient that her request will be sent to Dr. Clifton JamesMcAlhany.

## 2016-11-15 NOTE — Telephone Encounter (Signed)
Left message for patient to call back  

## 2016-11-15 NOTE — Telephone Encounter (Signed)
OK to refill. Thanks for your help. Thayer Ohmhris

## 2016-11-15 NOTE — Telephone Encounter (Signed)
Per Antonietta BreachPam Ingalls, RN this has been completed and refills sent into pharmacy as requested.

## 2017-01-04 DIAGNOSIS — E119 Type 2 diabetes mellitus without complications: Secondary | ICD-10-CM

## 2017-01-04 HISTORY — PX: HX GALL BLADDER SURGERY/CHOLE: SHX55

## 2017-01-04 HISTORY — DX: Type 2 diabetes mellitus without complications: E11.9

## 2017-01-04 HISTORY — PX: CHOLECYSTECTOMY: SHX55

## 2017-02-08 ENCOUNTER — Telehealth: Payer: Self-pay | Admitting: Cardiovascular Disease

## 2017-02-08 ENCOUNTER — Other Ambulatory Visit: Payer: Self-pay | Admitting: Cardiovascular Disease

## 2017-02-08 ENCOUNTER — Ambulatory Visit: Payer: Self-pay | Admitting: Physician Assistant

## 2017-02-08 MED ORDER — LISINOPRIL 2.5 MG PO TABS: 2.5000 mg | ORAL_TABLET | Freq: Every day | ORAL | 0 refills | Status: DC

## 2017-02-08 MED ORDER — CLOPIDOGREL BISULFATE 75 MG PO TABS: 75.0000 mg | ORAL_TABLET | Freq: Every day | ORAL | 0 refills | Status: DC

## 2017-02-08 MED ORDER — ATORVASTATIN CALCIUM 80 MG PO TABS: ORAL_TABLET | ORAL | 0 refills | Status: DC

## 2017-02-08 NOTE — Telephone Encounter (Signed)
°*  STAT* If patient is at the pharmacy, call can be transferred to refill team.   1. Which medications need to be refilled? (please list name of each medication and dose if known) Clopidigrel 75 mg  2. Which pharmacy/location (including street and city if local pharmacy) is medication to be sent to?[id number] [address] [place] - [address]  3. Do they need a 30 day or 90 day supply? 30

## 2017-02-08 NOTE — Telephone Encounter (Signed)
Pt's medication was sent to pt's pharmacy as requested. Confirmation received.  °

## 2017-02-08 NOTE — Telephone Encounter (Signed)
Patient would like to verify if she needs fasting labs

## 2017-02-08 NOTE — Telephone Encounter (Signed)
Pt was calling for a refill of her clopidogrel, atorvastatin, and lisinopril, up to her follow-up appt with Maryruth Hancockayna Dunn PA-C, on 02/22/17.  Gave the pt refills of all meds up to that date.  [facility] [address] [place].  Advised the pt to keep her follow-up appt, so she can receive further refills.  Pt verbalized understanding and agrees with this plan.

## 2017-02-08 NOTE — Progress Notes (Deleted)
Cardiology Office Note    Date:  02/08/2017   ID:  Kristen Price, DOB 1975/02/15, MRN [id number]  PCP:  Tomi BambergerFuller, Susan, NP (Inactive)  Cardiologist: No primary care provider on file.  No chief complaint on file.   History of Present Illness:  Kristen Price is a 42 y.o. female with history of CAD status post NSTEMI 06/06/14 treated with DES to the proximal circumflex, LVEF 60% on echo.  Brilinta was changed to Plavix in 2017.  Last seen by Huey BienenstockBrian Hager, PA-C 02/2015 with still smoking and cleared for colonoscopy.    Past Medical History:  Diagnosis Date  . Anxiety   . Asthma   . Chronic lower back pain   . Depression   . Family history of adverse reaction to anesthesia    "mom didn't want to wake up at all"  . GERD (gastroesophageal reflux disease)   . Hiatal hernia   . Hypertension   . NSTEMI (non-ST elevated myocardial infarction) (HCC) 06/06/2014    Past Surgical History:  Procedure Laterality Date  . CARDIAC CATHETERIZATION N/A 06/07/2014   Procedure: Left Heart Cath and Coronary Angiography;  Surgeon: Lennette Biharihomas A Kelly, MD;  [facility];  Service: Cardiovascular;  Laterality: N/A;  . FEMUR FRACTURE SURGERY Left 2003   "metal rod runs from hip to knee"  . FRACTURE SURGERY      Current Medications: No outpatient medications have been marked as taking for the 02/08/17 encounter (Appointment) with Dyann KiefLenze, Michele M, PA-C.     Allergies:   Augmentin [amoxicillin-pot clavulanate]; Benadryl [diphenhydramine]; and Penicillins   Social History   Socioeconomic History  . Marital status: Married    Spouse name: Not on file  . Number of children: Not on file  . Years of education: Not on file  . Highest education level: Not on file  Social Needs  . Financial resource strain: Not on file  . Food insecurity - worry: Not on file  . Food insecurity - inability: Not on file  . Transportation needs - medical: Not on file  . Transportation needs - non-medical: Not on  file  Occupational History  . Not on file  Tobacco Use  . Smoking status: Former Smoker    Packs/day: 0.50    Years: 25.00    Pack years: 12.50    Types: Cigarettes  . Smokeless tobacco: Never Used  . Tobacco comment: she uses vapor 6mg  no tobacco  Substance and Sexual Activity  . Alcohol use: Yes    Alcohol/week: 0.0 oz    Comment: occasionally  . Drug use: No  . Sexual activity: Not Currently    Birth control/protection: Implant  Other Topics Concern  . Not on file  Social History Narrative  . Not on file     Family History:  The patient's ***family history includes Coronary artery disease (age of onset: 1951) in her father; Coronary artery disease (age of onset: 254) in her mother; Diabetes in her mother; Heart attack in her father and mother; Hypertension in her father and mother.   ROS:   Please see the history of present illness.    ROS All other systems reviewed and are negative.   PHYSICAL EXAM:   VS:  There were no vitals taken for this visit.  Physical Exam  GEN: Well nourished, well developed, in no acute distress  HEENT: normal  Neck: no JVD, carotid bruits, or masses Cardiac:RRR; no murmurs, rubs, or gallops  Respiratory:  clear to auscultation bilaterally,  normal work of breathing GI: soft, nontender, nondistended, + BS Ext: without cyanosis, clubbing, or edema, Good distal pulses bilaterally MS: no deformity or atrophy  Skin: warm and dry, no rash Neuro:  Alert and Oriented x 3, Strength and sensation are intact Psych: euthymic mood, full affect  Wt Readings from Last 3 Encounters:  03/03/15 213 lb 9.6 oz (96.9 kg)  02/23/15 207 lb (93.9 kg)  09/16/14 197 lb 12 oz (89.7 kg)      Studies/Labs Reviewed:   EKG:  EKG is*** ordered today.  The ekg ordered today demonstrates ***  Recent Labs: No results found for requested labs within last 8760 hours.   Lipid Panel    Component Value Date/Time   CHOL 129 09/16/2014 1208   TRIG 108.0 09/16/2014  1208   HDL 30.80 (L) 09/16/2014 1208   CHOLHDL 4 09/16/2014 1208   VLDL 21.6 09/16/2014 1208   LDLCALC 77 09/16/2014 1208    Additional studies/ records that were reviewed today include:  Cardiac cath 6/3/16Procedures   Left Heart Cath and Coronary Angiography  Conclusion    1st Diag lesion, 20% stenosed.  Prox Cx lesion, 90% stenosed. There is a 0% residual stenosis post intervention.  A drug-eluting stent was placed.   Preserved global LV contractility with an ejection fraction of 55% but with evidence for small focal region of mid to basal inferior hypocontractility.   Predominant single-vessel coronary obstructive disease with evidence for mild 20% narrowing in the very proximal first diagonal branch of the LAD with an otherwise normal LAD system; 90% percent eccentric stenosis in the proximal left circumflex coronary artery arising immediately after the left atrial circumflex branch and a dominant left circumflex system.  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   Successful PCI of the left circumflex coronary artery with insertion of a 3.518 mm Resolute DES stent postdilated to 3.76 mm with the 90% stenosis being reduced to 0%.   RECOMMENDATION:   The patient will continue with antiplatelets therapy for minimum of a year.  She was screened to participate in the Twilight study involving Brilinta/aspirin.  Smoking cessation is imperative.  Aggressive lipid-lowering therapy will be instituted along with medical therapy for her CAD.      2D echo 6/3/16Study Conclusions   - Left ventricle: There may be mild decreased thickening at the   base of the inferior wall. The cavity size was normal. Wall   thickness was normal. The estimated ejection fraction was 60%. - Right ventricle: The cavity size was normal. Systolic function   was normal.       ASSESSMENT:    1. Coronary artery disease involving native coronary artery of native heart without angina pectoris   2. Tobacco abuse      PLAN:  In order of problems listed above:  CAD status post NSTEMI treated with DES to the circumflex 06/2014  Tobacco abuse    Medication Adjustments/Labs and Tests Ordered: Current medicines are reviewed at length with the patient today.  Concerns regarding medicines are outlined above.  Medication changes, Labs and Tests ordered today are listed in the Patient Instructions below. There are no Patient Instructions on file for this visit.   Elson Clan, PA-C  02/08/2017 8:52 AM    [facility] [address] [place], [place], [place]  [address] Phone: [phone]; Fax: [phone]

## 2017-02-14 ENCOUNTER — Ambulatory Visit: Payer: Self-pay | Admitting: Family Medicine

## 2017-02-17 ENCOUNTER — Ambulatory Visit: Payer: Self-pay | Admitting: Family Medicine

## 2017-02-18 ENCOUNTER — Encounter: Payer: Self-pay | Admitting: Family Medicine

## 2017-02-18 ENCOUNTER — Other Ambulatory Visit: Payer: Self-pay

## 2017-02-18 ENCOUNTER — Ambulatory Visit: Payer: Self-pay | Admitting: Family Medicine

## 2017-02-18 VITALS — BP 122/84 | HR 115 | Temp 98.5°F | Ht 67.0 in | Wt 271.0 lb

## 2017-02-18 DIAGNOSIS — J302 Other seasonal allergic rhinitis: Secondary | ICD-10-CM

## 2017-02-18 DIAGNOSIS — K219 Gastro-esophageal reflux disease without esophagitis: Secondary | ICD-10-CM

## 2017-02-18 DIAGNOSIS — G250 Essential tremor: Secondary | ICD-10-CM

## 2017-02-18 DIAGNOSIS — I2510 Atherosclerotic heart disease of native coronary artery without angina pectoris: Secondary | ICD-10-CM

## 2017-02-18 DIAGNOSIS — J0100 Acute maxillary sinusitis, unspecified: Secondary | ICD-10-CM

## 2017-02-18 DIAGNOSIS — J4520 Mild intermittent asthma, uncomplicated: Secondary | ICD-10-CM

## 2017-02-18 MED ORDER — MONTELUKAST SODIUM 10 MG PO TABS: 10.0000 mg | ORAL_TABLET | Freq: Every day | ORAL | 5 refills | Status: DC

## 2017-02-18 MED ORDER — IPRATROPIUM-ALBUTEROL 0.5-2.5 (3) MG/3ML IN SOLN: 3.0000 mL | Freq: Four times a day (QID) | RESPIRATORY_TRACT | 2 refills | Status: DC | PRN

## 2017-02-18 MED ORDER — CLOPIDOGREL BISULFATE 75 MG PO TABS: 75.0000 mg | ORAL_TABLET | Freq: Every day | ORAL | 0 refills | Status: DC

## 2017-02-18 MED ORDER — AZELASTINE HCL 0.1 % NA SOLN: 1.0000 | Freq: Two times a day (BID) | NASAL | 2 refills | Status: DC

## 2017-02-18 MED ORDER — ATORVASTATIN CALCIUM 80 MG PO TABS: ORAL_TABLET | ORAL | 0 refills | Status: DC

## 2017-02-18 MED ORDER — PANTOPRAZOLE SODIUM 40 MG PO TBEC: 40.0000 mg | DELAYED_RELEASE_TABLET | Freq: Two times a day (BID) | ORAL | 2 refills | Status: DC

## 2017-02-18 MED ORDER — ALPRAZOLAM 2 MG PO TABS: 1.0000 mg | ORAL_TABLET | Freq: Two times a day (BID) | ORAL | 2 refills | Status: DC | PRN

## 2017-02-18 MED ORDER — DOXYCYCLINE HYCLATE 100 MG PO TABS: 100.0000 mg | ORAL_TABLET | Freq: Two times a day (BID) | ORAL | 0 refills | Status: DC

## 2017-02-18 NOTE — Patient Instructions (Addendum)
1. Start flonase 1 spray twice a day 2. Nasal saline washes twice a day 3. Consider adding an oral decongestant such as phenylephrine (does not affect blood pressure as much as sudafed)  To make your own saline mixture, combine about 16 ounces (1 pint) of lukewarm water (distilled, sterile, or previously boiled) with 1 teaspoon of salt. You can add 1/2 teaspoon of baking soda to buffer the solution -- that'll make it a little gentler on your nose.   IF you received an x-ray today, you will receive an invoice from [facility]. Please contact [facility] at [phone] with questions or concerns regarding your invoice.   IF you received labwork today, you will receive an invoice from [facility]. Please contact [facility] at [phone] with questions or concerns regarding your invoice.   Our billing staff will not be able to assist you with questions regarding bills from these companies.  You will be contacted with the lab results as soon as they are available. The fastest way to get your results is to activate your My Chart account. Instructions are located on the last page of this paperwork. If you have not heard from us regarding the results in 2 weeks, please contact this office.

## 2017-02-18 NOTE — Progress Notes (Signed)
2/15/20194:56 PM  Kristen Price [birth year], 42 y.o. female [id number]  Chief Complaint  Patient presents with  . Establish Care    NEEDS REFILLS ON MEDS    HPI:   Patient is a 42 y.o. female with past medical history significant for CAD s/p NSTEMI w stent, anxiety, asthma, GERD who presents today to establish care.  Patient uninsured, has not seen cards nor GI in over a year EF in 2016 was normal She quit smoking in dec 2017, prior 1.5 -2ppd x 30 years Denies any chest pain, orthopnea, edema   Patient has GERD , well controlled on protonix  Her asthma has not been doing well recently, reports nocturnal wheezing with chest tightness, has never been told she has COPD, has never been on inhalers, does have a nebulizer machine and does well with duonebs. She is also requesting an albuterol inhaler in case needed when not at home. In the past she has also been on singulair as she does have allergies. She also reports worsening right facial pain, thick nasal drainage for the past several weeks.   Reports used to have significant depression and anxiety in 2016, related to unforseen divorce to spouse of 17 years. she has been without wellbutrin and lexapro for a month and reports normal mood. She reports she takes alprazolam for tremors not mood.  Takes alprazolam most days of the week, between 1/2 - 1.5mg  a day, reports tremors only in hands, most noticeable to her when she typing or writing. 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 Her grandmother also had tremors of her hands. PMP reviewed, appropriate, last ativan rx 01/21/17, opiates/tramadol, rare rx for acute issues.   [facility], reports [facility] date of 6/02/11/17  Depression screen PHQ 2/9 02/18/2017  Decreased Interest 0  Down, Depressed, Hopeless 0  PHQ - 2 Score 0    Allergies  Allergen Reactions  . Augmentin [Amoxicillin-Pot Clavulanate] Nausea And Vomiting  . Benadryl [Diphenhydramine] Other (See  Comments)    Patient stated that it makes her "go nuts"  . Penicillins Nausea And Vomiting    Prior to Admission medications   Medication Sig Start Date End Date Taking? Authorizing Provider  alprazolam Prudy Feeler(XANAX) 2 MG tablet Take 0.5-2 mg by mouth 2 (two) times daily as needed for sleep or anxiety.    [provider]  aspirin EC 81 MG EC tablet Take 1 tablet (81 mg total) by mouth daily. 06/08/14   Dwana MelenaHager, Bryan W, PA-C  atorvastatin (LIPITOR) 80 MG tablet TAKE 1 TABLET BY MOUTH EVERY DAY AT [facility]6PM 02/08/17   Kathleene HazelMcAlhany, Christopher D, MD  buPROPion [facility](WELLBUTRIN SR) 150 MG 12 hr tablet Take 150 mg by mouth 2 (two) times daily.    [provider]  clopidogrel (PLAVIX) 75 MG tablet Take 1 tablet (75 mg total) by mouth daily. 02/08/17   Kathleene HazelMcAlhany, Christopher D, MD  escitalopram (LEXAPRO) 20 MG tablet Take 20 mg by mouth daily.    [provider]  etonogestrel (NEXPLANON) 68 MG IMPL implant Inject 1 each into the skin once. 06/2013    [provider]  ipratropium-albuterol (DUONEB) 0.5-2.5 (3) MG/3ML SOLN Take 1.5 mLs by nebulization 3 (three) times daily as needed (shortness of breath).     [provider]  lisinopril (PRINIVIL,ZESTRIL) 2.5 MG tablet Take 1 tablet (2.5 mg total) by mouth daily. Patient not taking: Reported on 02/18/2017 02/08/17   Kathleene HazelMcAlhany, Christopher D, MD  metoprolol tartrate (LOPRESSOR) 25 MG tablet  Take 0.5 tablets (12.5 mg total) by mouth 2 (two) times daily. Patient not taking: Reported on 02/18/2017 09/16/14   Dyann Kief, PA-C  montelukast (SINGULAIR) 10 MG tablet Take 10 mg by mouth at bedtime.    [provider]  nitroGLYCERIN (NITROSTAT) 0.4 MG SL tablet Place 1 tablet (0.4 mg total) under the tongue every 5 (five) minutes x 3 doses as needed for chest pain. 09/16/14   Dyann Kief, PA-C  pantoprazole (PROTONIX) 40 MG tablet Take 1 tablet (40 mg total) 2 (two) times daily by mouth. Please make an overdue appt with Dr. Clifton James.  3rd and Final attempt 11/15/16   Kathleene Hazel, MD    Past Medical History:  Diagnosis Date  . Anxiety   . Asthma   . Chronic lower back pain   . Depression   . Family history of adverse reaction to anesthesia    "mom didn't want to wake up at all"  . GERD (gastroesophageal reflux disease)   . Hiatal hernia   . Hypertension   . NSTEMI (non-ST elevated myocardial infarction) (HCC) 06/06/2014   stent to circumfkex    Past Surgical History:  Procedure Laterality Date  . CARDIAC CATHETERIZATION N/A 06/07/2014   Procedure: Left Heart Cath and Coronary Angiography;  Surgeon: Lennette Bihari, MD;  Location: [facility] INVASIVE CV LAB;  Service: Cardiovascular;  Laterality: N/A;  . FEMUR FRACTURE SURGERY Left 2003   "metal rod runs from hip to knee"  . FRACTURE SURGERY      Social History   Tobacco Use  . Smoking status: Former Smoker    Packs/day: 1.50    Years: 30.00    Pack years: 45.00    Types: Cigarettes  . Smokeless tobacco: Never Used  . Tobacco comment: she uses vapor 6mg  no tobacco  Substance Use Topics  . Alcohol use: Yes    Alcohol/week: 0.0 oz    Comment: occasionally    Family History  Problem Relation Age of Onset  . Coronary artery disease Mother 22       MI  . Heart attack Mother   . Hypertension Mother   . Diabetes Mother   . Coronary artery disease Father 9       MI  . Heart attack Father   . Hypertension Father   . Stroke Neg Hx     Review of Systems  Constitutional: Negative for chills, fever, malaise/fatigue and weight loss.  HENT: Positive for congestion and sinus pain. Negative for ear pain and sore throat.   Respiratory: Positive for shortness of breath and wheezing. Negative for cough.   Cardiovascular: Negative for chest pain, palpitations and leg swelling.  Gastrointestinal: Positive for heartburn. Negative for abdominal pain, blood in stool, constipation, diarrhea, melena, nausea and vomiting.    OBJECTIVE:  Blood pressure  122/84, pulse (!) 115, temperature 98.5 F (36.9 C), temperature source Oral, height 5\' 7"  (1.702 m), weight 217 lb (98.4 kg), SpO2 94 %.  Peak flow reading is 350, about 79 % of predicted, yellow zone.  Physical Exam  Constitutional: She is oriented to person, place, and time and well-developed, well-nourished, and in no distress.  HENT:  Head: Normocephalic and atraumatic.  Nose: Right sinus exhibits maxillary sinus tenderness and frontal sinus tenderness. Left sinus exhibits no maxillary sinus tenderness and no frontal sinus tenderness.  Mouth/Throat: Oropharynx is clear and moist. No oropharyngeal exudate.  Eyes: EOM are normal. Pupils are equal, round, and reactive to light. No scleral icterus.  Neck: Neck supple.  Cardiovascular: Normal rate, regular rhythm and normal heart sounds. Exam reveals no gallop and no friction rub.  No murmur heard. Pulmonary/Chest: Effort normal and breath sounds normal. She has no wheezes. She has no rales.  Musculoskeletal: She exhibits no edema.  Neurological: She is alert and oriented to person, place, and time. Gait normal.  Skin: Skin is warm and dry.    ASSESSMENT and PLAN  1. Mild intermittent asthma without complication Patient with mild exacerbation and has been without duonebs for a while, reports has not needed oral steroids in the past. Refilling duonebs and restarting singulair as allergies are contributor. Consider starting ICS. ER precautions discussed.  2. Seasonal allergies Discussed starting OTC nasal steroid and adding oral antihistamine if needed for symptom control. Also discussed nasal saline washes.   3. Gastroesophageal reflux disease, esophagitis presence not specified Controlled on PPI, refilling.   4. Coronary artery disease involving native coronary artery of native heart without angina pectoris Asymptomatic, refilled plavix given stent (wondering if ok to dc > 1 year) and statin. BP ok wo meds, patient reports (ACE and  BB) were stopped by cards. She reports having a fu with cards for the 19th of this month, defer further med mgt to them.  5. Essential tremors Being controlled with bzd, discussed r/se/b. PMP reviewed and appropriate.   6. Acute non-recurrent maxillary sinusitis Treating with doxy given allergies to augmentin. Discussed mgt of allergic rhinitis.   Other orders - Check Peak Flow - alprazolam (XANAX) 2 MG tablet; Take 0.5 tablets (1 mg total) by mouth 2 (two) times daily as needed for anxiety (essential tremors). - atorvastatin (LIPITOR) 80 MG tablet; TAKE 1 TABLET BY MOUTH EVERY DAY AT 6PM - clopidogrel (PLAVIX) 75 MG tablet; Take 1 tablet (75 mg total) by mouth daily. - ipratropium-albuterol (DUONEB) 0.5-2.5 (3) MG/3ML SOLN; Take 3 mLs by nebulization every 6 (six) hours as needed (shortness of breath). - montelukast (SINGULAIR) 10 MG tablet; Take 1 tablet (10 mg total) by mouth at bedtime. - pantoprazole (PROTONIX) 40 MG tablet; Take 1 tablet (40 mg total) by mouth 2 (two) times daily. - azelastine (ASTELIN) 0.1 % nasal spray; Place 1 spray into both nostrils 2 (two) times daily. Use in each nostril as directed - doxycycline (VIBRA-TABS) 100 MG tablet; Take 1 tablet (100 mg total) by mouth 2 (two) times daily.  Return in about 4 weeks (around 03/18/2017).    Myles Lipps, MD Primary Care at [facility] [address] [place], [place] [address] Ph.  [phone] Fax [phone]

## 2017-02-21 ENCOUNTER — Ambulatory Visit: Payer: Self-pay | Admitting: Cardiovascular Disease

## 2017-02-21 NOTE — Progress Notes (Deleted)
Cardiology Office Note:    Date:  02/22/2017   ID:  Kristen Price, DOB 25-Oct-1975, MRN [id number]  PCP:  Tomi Bamberger, NP (Inactive)  Cardiologist:  Verne Carrow, MD   Referring MD: No ref. provider found   No chief complaint on file. ***  History of Present Illness:    Kristen Price is a 42 y.o. female with a hx of NSTEMI 06/2014 treated with a DES to proximal circumflex. Echo at that time with EF of 60%. She was treated with ASA and brilinta for 12 months. After 12 months, she underwent lap chole and held her brilinta. Following the surgery, she was switched to plavix.   Kristen Price was last seen in our office on 03/03/15 by [facility]. She was doing well at that time and denied exertional chest pain.   She presents today for her yearly follow up and for medication refills. She continues to do well and denies ***   1. CAD s/p NSTEMI with DES to proximal circumflex 06/07/14 Continue ASA and plavix. Echo in 2016 with normal EF and no valvular dysfunction. I have sent a message to Dr. Clifton James to review her continued need for dual antiplatelet therapy. Will refill ASA and plavix for now.    2. Current smoker She continues to smoke and is *** interested in quitting.  3. HTN Pressures are well-controlled at home. ***   4. HLD with LDL goal of less than 70 No recent lipid profile on file. On 80 mg lipitor. Will check fasting lipids and LFTs today. Will refill lipitor today.      Past Medical History:  Diagnosis Date  . Anxiety   . Asthma   . Chronic lower back pain   . Depression   . Family history of adverse reaction to anesthesia    "mom didn't want to wake up at all"  . GERD (gastroesophageal reflux disease)   . Hiatal hernia   . Hypertension   . NSTEMI (non-ST elevated myocardial infarction) (HCC) 06/06/2014    Past Surgical History:  Procedure Laterality Date  . CARDIAC CATHETERIZATION N/A 06/07/2014   Procedure: Left Heart Cath and Coronary Angiography;   Surgeon: Lennette Bihari, MD;  Location: [facility] INVASIVE CV LAB;  Service: Cardiovascular;  Laterality: N/A;  . FEMUR FRACTURE SURGERY Left 2003   "metal rod runs from hip to knee"  . FRACTURE SURGERY      Current Medications: No outpatient medications have been marked as taking for the 02/22/17 encounter (Appointment) with Marcelino Duster, PA.     Allergies:   Augmentin [amoxicillin-pot clavulanate]; Benadryl [diphenhydramine]; and Penicillins   Social History   Socioeconomic History  . Marital status: Married    Spouse name: Not on file  . Number of children: Not on file  . Years of education: Not on file  . Highest education level: Not on file  Social Needs  . Financial resource strain: Not on file  . Food insecurity - worry: Not on file  . Food insecurity - inability: Not on file  . Transportation needs - medical: Not on file  . Transportation needs - non-medical: Not on file  Occupational History  . Not on file  Tobacco Use  . Smoking status: Former Smoker    Packs/day: 0.50    Years: 25.00    Pack years: 12.50    Types: Cigarettes  . Smokeless tobacco: Never Used  . Tobacco comment: she uses vapor 6mg  no tobacco  Substance and Sexual Activity  .  Alcohol use: Yes    Alcohol/week: 0.0 oz    Comment: occasionally  . Drug use: No  . Sexual activity: Not Currently    Birth control/protection: Implant  Other Topics Concern  . Not on file  Social History Narrative  . Not on file     Family History: The patient's ***family history includes Coronary artery disease (age of onset: 6651) in her father; Coronary artery disease (age of onset: 954) in her mother; Diabetes in her mother; Heart attack in her father and mother; Hypertension in her father and mother. There is no history of Stroke.  ROS:   Please see the history of present illness.    *** All other systems reviewed and are negative.  EKGs/Labs/Other Studies Reviewed:    The following studies were reviewed  today: ***  EKG:  EKG is *** ordered today.  The ekg ordered today demonstrates ***  Recent Labs: No results found for requested labs within last 8760 hours.  Recent Lipid Panel    Component Value Date/Time   CHOL 129 09/16/2014 1208   TRIG 108.0 09/16/2014 1208   HDL 30.80 (L) 09/16/2014 1208   CHOLHDL 4 09/16/2014 1208   VLDL 21.6 09/16/2014 1208   LDLCALC 77 09/16/2014 1208    Physical Exam:    VS:  There were no vitals taken for this visit.    Wt Readings from Last 3 Encounters:  02/18/17 271 lb (122.9 kg)  03/03/15 213 lb 9.6 oz (96.9 kg)  02/23/15 207 lb (93.9 kg)     GEN: *** Well nourished, well developed in no acute distress HEENT: Normal NECK: No JVD; No carotid bruits LYMPHATICS: No lymphadenopathy CARDIAC: ***RRR, no murmurs, rubs, gallops RESPIRATORY:  Clear to auscultation without rales, wheezing or rhonchi  ABDOMEN: Soft, non-tender, non-distended MUSCULOSKELETAL:  No edema; No deformity  SKIN: Warm and dry NEUROLOGIC:  Alert and oriented x 3 PSYCHIATRIC:  Normal affect   ASSESSMENT:    No diagnosis found. PLAN:    In order of problems listed above:  No diagnosis found.   Medication Adjustments/Labs and Tests Ordered: Current medicines are reviewed at length with the patient today.  Concerns regarding medicines are outlined above.  No orders of the defined types were placed in this encounter.  No orders of the defined types were placed in this encounter.   Signed, Marcelino Dusterngela Nicole Elieser Tetrick, [place]  02/22/2017 11:25 AM    [facility]

## 2017-02-22 ENCOUNTER — Encounter: Payer: Self-pay | Admitting: Physician Assistant

## 2017-02-22 ENCOUNTER — Ambulatory Visit: Payer: Self-pay | Admitting: Physician Assistant

## 2017-02-22 NOTE — Progress Notes (Signed)
Patient was a no-show for her appt today. In consultation with Dr. Clifton JamesMcAlhany, she is OK to stop her plavix and continue with ASA. She was given enough refills to make it to today's appt. She will need to be seen before more refills are given.

## 2017-02-28 ENCOUNTER — Encounter: Payer: Self-pay | Admitting: Family Medicine

## 2017-03-05 ENCOUNTER — Encounter: Payer: Self-pay | Admitting: Physician Assistant

## 2017-03-05 ENCOUNTER — Ambulatory Visit: Payer: Self-pay | Admitting: Physician Assistant

## 2017-03-05 ENCOUNTER — Other Ambulatory Visit: Payer: Self-pay

## 2017-03-05 VITALS — BP 129/78 | HR 77 | Temp 98.1°F | Ht 67.0 in | Wt 274.0 lb

## 2017-03-05 DIAGNOSIS — M545 Low back pain: Secondary | ICD-10-CM

## 2017-03-05 DIAGNOSIS — G8929 Other chronic pain: Secondary | ICD-10-CM

## 2017-03-05 DIAGNOSIS — J101 Influenza due to other identified influenza virus with other respiratory manifestations: Secondary | ICD-10-CM

## 2017-03-05 DIAGNOSIS — R6889 Other general symptoms and signs: Secondary | ICD-10-CM

## 2017-03-05 DIAGNOSIS — J4520 Mild intermittent asthma, uncomplicated: Secondary | ICD-10-CM

## 2017-03-05 LAB — POC INFLUENZA A&B (BINAX/QUICKVUE)
Influenza A, POC: POSITIVE — AB
Influenza B, POC: NEGATIVE

## 2017-03-05 MED ORDER — GUAIFENESIN ER 1200 MG PO TB12: 1.0000 | ORAL_TABLET | Freq: Two times a day (BID) | ORAL | 1 refills | Status: DC | PRN

## 2017-03-05 MED ORDER — ALBUTEROL SULFATE HFA 108 (90 BASE) MCG/ACT IN AERS
2.0000 | INHALATION_SPRAY | RESPIRATORY_TRACT | 1 refills | Status: DC | PRN
Start: 2017-03-05 — End: 2017-05-04

## 2017-03-05 MED ORDER — IPRATROPIUM-ALBUTEROL 0.5-2.5 (3) MG/3ML IN SOLN: 3.0000 mL | RESPIRATORY_TRACT | 0 refills | Status: DC | PRN

## 2017-03-05 MED ORDER — BENZONATATE 100 MG PO CAPS: 100.0000 mg | ORAL_CAPSULE | Freq: Three times a day (TID) | ORAL | 0 refills | Status: DC | PRN

## 2017-03-05 MED ORDER — IPRATROPIUM BROMIDE 0.03 % NA SOLN: 2.0000 | Freq: Two times a day (BID) | NASAL | 0 refills | Status: DC

## 2017-03-05 MED ORDER — OSELTAMIVIR PHOSPHATE 75 MG PO CAPS: 75.0000 mg | ORAL_CAPSULE | Freq: Two times a day (BID) | ORAL | 0 refills | Status: DC

## 2017-03-05 MED ORDER — CYCLOBENZAPRINE HCL 10 MG PO TABS: 5.0000 mg | ORAL_TABLET | Freq: Three times a day (TID) | ORAL | 0 refills | Status: DC | PRN

## 2017-03-05 NOTE — Progress Notes (Signed)
Patient ID: Kristen Price, female    DOB: 1975/02/20, 42 y.o.   MRN: [id number]  PCP: Tomi Bamberger, NP (Inactive)  Chief Complaint  Patient presents with  . URI    having flu like symptoms    Subjective:   Presents for evaluation of flulike symptoms.  No seasonal flu vaccine this year. Works from home, very little exposure opportunity. Her teenage son and several of his friends came over to her home last week, several days later 5 of them were diagnosed with influenza.  Shortly thereafter, she developed flulike symptoms as well. Subjective fever/chills. Body aches. Cough. Congestion. Fatigue.  History of asthma, breathing seems harder.   Review of Systems Constitutional: Positive for fatigue. Negative for activity change (Been in bed more since symptoms started.), appetite change and fever.  HENT: Positive for congestion, rhinorrhea, sinus pressure and sinus pain. Negative for ear discharge, ear pain, hearing loss, sore throat and tinnitus.   Eyes: Positive for itching. Negative for pain and discharge (Eyes water.).  Respiratory: Positive for cough, chest tightness, shortness of breath and wheezing.   Cardiovascular: Positive for palpitations. Negative for chest pain.  Gastrointestinal: Negative.  Negative for abdominal pain, constipation and diarrhea.  Musculoskeletal: Positive for myalgias.       Body aches.  Neurological: Positive for dizziness, light-headedness and headaches (Over frontal and maxillary sinuses.).    Patient Active Problem List   Diagnosis Date Noted  . Conductive hearing loss of left ear 08/19/2015  . Gallbladder disease 03/03/2015  . Coronary artery disease involving native coronary artery of native heart without angina pectoris 06/24/2014  . Elevated troponin   . History of non-ST elevation myocardial infarction (NSTEMI) 06/06/2014  . Tobacco abuse 06/06/2014  . Chronic low back pain 10/25/2011  . GAD (generalized anxiety disorder)  09/27/2011  . Insomnia 09/27/2011     Prior to Admission medications   Medication Sig Start Date End Date Taking? Authorizing Provider  alprazolam Prudy Feeler) 2 MG tablet Take 0.5 tablets (1 mg total) by mouth 2 (two) times daily as needed for anxiety (essential tremors). 02/18/17  Yes Myles Lipps, MD  aspirin EC 81 MG EC tablet Take 1 tablet (81 mg total) by mouth daily. 06/08/14  Yes Dwana Melena, PA-C  atorvastatin (LIPITOR) 80 MG tablet TAKE 1 TABLET BY MOUTH EVERY DAY AT 6PM 02/18/17  Yes Myles Lipps, MD  azelastine (ASTELIN) 0.1 % nasal spray Place 1 spray into both nostrils 2 (two) times daily. Use in each nostril as directed 02/18/17  Yes Myles Lipps, MD  clopidogrel (PLAVIX) 75 MG tablet Take 1 tablet (75 mg total) by mouth daily. 02/18/17  Yes Myles Lipps, MD  doxycycline (VIBRA-TABS) 100 MG tablet Take 1 tablet (100 mg total) by mouth 2 (two) times daily. 02/18/17  Yes Myles Lipps, MD  etonogestrel (NEXPLANON) 68 MG IMPL implant Inject 1 each into the skin once. 06/2013   Yes [provider]  ipratropium-albuterol (DUONEB) 0.5-2.5 (3) MG/3ML SOLN Take 3 mLs by nebulization every 6 (six) hours as needed (shortness of breath). 02/18/17  Yes Myles Lipps, MD  montelukast (SINGULAIR) 10 MG tablet Take 1 tablet (10 mg total) by mouth at bedtime. 02/18/17  Yes Myles Lipps, MD  nitroGLYCERIN (NITROSTAT) 0.4 MG SL tablet Place 1 tablet (0.4 mg total) under the tongue every 5 (five) minutes x 3 doses as needed for chest pain. 09/16/14  Yes Dyann Kief, PA-C  pantoprazole (PROTONIX) 40 MG  tablet Take 1 tablet (40 mg total) by mouth 2 (two) times daily. 02/18/17  Yes Myles Lipps, MD     Allergies  Allergen Reactions  . Augmentin [Amoxicillin-Pot Clavulanate] Nausea And Vomiting  . Benadryl [Diphenhydramine] Other (See Comments)    Patient stated that it makes her "go nuts"  . Losartan Cough  . Penicillins Nausea And Vomiting       Objective:    Physical Exam  Constitutional: She is oriented to person, place, and time. She appears well-developed and well-nourished. No distress.  BP 129/78 (BP Location: Left Arm, Patient Position: Sitting, Cuff Size: Normal)   Pulse 77   Temp 98.1 F (36.7 C) (Oral)   Ht 5\' 7"  (1.702 m)   Wt 274 lb (124.3 kg)   SpO2 97%   BMI 42.91 kg/m    HENT:  Head: Normocephalic and atraumatic.  Right Ear: Hearing, tympanic membrane, external ear and ear canal normal.  Left Ear: Hearing, tympanic membrane, external ear and ear canal normal.  Nose: Mucosal edema and rhinorrhea present.  No foreign bodies. Right sinus exhibits no maxillary sinus tenderness and no frontal sinus tenderness. Left sinus exhibits no maxillary sinus tenderness and no frontal sinus tenderness.  Mouth/Throat: Uvula is midline, oropharynx is clear and moist and mucous membranes are normal. No uvula swelling. No oropharyngeal exudate.  Eyes: Conjunctivae and EOM are normal. Pupils are equal, round, and reactive to light. Right eye exhibits no discharge. Left eye exhibits no discharge. No scleral icterus.  Neck: Trachea normal, normal range of motion and full passive range of motion without pain. Neck supple. No thyroid mass and no thyromegaly present.  Cardiovascular: Normal rate, regular rhythm and normal heart sounds.  Pulmonary/Chest: Effort normal and breath sounds normal.  Lymphadenopathy:       Head (right side): No submandibular, no tonsillar, no preauricular, no posterior auricular and no occipital adenopathy present.       Head (left side): No submandibular, no tonsillar, no preauricular and no occipital adenopathy present.    She has no cervical adenopathy.       Right: No supraclavicular adenopathy present.       Left: No supraclavicular adenopathy present.  Neurological: She is alert and oriented to person, place, and time. She has normal strength. No cranial nerve deficit or sensory deficit.  Skin: Skin is warm, dry and  intact. No rash noted.  Psychiatric: She has a normal mood and affect. Her speech is normal and behavior is normal.  Quite verbose.     Results for orders placed or performed in visit on 03/05/17  POC Influenza A&B(BINAX/QUICKVUE)  Result Value Ref Range   Influenza A, POC Positive (A) Negative   Influenza B, POC Negative Negative       Assessment & Plan:   Problem List Items Addressed This Visit    Chronic low back pain    Worse with coughing due to current illness.  Refill cyclobenzaprine.      Relevant Medications   cyclobenzaprine (FLEXERIL) 10 MG tablet   Mild intermittent asthma without complication   Relevant Medications   albuterol (PROVENTIL HFA;VENTOLIN HFA) 108 (90 Base) MCG/ACT inhaler   ipratropium-albuterol (DUONEB) 0.5-2.5 (3) MG/3ML SOLN    Other Visit Diagnoses    Influenza A    -  Primary   Supportive care.  Anticipatory guidance provided.   Relevant Medications   oseltamivir (TAMIFLU) 75 MG capsule   ipratropium (ATROVENT) 0.03 % nasal spray   benzonatate (TESSALON) 100 MG  capsule   Guaifenesin (MUCINEX MAXIMUM STRENGTH) 1200 MG TB12   Flu-like symptoms       Relevant Orders   POC Influenza A&B(BINAX/QUICKVUE) (Completed)       Return if symptoms worsen or fail to improve.   Fernande Brashelle S. Dayani Winbush, PA-C Primary Care at [facility]

## 2017-03-05 NOTE — Patient Instructions (Addendum)
   IF you received an x-ray today, you will receive an invoice from [facility]. Please contact [facility] at [phone] with questions or concerns regarding your invoice.   IF you received labwork today, you will receive an invoice from [facility]. Please contact [facility] at [phone] with questions or concerns regarding your invoice.   Our billing staff will not be able to assist you with questions regarding bills from these companies.  You will be contacted with the lab results as soon as they are available. The fastest way to get your results is to activate your My Chart account. Instructions are located on the last page of this paperwork. If you have not heard from us regarding the results in 2 weeks, please contact this office.      Influenza, Adult Influenza, more commonly known as "the flu," is a viral infection that primarily affects the respiratory tract. The respiratory tract includes organs that help you breathe, such as the lungs, nose, and throat. The flu causes many common cold symptoms, as well as a high fever and body aches. The flu spreads easily from person to person (is contagious). Getting a flu shot (influenza vaccination) every year is the best way to prevent influenza. What are the causes? Influenza is caused by a virus. You can catch the virus by:  Breathing in droplets from an infected person's cough or sneeze.  Touching something that was recently contaminated with the virus and then touching your mouth, nose, or eyes.  What increases the risk? The following factors may make you more likely to get the flu:  Not cleaning your hands frequently with soap and water or alcohol-based hand sanitizer.  Having close contact with many people during cold and flu season.  Touching your mouth, eyes, or nose without washing or sanitizing your hands first.  Not drinking enough fluids or not eating a healthy diet.  Not getting enough sleep or  exercise.  Being under a high amount of stress.  Not getting a yearly (annual) flu shot.  You may be at a higher risk of complications from the flu, such as a severe lung infection (pneumonia), if you:  Are over the age of 65.  Are pregnant.  Have a weakened disease-fighting system (immune system). You may have a weakened immune system if you: ? Have HIV or AIDS. ? Are undergoing chemotherapy. ? Aretaking medicines that reduce the activity of (suppress) the immune system.  Have a long-term (chronic) illness, such as heart disease, kidney disease, diabetes, or lung disease.  Have a liver disorder.  Are obese.  Have anemia.  What are the signs or symptoms? Symptoms of this condition typically last 4-10 days and may include:  Fever.  Chills.  Headache, body aches, or muscle aches.  Sore throat.  Cough.  Runny or congested nose.  Chest discomfort and cough.  Poor appetite.  Weakness or tiredness (fatigue).  Dizziness.  Nausea or vomiting.  How is this diagnosed? This condition may be diagnosed based on your medical history and a physical exam. Your health care provider may do a nose or throat swab test to confirm the diagnosis. How is this treated? If influenza is detected early, you can be treated with antiviral medicine that can reduce the length of your illness and the severity of your symptoms. This medicine may be given by mouth (orally) or through an IV tube that is inserted in one of your veins. The goal of treatment is to relieve symptoms by taking   care of yourself at home. This may include taking over-the-counter medicines, drinking plenty of fluids, and adding humidity to the air in your home. In some cases, influenza goes away on its own. Severe influenza or complications from influenza may be treated in a hospital. Follow these instructions at home:  Take over-the-counter and prescription medicines only as told by your health care provider.  Use a  cool mist humidifier to add humidity to the air in your home. This can make breathing easier.  Rest as needed.  Drink enough fluid to keep your urine clear or pale yellow.  Cover your mouth and nose when you cough or sneeze.  Wash your hands with soap and water often, especially after you cough or sneeze. If soap and water are not available, use hand sanitizer.  Stay home from work or school as told by your health care provider. Unless you are visiting your health care provider, try to avoid leaving home until your fever has been gone for 24 hours without the use of medicine.  Keep all follow-up visits as told by your health care provider. This is important. How is this prevented?  Getting an annual flu shot is the best way to avoid getting the flu. You may get the flu shot in late summer, fall, or winter. Ask your health care provider when you should get your flu shot.  Wash your hands often or use hand sanitizer often.  Avoid contact with people who are sick during cold and flu season.  Eat a healthy diet, drink plenty of fluids, get enough sleep, and exercise regularly. Contact a health care provider if:  You develop new symptoms.  You have: ? Chest pain. ? Diarrhea. ? A fever.  Your cough gets worse.  You produce more mucus.  You feel nauseous or you vomit. Get help right away if:  You develop shortness of breath or difficulty breathing.  Your skin or nails turn a bluish color.  You have severe pain or stiffness in your neck.  You develop a sudden headache or sudden pain in your face or ear.  You cannot stop vomiting. This information is not intended to replace advice given to you by your health care provider. Make sure you discuss any questions you have with your health care provider. Document Released: 12/19/1999 Document Revised: 05/29/2015 Document Reviewed: 10/15/2014 Elsevier Interactive Patient Education  2017 Elsevier Inc.  

## 2017-03-05 NOTE — Progress Notes (Signed)
Subjective:    Patient ID: Kristen Price, female    DOB: 1975/03/26, 42 y.o.   MRN: [id number]  Chief Complaint  Patient presents with  . URI    having flu like symptoms   HPI Patient has had flu-like symptoms for the past two days. She has no energy, SOB when she moves, and feels feverish despite not having a fever in office (98.1 degrees F.) Patient stayed in bed all day yesterday and hasn't eaten much, which is unusual for her. Her symptoms are worsened by moving around. She has not tried anything to improve her symptoms. Five of her son's friends came over a few days ago prior to "getting the flu" themselves. Patient's son also has the flu. Patient is not updated on influenza vaccination.  Patient Active Problem List   Diagnosis Date Noted  . Conductive hearing loss of left ear 08/19/2015  . Gallbladder disease 03/03/2015  . Coronary artery disease involving native coronary artery of native heart without angina pectoris 06/24/2014  . Elevated troponin   . History of non-ST elevation myocardial infarction (NSTEMI) 06/06/2014  . Tobacco abuse 06/06/2014  . Chronic low back pain 10/25/2011  . GAD (generalized anxiety disorder) 09/27/2011  . Insomnia 09/27/2011   Allergies  Allergen Reactions  . Amoxicillin Nausea And Vomiting  . Augmentin [Amoxicillin-Pot Clavulanate] Nausea And Vomiting  . Benadryl [Diphenhydramine] Other (See Comments)    Patient stated that it makes her "go nuts"  . Losartan Cough  . Penicillins Nausea And Vomiting   Prior to Admission medications   Medication Sig Start Date End Date Taking? Authorizing Provider  alprazolam Prudy Feeler) 2 MG tablet Take 0.5 tablets (1 mg total) by mouth 2 (two) times daily as needed for anxiety (essential tremors). 02/18/17  Yes Myles Lipps, MD  aspirin EC 81 MG EC tablet Take 1 tablet (81 mg total) by mouth daily. 06/08/14  Yes Dwana Melena, PA-C  atorvastatin (LIPITOR) 80 MG tablet TAKE 1 TABLET BY MOUTH EVERY DAY AT 6PM  02/18/17  Yes Myles Lipps, MD  azelastine (ASTELIN) 0.1 % nasal spray Place 1 spray into both nostrils 2 (two) times daily. Use in each nostril as directed 02/18/17  Yes Myles Lipps, MD  clopidogrel (PLAVIX) 75 MG tablet Take 1 tablet (75 mg total) by mouth daily. 02/18/17  Yes Myles Lipps, MD  etonogestrel (NEXPLANON) 68 MG IMPL implant Inject 1 each into the skin once. 06/2013   Yes [provider]  ipratropium-albuterol (DUONEB) 0.5-2.5 (3) MG/3ML SOLN Take 3 mLs by nebulization every 4 (four) hours as needed (shortness of breath). 03/05/17  Yes Jeffery, Chelle, PA-C  montelukast (SINGULAIR) 10 MG tablet Take 1 tablet (10 mg total) by mouth at bedtime. 02/18/17  Yes Myles Lipps, MD  nitroGLYCERIN (NITROSTAT) 0.4 MG SL tablet Place 1 tablet (0.4 mg total) under the tongue every 5 (five) minutes x 3 doses as needed for chest pain. 09/16/14  Yes Dyann Kief, PA-C  pantoprazole (PROTONIX) 40 MG tablet Take 1 tablet (40 mg total) by mouth 2 (two) times daily. 02/18/17  Yes Myles Lipps, MD    Past Medical History:  Diagnosis Date  . Anxiety   . Asthma   . Chronic lower back pain   . Depression   . Family history of adverse reaction to anesthesia    "mom didn't want to wake up at all"  . GERD (gastroesophageal reflux disease)   . Hiatal hernia   . Hypertension   .  NSTEMI (non-ST elevated myocardial infarction) (HCC) 06/06/2014   stent to circumfkex   Social History   Socioeconomic History  . Marital status: Divorced    Spouse name: Not on file  . Number of children: 1  . Years of education: Not on file  . Highest education level: Not on file  Social Needs  . Financial resource strain: Not on file  . Food insecurity - worry: Not on file  . Food insecurity - inability: Not on file  . Transportation needs - medical: Not on file  . Transportation needs - non-medical: Not on file  Occupational History  . Occupation: Works from home for [employer]  Tobacco Use    . Smoking status: Former Smoker    Packs/day: 1.50    Years: 30.00    Pack years: 45.00    Types: Cigarettes  . Smokeless tobacco: Never Used  . Tobacco comment: she uses vapor 6mg  no tobacco  Substance and Sexual Activity  . Alcohol use: Yes    Alcohol/week: 0.0 oz    Comment: occasionally  . Drug use: No  . Sexual activity: Not Currently    Birth control/protection: Implant    Comment: Nexplanon   Other Topics Concern  . Not on file  Social History Narrative   Caffeine intake: "Mountain dew nut" can drink an 18 pack in 2-3 days   Exercise: No   Family History  Problem Relation Age of Onset  . Coronary artery disease Mother 1154       MI  . Heart attack Mother   . Hypertension Mother   . Diabetes Mother   . Coronary artery disease Father [id number]       MI  . Heart attack Father   . Hypertension Father   . Stroke Neg Hx    Past Surgical History:  Procedure Laterality Date  . CARDIAC CATHETERIZATION N/A 06/07/2014   Procedure: Left Heart Cath and Coronary Angiography;  Surgeon: Lennette Biharihomas A Kelly, MD;  [facility] IN[facility];  Service: Cardiovascular;  Laterality: N/A;  . FEMUR FRACTURE SURGERY Left 2003   "metal rod runs from hip to knee"  . FRACTURE SURGERY      Review of Systems  Constitutional: Positive for fatigue. Negative for activity change (Been in bed more since symptoms started.), appetite change and fever.  HENT: Positive for congestion, rhinorrhea, sinus pressure and sinus pain. Negative for ear discharge, ear pain, hearing loss, sore throat and tinnitus.   Eyes: Positive for itching. Negative for pain and discharge (Eyes water.).  Respiratory: Positive for cough, chest tightness, shortness of breath and wheezing.   Cardiovascular: Positive for palpitations. Negative for chest pain.  Gastrointestinal: Negative.  Negative for abdominal pain, constipation and diarrhea.  Musculoskeletal: Positive for myalgias.       Body aches.  Neurological: Positive for  dizziness, light-headedness and headaches (Over frontal and maxillary sinuses.).       Objective:   Physical Exam  Constitutional: She is oriented to person, place, and time. She appears well-developed and well-nourished.  BP 129/78 (BP Location: Left Arm, Patient Position: Sitting, Cuff Size: Normal)   Pulse 77   Temp 98.1 F (36.7 C) (Oral)   Ht 5\' 7"  (1.702 m)   Wt 274 lb (124.3 kg)   SpO2 97%   BMI 42.91 kg/m   HENT:  Head: Normocephalic and atraumatic.  Right Ear: External ear normal.  Left Ear: External ear normal.  Nose: Nose normal.  Mouth/Throat: Oropharynx is clear and moist.  Mucus noted between nasal turbinates. Oropharynx is edematous.  Eyes: Conjunctivae and EOM are normal. Pupils are equal, round, and reactive to light.  Neck: Normal range of motion. Neck supple.  Cardiovascular: Normal rate, regular rhythm, normal heart sounds and intact distal pulses.  Pulmonary/Chest: Effort normal. She has wheezes.  Musculoskeletal: Normal range of motion. She exhibits tenderness (Low back tender to palpation.).  Neurological: She is alert and oriented to person, place, and time. She has normal reflexes.  Skin: Skin is warm and dry. No erythema. No pallor.  Psychiatric: She has a normal mood and affect. Her behavior is normal. Judgment and thought content normal.    Results for orders placed or performed in visit on 03/05/17  POC Influenza A&B(BINAX/QUICKVUE)  Result Value Ref Range   Influenza A, POC Positive (A) Negative   Influenza B, POC Negative Negative      Assessment & Plan:   1. Flu-like symptoms Patient came in with congestion, body aches, extreme fatigue and shortness of breath. On PE, patient had wheezing and an erythematous oropharynx. Patient appeared to be very uncomfortable and tired. A flu test was administered.   - POC Influenza A&B(BINAX/QUICKVUE)  2. Influenza A Results of the POC influenza test showed that patient was positive for influenza A.  This is consistent with symptoms and physical exam findings. Tamiflu, Atrovent, Tessalon, and Mucinex were prescribed. Rest and drinking plenty of fluids was recommended.   - oseltamivir (TAMIFLU) 75 MG capsule; Take 1 capsule (75 mg total) by mouth 2 (two) times daily.  Dispense: 10 capsule; Refill: 0 - ipratropium (ATROVENT) 0.03 % nasal spray; Place 2 sprays into both nostrils 2 (two) times daily.  Dispense: 30 mL; Refill: 0 - benzonatate (TESSALON) 100 MG capsule; Take 1-2 capsules (100-200 mg total) by mouth 3 (three) times daily as needed for cough.  Dispense: 40 capsule; Refill: 0 - Guaifenesin (MUCINEX MAXIMUM STRENGTH) 1200 MG TB12; Take 1 tablet (1,200 mg total) by mouth every 12 (twelve) hours as needed.  Dispense: 14 tablet; Refill: 1  3. Mild intermittent asthma without complication Patient is currently struggling to manage mild intermittent asthma with albuterol alone. Ipratropium-albuterol was added to her medication regime, especially due to her increased SOB with the flu.   - albuterol (PROVENTIL HFA;VENTOLIN HFA) 108 (90 Base) MCG/ACT inhaler; Inhale 2 puffs into the lungs every 4 (four) hours as needed for wheezing or shortness of breath (cough, shortness of breath or wheezing.).  Dispense: 1 Inhaler; Refill: 1 - ipratropium-albuterol (DUONEB) 0.5-2.5 (3) MG/3ML SOLN; Take 3 mLs by nebulization every 4 (four) hours as needed (shortness of breath).  Dispense: 360 mL; Refill: 0  4. Chronic low back pain, unspecified back pain laterality, with sciatica presence unspecified Patient reports chronic low back pain that is aggravated by her sedentary lifestyle. She would like a refill of the muscle relaxant she had before, but is open to a different one as she is not sure the original one helped. Low back was tender to palpation.  - cyclobenzaprine (FLEXERIL) 10 MG tablet; Take 0.5-1 tablets (5-10 mg total) by mouth 3 (three) times daily as needed for muscle spasms.  Dispense: 30 tablet;  Refill: 0  Return if symptoms worsen or fail to improve.

## 2017-03-06 DIAGNOSIS — J4520 Mild intermittent asthma, uncomplicated: Secondary | ICD-10-CM | POA: Insufficient documentation

## 2017-03-06 NOTE — Assessment & Plan Note (Signed)
Worse with coughing due to current illness.  Refill cyclobenzaprine.

## 2017-03-23 ENCOUNTER — Ambulatory Visit (INDEPENDENT_AMBULATORY_CARE_PROVIDER_SITE_OTHER): Payer: Self-pay | Admitting: Physician Assistant

## 2017-03-23 ENCOUNTER — Ambulatory Visit (INDEPENDENT_AMBULATORY_CARE_PROVIDER_SITE_OTHER): Payer: Self-pay

## 2017-03-23 ENCOUNTER — Other Ambulatory Visit: Payer: Self-pay

## 2017-03-23 ENCOUNTER — Encounter: Payer: Self-pay | Admitting: Physician Assistant

## 2017-03-23 VITALS — BP 128/84 | HR 106 | Temp 98.1°F | Resp 16 | Ht 67.0 in | Wt 277.8 lb

## 2017-03-23 DIAGNOSIS — R0602 Shortness of breath: Secondary | ICD-10-CM

## 2017-03-23 DIAGNOSIS — J329 Chronic sinusitis, unspecified: Secondary | ICD-10-CM

## 2017-03-23 DIAGNOSIS — J4520 Mild intermittent asthma, uncomplicated: Secondary | ICD-10-CM

## 2017-03-23 DIAGNOSIS — R059 Cough, unspecified: Secondary | ICD-10-CM

## 2017-03-23 DIAGNOSIS — R05 Cough: Secondary | ICD-10-CM

## 2017-03-23 DIAGNOSIS — R062 Wheezing: Secondary | ICD-10-CM

## 2017-03-23 MED ORDER — CEFDINIR 300 MG PO CAPS: 600.0000 mg | ORAL_CAPSULE | Freq: Every day | ORAL | 0 refills | Status: DC

## 2017-03-23 MED ORDER — IPRATROPIUM BROMIDE 0.02 % IN SOLN
0.5000 mg | Freq: Once | RESPIRATORY_TRACT | Status: AC
  Administered 2017-03-23: 0.5 mg via RESPIRATORY_TRACT

## 2017-03-23 MED ORDER — IPRATROPIUM-ALBUTEROL 0.5-2.5 (3) MG/3ML IN SOLN: 3.0000 mL | RESPIRATORY_TRACT | 0 refills | Status: DC | PRN

## 2017-03-23 MED ORDER — PREDNISONE 20 MG PO TABS: ORAL_TABLET | ORAL | 0 refills | Status: DC

## 2017-03-23 MED ORDER — ALBUTEROL SULFATE (2.5 MG/3ML) 0.083% IN NEBU
2.5000 mg | INHALATION_SOLUTION | Freq: Once | RESPIRATORY_TRACT | Status: AC
Start: 2017-03-23 — End: 2017-03-23
  Administered 2017-03-23: 2.5 mg via RESPIRATORY_TRACT

## 2017-03-23 NOTE — Patient Instructions (Addendum)
It is likely less expensive to get the Nexplanon removed (CPT code 69629) and Hepatitis C screening performed at the [facility].    IF you received an x-ray today, you will receive an invoice from [facility]. Please contact [facility] at [phone] with questions or concerns regarding your invoice.   IF you received labwork today, you will receive an invoice from [facility]. Please contact [facility] at [phone] with questions or concerns regarding your invoice.   Our billing staff will not be able to assist you with questions regarding bills from these companies.  You will be contacted with the lab results as soon as they are available. The fastest way to get your results is to activate your My Chart account. Instructions are located on the last page of this paperwork. If you have not heard from [place] regarding the results in 2 weeks, please contact this office.      Sinusitis, Adult Sinusitis is soreness and inflammation of your sinuses. Sinuses are hollow spaces in the bones around your face. Your sinuses are located:  Around your eyes.  In the middle of your forehead.  Behind your nose.  In your cheekbones.  Your sinuses and nasal passages are lined with a stringy fluid (mucus). Mucus normally drains out of your sinuses. When your nasal tissues become inflamed or swollen, the mucus can become trapped or blocked so air cannot flow through your sinuses. This allows bacteria, viruses, and funguses to grow, which leads to infection. Sinusitis can develop quickly and last for 7?10 days (acute) or for more than 12 weeks (chronic). Sinusitis often develops after a cold. What are the causes? This condition is caused by anything that creates swelling in the sinuses or stops mucus from draining, including:  Allergies.  Asthma.  Bacterial or viral infection.  Abnormally shaped bones between the nasal passages.  Nasal growths that contain mucus  (nasal polyps).  Narrow sinus openings.  Pollutants, such as chemicals or irritants in the air.  A foreign object stuck in the nose.  A fungal infection. This is rare.  What increases the risk? The following factors may make you more likely to develop this condition:  Having allergies or asthma.  Having had a recent cold or respiratory tract infection.  Having structural deformities or blockages in your nose or sinuses.  Having a weak immune system.  Doing a lot of swimming or diving.  Overusing nasal sprays.  Smoking.  What are the signs or symptoms? The main symptoms of this condition are pain and a feeling of pressure around the affected sinuses. Other symptoms include:  Upper toothache.  Earache.  Headache.  Bad breath.  Decreased sense of smell and taste.  A cough that may get worse at night.  Fatigue.  Fever.  Thick drainage from your nose. The drainage is often [place] and it may contain pus (purulent).  Stuffy nose or congestion.  Postnasal drip. This is when extra mucus collects in the throat or back of the nose.  Swelling and warmth over the affected sinuses.  Sore throat.  Sensitivity to light.  How is this diagnosed? This condition is diagnosed based on symptoms, a medical history, and a physical exam. To find out if your condition is acute or chronic, your health care provider may:  Look in your nose for signs of nasal polyps.  Tap over the affected sinus to check for signs of infection.  View the inside of your sinuses using an imaging device that has  a light attached (endoscope).  If your health care provider suspects that you have chronic sinusitis, you may also:  Be tested for allergies.  Have a sample of mucus taken from your nose (nasal culture) and checked for bacteria.  Have a mucus sample examined to see if your sinusitis is related to an allergy.  If your sinusitis does not respond to treatment and it lasts longer than 8  weeks, you may have an MRI or CT scan to check your sinuses. These scans also help to determine how severe your infection is. In rare cases, a bone biopsy may be done to rule out more serious types of fungal sinus disease. How is this treated? Treatment for sinusitis depends on the cause and whether your condition is chronic or acute. If a virus is causing your sinusitis, your symptoms will go away on their own within 10 days. You may be given medicines to relieve your symptoms, including:  Topical nasal decongestants. They shrink swollen nasal passages and let mucus drain from your sinuses.  Antihistamines. These drugs block inflammation that is triggered by allergies. This can help to ease swelling in your nose and sinuses.  Topical nasal corticosteroids. These are nasal sprays that ease inflammation and swelling in your nose and sinuses.  Nasal saline washes. These rinses can help to get rid of thick mucus in your nose.  If your condition is caused by bacteria, you will be given an antibiotic medicine. If your condition is caused by a fungus, you will be given an antifungal medicine. Surgery may be needed to correct underlying conditions, such as narrow nasal passages. Surgery may also be needed to remove polyps. Follow these instructions at home: Medicines  Take, use, or apply over-the-counter and prescription medicines only as told by your health care provider. These may include nasal sprays.  If you were prescribed an antibiotic medicine, take it as told by your health care provider. Do not stop taking the antibiotic even if you start to feel better. Hydrate and Humidify  Drink enough water to keep your urine clear or pale yellow. Staying hydrated will help to thin your mucus.  Use a cool mist humidifier to keep the humidity level in your home above 50%.  Inhale steam for 10-15 minutes, 3-4 times a day or as told by your health care provider. You can do this in the bathroom while a  hot shower is running.  Limit your exposure to cool or dry air. Rest  Rest as much as possible.  Sleep with your head raised (elevated).  Make sure to get enough sleep each night. General instructions  Apply a warm, moist washcloth to your face 3-4 times a day or as told by your health care provider. This will help with discomfort.  Wash your hands often with soap and water to reduce your exposure to viruses and other germs. If soap and water are not available, use hand sanitizer.  Do not smoke. Avoid being around people who are smoking (secondhand smoke).  Keep all follow-up visits as told by your health care provider. This is important. Contact a health care provider if:  You have a fever.  Your symptoms get worse.  Your symptoms do not improve within 10 days. Get help right away if:  You have a severe headache.  You have persistent vomiting.  You have pain or swelling around your face or eyes.  You have vision problems.  You develop confusion.  Your neck is stiff.  You have  trouble breathing. This information is not intended to replace advice given to you by your health care provider. Make sure you discuss any questions you have with your health care provider. Document Released: 12/21/2004 Document Revised: 08/17/2015 Document Reviewed: 10/16/2014 Elsevier Interactive Patient Education  [employer]2018 Else[employer] Inc.

## 2017-03-23 NOTE — Progress Notes (Signed)
Patient ID: Kristen Price, female    DOB: 1975-01-29, 42 y.o.   MRN: [id number]  PCP: Myles Lipps, MD  Chief Complaint  Patient presents with  . URI    not feeling any better, bloody/brown mucus , hoarseness   . Cough    nettie pot and nebs not helping     Subjective:   Presents for evaluation of persistent illness.  Patient was seen here on 03/05/2017 with respiratory symptoms and found to have influenza A. She was treated with Tamiflu, but 2 days after completing treatment she developed worsening symptoms.  Cough, SOB, chest soreness with coughing. Q 2 hours home nebs are helpful only briefly. Using neti pot.  Recall that she has a 30-pack year history and uses an e-cigarette now.  Other issues: -found out a previous partner has Hep C. They dated in 2016. Found out from a mutual friend. -desires Nexplanon removal   Review of Systems Constitutional: Positive for activity change and fatigue. Negative for appetite change, chills, diaphoresis, fever and unexpected weight change.  HENT: Positive for congestion, rhinorrhea, sinus pressure and voice change. Negative for drooling, ear discharge, ear pain, sinus pain, sore throat and trouble swallowing.   Eyes: Positive for itching. Negative for photophobia and discharge.  Respiratory: Positive for cough, chest tightness, shortness of breath and wheezing. Negative for choking and stridor.   Cardiovascular: Positive for chest pain.  Gastrointestinal: Negative.  Negative for abdominal pain, diarrhea, nausea and vomiting.  Genitourinary: Negative.  Negative for difficulty urinating, dysuria and frequency.       Stress incontinence with cough.  Musculoskeletal: Negative.  Negative for arthralgias, gait problem and myalgias.  Neurological: Positive for dizziness, weakness and headaches. Negative for syncope and light-headedness.       Patient Active Problem List   Diagnosis Date Noted  . Mild intermittent asthma without  complication 03/06/2017  . Conductive hearing loss of left ear 08/19/2015  . Gallbladder disease 03/03/2015  . Coronary artery disease involving native coronary artery of native heart without angina pectoris 06/24/2014  . Elevated troponin   . History of non-ST elevation myocardial infarction (NSTEMI) 06/06/2014  . Tobacco abuse 06/06/2014  . Chronic low back pain 10/25/2011  . GAD (generalized anxiety disorder) 09/27/2011  . Insomnia 09/27/2011     Prior to Admission medications   Medication Sig Start Date End Date Taking? Authorizing Provider  albuterol (PROVENTIL HFA;VENTOLIN HFA) 108 (90 Base) MCG/ACT inhaler Inhale 2 puffs into the lungs every 4 (four) hours as needed for wheezing or shortness of breath (cough, shortness of breath or wheezing.). 03/05/17  Yes Kandas Oliveto, PA-C  alprazolam (XANAX) 2 MG tablet Take 0.5 tablets (1 mg total) by mouth 2 (two) times daily as needed for anxiety (essential tremors). 02/18/17  Yes Myles Lipps, MD  aspirin EC 81 MG EC tablet Take 1 tablet (81 mg total) by mouth daily. 06/08/14  Yes Dwana Melena, PA-C  benzonatate (TESSALON) 100 MG capsule Take 1-2 capsules (100-200 mg total) by mouth 3 (three) times daily as needed for cough. 03/05/17  Yes Skylan Lara, PA-C  clopidogrel (PLAVIX) 75 MG tablet Take 1 tablet (75 mg total) by mouth daily. 02/18/17  Yes Myles Lipps, MD  cyclobenzaprine (FLEXERIL) 10 MG tablet Take 0.5-1 tablets (5-10 mg total) by mouth 3 (three) times daily as needed for muscle spasms. 03/05/17  Yes Milas Schappell, PA-C  etonogestrel (NEXPLANON) 68 MG IMPL implant Inject 1 each into the skin once. 06/2013  Yes [provider]  ipratropium-albuterol (DUONEB) 0.5-2.5 (3) MG/3ML SOLN Take 3 mLs by nebulization every 4 (four) hours as needed (shortness of breath). 03/05/17  Yes Anina Schnake, PA-C  montelukast (SINGULAIR) 10 MG tablet Take 1 tablet (10 mg total) by mouth at bedtime. 02/18/17  Yes Myles LippsSantiago, Irma M, MD    nitroGLYCERIN (NITROSTAT) 0.4 MG SL tablet Place 1 tablet (0.4 mg total) under the tongue every 5 (five) minutes x 3 doses as needed for chest pain. 09/16/14  Yes Dyann KiefLenze, Michele M, PA-C  pantoprazole (PROTONIX) 40 MG tablet Take 1 tablet (40 mg total) by mouth 2 (two) times daily. 02/18/17  Yes Myles LippsSantiago, Irma M, MD  atorvastatin (LIPITOR) 80 MG tablet TAKE 1 TABLET BY MOUTH EVERY DAY AT 6PM Patient not taking: Reported on 03/23/2017 02/18/17   Myles LippsSantiago, Irma M, MD  azelastine (ASTELIN) 0.1 % nasal spray Place 1 spray into both nostrils 2 (two) times daily. Use in each nostril as directed 02/18/17   Myles LippsSantiago, Irma M, MD  Guaifenesin [facility](MUCINEX MAXIMUM STRENGTH) 1200 MG TB12 Take 1 tablet (1,200 mg total) by mouth every 12 (twelve) hours as needed. Patient not taking: Reported on 03/23/2017 03/05/17   Porfirio OarJeffery, Lavontay Kirk, PA-C  oseltamivir (TAMIFLU) 75 MG capsule Take 1 capsule (75 mg total) by mouth 2 (two) times daily. Patient not taking: Reported on 03/23/2017 03/05/17   Porfirio OarJeffery, Rayden Dock, PA-C     Allergies  Allergen Reactions  . Amoxicillin Nausea And Vomiting  . Augmentin [Amoxicillin-Pot Clavulanate] Nausea And Vomiting  . Benadryl [Diphenhydramine] Other (See Comments)    Patient stated that it makes her "go nuts"  . Losartan Cough  . Penicillins Nausea And Vomiting       Objective:  Physical Exam  Constitutional: She is oriented to person, place, and time. She appears well-developed and well-nourished. No distress.  BP 128/84   Pulse (!) 106   Temp 98.1 F (36.7 C)   Resp 16   Ht 5\' 7"  (1.702 m)   Wt 277 lb 12.8 oz (126 kg)   SpO2 96%   BMI 43.51 kg/m    HENT:  Head: Normocephalic and atraumatic.  Right Ear: Hearing, tympanic membrane, external ear and ear canal normal.  Left Ear: Hearing, tympanic membrane, external ear and ear canal normal.  Nose: Mucosal edema and rhinorrhea present.  No foreign bodies. Right sinus exhibits no maxillary sinus tenderness and no frontal sinus  tenderness. Left sinus exhibits no maxillary sinus tenderness and no frontal sinus tenderness.  Mouth/Throat: Uvula is midline, oropharynx is clear and moist and mucous membranes are normal. No uvula swelling. No oropharyngeal exudate.  Eyes: Pupils are equal, round, and reactive to light. Conjunctivae and EOM are normal. Right eye exhibits no discharge. Left eye exhibits no discharge. No scleral icterus.  Neck: Trachea normal, normal range of motion and full passive range of motion without pain. Neck supple. No thyroid mass and no thyromegaly present.  Cardiovascular: Normal rate, regular rhythm and normal heart sounds.  Pulmonary/Chest: Effort normal. No respiratory distress. She has wheezes. She has no rales. She exhibits no tenderness.  Lymphadenopathy:       Head (right side): No submandibular, no tonsillar, no preauricular, no posterior auricular and no occipital adenopathy present.       Head (left side): No submandibular, no tonsillar, no preauricular and no occipital adenopathy present.    She has no cervical adenopathy.       Right: No supraclavicular adenopathy present.  Left: No supraclavicular adenopathy present.  Neurological: She is alert and oriented to person, place, and time. She has normal strength. No cranial nerve deficit or sensory deficit.  Skin: Skin is warm, dry and intact. No rash noted.  Psychiatric: She has a normal mood and affect. Her speech is normal and behavior is normal.       Dg Chest 2 View  Result Date: 03/23/2017 CLINICAL DATA:  Persistent cough following influenza, shortness of breath, wheezing which improves with nebulizer EXAM: CHEST - 2 VIEW COMPARISON:  None FINDINGS: Upper normal heart size. Pulmonary vascularity normal. Density at the RIGHT cardiophrenic angle is nonspecific, could represent a prominent epicardial fat pad though other etiologies are not excluded. Lungs clear. No acute infiltrate, pleural effusion or pneumothorax. Bones  unremarkable. IMPRESSION: No acute infiltrates. Prominent density at the RIGHT cardiophrenic angle could represent a prominent epicardial fat pad though mass lesion is not excluded; if patient has prior outside chest radiographs or CT chest/abdomen exams, these would be of benefit in establishing stability of this finding. In the absence of prior exams, noncontrast CT recommended to exclude mass lesion. Electronically Signed   By: Ulyses Southward M.D.   On: 03/23/2017 18:23   Albuterol + Atrovent neb in the office with subjective improvement in symptoms and reduction in wheezing on exam.    Assessment & Plan:   1. Shortness of breath 2. Cough 3. Wheezing 4. Mild intermittent asthma without complication 5. Chronic sinusitis, unspecified location 6. Mild intermittent asthma without complication Reassuring CXR. Suspect sinusitis secondary to recent influenza. Continue her routine medications for asthma, including montelukast, Duoneb. Continue PRN tessalon perles. Add oral prednisone taper. - albuterol (PROVENTIL) (2.5 MG/3ML) 0.083% nebulizer solution 2.5 mg - ipratropium (ATROVENT) nebulizer solution 0.5 mg - DG Chest 2 View; Future - predniSONE (DELTASONE) 20 MG tablet; Take 3 PO QAM x3days, 2 PO QAM x3days, 1 PO QAM x3days  Dispense: 18 tablet; Refill: 0 - ipratropium-albuterol (DUONEB) 0.5-2.5 (3) MG/3ML SOLN; Take 3 mLs by nebulization every 4 (four) hours as needed (shortness of breath).  Dispense: 360 mL; Refill: 0 - cefdinir (OMNICEF) 300 MG capsule; Take 2 capsules (600 mg total) by mouth daily.  Dispense: 20 capsule; Refill: 0    Return if symptoms worsen or fail to improve; per convenience for Nexplanon removal and Hep C screening.   Fernande Bras, PA-C [facility]

## 2017-03-23 NOTE — Progress Notes (Signed)
Subjective:    Patient ID: Kristen Price, female    DOB: 05/05/1975, 42 y.o.   MRN: [id number]  Chief Complaint  Patient presents with  . URI    not feeling any better, bloody/brown mucus , hoarseness   . Cough    nettie pot and nebs not helping    HPI Patient presents with congestion, cough, and shortness of breathe. Patient has rhinorrhea with blood-tinged mucus, and non productive cough. Patient feels extremely weak and fatigued. Patient was seen for influenza A on 03/05/2017 and was treated with Tamiflu. After patient finished the Tamiflu, she noticed a slight improvement for two days. Following this slight improvement, patient states that a cough 'began.' Cough was reported on her visit at 03/05/2017, but patient states that "this cough began after that." Patient has "extreme" shortness of breath, dizziness, wheezing and overall difficulty breathing. Patient has chest pain that is worst cough coughing fits.   Patient is using Duoneb every 2 hours and nettie pot daily for her congestion. Patient states that these interventions help for a brief moment, but not enough that she can go back to work. Patient states that cough is coming from her chest. Patient's oxygen is 96% on room air.  PMH: Patient has a 30 pack year history and smokes e-cigs daily. Patient is self-pay.   Patient dated a guy in 2016 that has Hepatitis C. Patient is an IV drug user. Patient is concerned about this after finding this out from a common friend. They only had sex, did not share IV drug paraphernalia.    Patient Active Problem List   Diagnosis Date Noted  . Mild intermittent asthma without complication 03/06/2017  . Conductive hearing loss of left ear 08/19/2015  . Gallbladder disease 03/03/2015  . Coronary artery disease involving native coronary artery of native heart without angina pectoris 06/24/2014  . Elevated troponin   . History of non-ST elevation myocardial infarction (NSTEMI) 06/06/2014  . Tobacco  abuse 06/06/2014  . Chronic low back pain 10/25/2011  . GAD (generalized anxiety disorder) 09/27/2011  . Insomnia 09/27/2011   Allergies  Allergen Reactions  . Amoxicillin Nausea And Vomiting  . Augmentin [Amoxicillin-Pot Clavulanate] Nausea And Vomiting  . Benadryl [Diphenhydramine] Other (See Comments)    Patient stated that it makes her "go nuts"  . Losartan Cough  . Penicillins Nausea And Vomiting   Prior to Admission medications   Medication Sig Start Date End Date Taking? Authorizing Provider  albuterol (PROVENTIL HFA;VENTOLIN HFA) 108 (90 Base) MCG/ACT inhaler Inhale 2 puffs into the lungs every 4 (four) hours as needed for wheezing or shortness of breath (cough, shortness of breath or wheezing.). 03/05/17  Yes Jeffery, Chelle, PA-C  alprazolam (XANAX) 2 MG tablet Take 0.5 tablets (1 mg total) by mouth 2 (two) times daily as needed for anxiety (essential tremors). 02/18/17  Yes Myles Lipps, MD  aspirin EC 81 MG EC tablet Take 1 tablet (81 mg total) by mouth daily. 06/08/14  Yes Dwana Melena, PA-C  benzonatate (TESSALON) 100 MG capsule Take 1-2 capsules (100-200 mg total) by mouth 3 (three) times daily as needed for cough. 03/05/17  Yes Jeffery, Chelle, PA-C  clopidogrel (PLAVIX) 75 MG tablet Take 1 tablet (75 mg total) by mouth daily. 02/18/17  Yes Myles Lipps, MD  cyclobenzaprine (FLEXERIL) 10 MG tablet Take 0.5-1 tablets (5-10 mg total) by mouth 3 (three) times daily as needed for muscle spasms. 03/05/17  Yes Jeffery, Chelle, PA-C  etonogestrel (NEXPLANON) 68 MG  IMPL implant Inject 1 each into the skin once. 06/2013   Yes [provider]  ipratropium-albuterol (DUONEB) 0.5-2.5 (3) MG/3ML SOLN Take 3 mLs by nebulization every 4 (four) hours as needed (shortness of breath). 03/05/17  Yes Jeffery, Chelle, PA-C  montelukast (SINGULAIR) 10 MG tablet Take 1 tablet (10 mg total) by mouth at bedtime. 02/18/17  Yes Myles LippsSantiago, Irma M, MD  nitroGLYCERIN (NITROSTAT) 0.4 MG SL tablet  Place 1 tablet (0.4 mg total) under the tongue every 5 (five) minutes x 3 doses as needed for chest pain. 09/16/14  Yes Dyann KiefLenze, Michele M, PA-C  pantoprazole (PROTONIX) 40 MG tablet Take 1 tablet (40 mg total) by mouth 2 (two) times daily. 02/18/17  Yes Myles LippsSantiago, Irma M, MD   Past Medical History:  Diagnosis Date  . Anxiety   . Asthma   . Chronic lower back pain   . Depression   . Family history of adverse reaction to anesthesia    "mom didn't want to wake up at all"  . GERD (gastroesophageal reflux disease)   . Hiatal hernia   . Hypertension   . NSTEMI (non-ST elevated myocardial infarction) (HCC) 06/06/2014   stent to circumfkex   Social History   Socioeconomic History  . Marital status: Divorced    Spouse name: Not on file  . Number of children: 1  . Years of education: Not on file  . Highest education level: Not on file  Social Needs  . Financial resource strain: Not on file  . Food insecurity - worry: Not on file  . Food insecurity - inability: Not on file  . Transportation needs - medical: Not on file  . Transportation needs - non-medical: Not on file  Occupational History  . Occupation: Works from home for [employer]  Tobacco Use  . Smoking status: Former Smoker    Packs/day: 1.50    Years: 30.00    Pack years: 45.00    Types: Cigarettes  . Smokeless tobacco: Never Used  . Tobacco comment: she uses vapor 6mg  no tobacco  Substance and Sexual Activity  . Alcohol use: Yes    Alcohol/week: 0.0 oz    Comment: occasionally  . Drug use: No  . Sexual activity: Not Currently    Birth control/protection: Implant    Comment: Nexplanon   Other Topics Concern  . Not on file  Social History Narrative   Caffeine intake: "Mountain dew nut" can drink an 18 pack in 2-3 days   Exercise: No   Family History  Problem Relation Age of Onset  . Coronary artery disease Mother [id number]       MI  . Heart attack Mother   . Hypertension Mother   . Diabetes Mother   . Coronary artery disease  Father [id number]       MI  . Heart attack Father   . Hypertension Father   . Stroke Neg Hx    Past Surgical History:  Procedure Laterality Date  . CARDIAC CATHETERIZATION N/A 06/07/2014   Procedure: Left Heart Cath and Coronary Angiography;  Surgeon: Lennette Biharihomas A Kelly, MD;  [facility];  Service: Cardiovascular;  Laterality: N/A;  . FEMUR FRACTURE SURGERY Left 2003   "metal rod runs from hip to knee"  . FRACTURE SURGERY      Review of Systems  Constitutional: Positive for activity change and fatigue. Negative for appetite change, chills, diaphoresis, fever and unexpected weight change.  HENT: Positive for congestion, rhinorrhea, sinus pressure and voice change. Negative  for drooling, ear discharge, ear pain, sinus pain, sore throat and trouble swallowing.   Eyes: Positive for itching. Negative for photophobia and discharge.  Respiratory: Positive for cough, chest tightness, shortness of breath and wheezing. Negative for choking and stridor.   Cardiovascular: Positive for chest pain.  Gastrointestinal: Negative.  Negative for abdominal pain, diarrhea, nausea and vomiting.  Genitourinary: Negative.  Negative for difficulty urinating, dysuria and frequency.       Stress incontinence with cough.  Musculoskeletal: Negative.  Negative for arthralgias, gait problem and myalgias.  Neurological: Positive for dizziness, weakness and headaches. Negative for syncope and light-headedness.       Objective:   Physical Exam  Constitutional: She is oriented to person, place, and time. She appears well-developed and well-nourished.  BP 128/84   Pulse (!) 106   Temp 98.1 F (36.7 C)   Resp 16   Ht 5\' 7"  (1.702 m)   Wt 277 lb 12.8 oz (126 kg)   SpO2 96%   BMI 43.51 kg/m   HENT:  Head: Normocephalic and atraumatic.  Right Ear: External ear normal.  Left Ear: External ear normal.  Nose: Nose normal.  Mouth/Throat: Oropharynx is clear and moist.  Oropharynx slightly erythematous. Mucus  noted between nasal turbinates. Sinus pressure could not be replicated on physical exam.  Eyes: Conjunctivae and EOM are normal. Pupils are equal, round, and reactive to light.  Neck: Normal range of motion. Neck supple.  Cardiovascular: Normal rate, regular rhythm, normal heart sounds and intact distal pulses. Exam reveals no gallop and no friction rub.  No murmur heard. Pulmonary/Chest: She has wheezes.  Breathing labored and expiratory wheezes heard throughout.   Neurological: She is alert and oriented to person, place, and time. She has normal reflexes.  Skin: Skin is warm and dry. No rash noted. No erythema. No pallor.  Psychiatric: Her behavior is normal. Judgment and thought content normal.   Dg Chest 2 View  Result Date: 03/23/2017 CLINICAL DATA:  Persistent cough following influenza, shortness of breath, wheezing which improves with nebulizer EXAM: CHEST - 2 VIEW COMPARISON:  None FINDINGS: Upper normal heart size. Pulmonary vascularity normal. Density at the RIGHT cardiophrenic angle is nonspecific, could represent a prominent epicardial fat pad though other etiologies are not excluded. Lungs clear. No acute infiltrate, pleural effusion or pneumothorax. Bones unremarkable. IMPRESSION: No acute infiltrates. Prominent density at the RIGHT cardiophrenic angle could represent a prominent epicardial fat pad though mass lesion is not excluded; if patient has prior outside chest radiographs or CT chest/abdomen exams, these would be of benefit in establishing stability of this finding. In the absence of prior exams, noncontrast CT recommended to exclude mass lesion. Electronically Signed   By: Ulyses Southward M.D.   On: 03/23/2017 18:23    Assessment & Plan:   1. Shortness of breath Duoneb administered in office for SOB and wheezing noted on physical exam. Oral prednisone prescribed.  - albuterol (PROVENTIL) (2.5 MG/3ML) 0.083% nebulizer solution 2.5 mg - ipratropium (ATROVENT) nebulizer solution  0.5 mg  2. Cough Patient's cough is non-productive. It has been present since 03/05/2017. At that time patient was given Tessalon for it. She is still taking Tessalon. Cough persists and causes shortness of breath and chest pain. Chest x-ray showed no acute infiltrate, pleural effusion, or pneomothorax.  3. Wheezing Duoneb administered in office for SOB and wheezing noted on physical exam. Oral prednisone prescribed.  4. Mild intermittent asthma without complication Currently managed with Montelukast, Duoneb, and Albuterol inhaler. Chest  x-ray showed no acute infiltrate, pleural effusion, or pneomothorax. Oral prednisone prescribed.  5. Chronic sinusitis, unspecified location Cefdinir was prescribed for bacterial sinus infection.   Follow up if symptoms worsen or fail to improve.

## 2017-03-23 NOTE — Progress Notes (Deleted)
Patient ID: Kristen Price, female    DOB: 1975-06-06, 42 y.o.   MRN: [id number]  PCP: Myles LippsSantiago, Irma M, MD  Chief Complaint  Patient presents with  . URI    not feeling any better, bloody/brown mucus , hoarseness   . Cough    nettie pot and nebs not helping     Subjective:   Presents for evaluation of ***.  ***.    Review of Systems     Patient Active Problem List   Diagnosis Date Noted  . Mild intermittent asthma without complication 03/06/2017  . Conductive hearing loss of left ear 08/19/2015  . Gallbladder disease 03/03/2015  . Coronary artery disease involving native coronary artery of native heart without angina pectoris 06/24/2014  . Elevated troponin   . History of non-ST elevation myocardial infarction (NSTEMI) 06/06/2014  . Tobacco abuse 06/06/2014  . Chronic low back pain 10/25/2011  . GAD (generalized anxiety disorder) 09/27/2011  . Insomnia 09/27/2011     Prior to Admission medications   Medication Sig Start Date End Date Taking? Authorizing Provider  albuterol (PROVENTIL HFA;VENTOLIN HFA) 108 (90 Base) MCG/ACT inhaler Inhale 2 puffs into the lungs every 4 (four) hours as needed for wheezing or shortness of breath (cough, shortness of breath or wheezing.). 03/05/17   Porfirio OarJeffery, Chelle, PA-C  alprazolam Prudy Feeler(XANAX) 2 MG tablet Take 0.5 tablets (1 mg total) by mouth 2 (two) times daily as needed for anxiety (essential tremors). 02/18/17   Myles LippsSantiago, Irma M, MD  aspirin EC 81 MG EC tablet Take 1 tablet (81 mg total) by mouth daily. 06/08/14   Dwana MelenaHager, Bryan W, PA-C  atorvastatin (LIPITOR) 80 MG tablet TAKE 1 TABLET BY MOUTH EVERY DAY AT 6PM 02/18/17   Myles LippsSantiago, Irma M, MD  azelastine (ASTELIN) 0.1 % nasal spray Place 1 spray into both nostrils 2 (two) times daily. Use in each nostril as directed 02/18/17   Myles LippsSantiago, Irma M, MD  benzonatate (TESSALON) 100 MG capsule Take 1-2 capsules (100-200 mg total) by mouth 3 (three) times daily as needed for cough. 03/05/17   Porfirio OarJeffery,  Chelle, PA-C  clopidogrel (PLAVIX) 75 MG tablet Take 1 tablet (75 mg total) by mouth daily. 02/18/17   Myles LippsSantiago, Irma M, MD  cyclobenzaprine (FLEXERIL) 10 MG tablet Take 0.5-1 tablets (5-10 mg total) by mouth 3 (three) times daily as needed for muscle spasms. 03/05/17   Porfirio OarJeffery, Chelle, PA-C  etonogestrel (NEXPLANON) 68 MG IMPL implant Inject 1 each into the skin once. 06/2013    [provider]  Guaifenesin (MUCINEX MAXIMUM STRENGTH) 1200 MG TB12 Take 1 tablet (1,200 mg total) by mouth every 12 (twelve) hours as needed. 03/05/17   Jeffery, Chelle, PA-C  ipratropium (ATROVENT) 0.03 % nasal spray Place 2 sprays into both nostrils 2 (two) times daily. 03/05/17   Jeffery, Chelle, PA-C  ipratropium-albuterol (DUONEB) 0.5-2.5 (3) MG/3ML SOLN Take 3 mLs by nebulization every 4 (four) hours as needed (shortness of breath). 03/05/17   Jeffery, Chelle, PA-C  montelukast (SINGULAIR) 10 MG tablet Take 1 tablet (10 mg total) by mouth at bedtime. 02/18/17   Myles LippsSantiago, Irma M, MD  nitroGLYCERIN (NITROSTAT) 0.4 MG SL tablet Place 1 tablet (0.4 mg total) under the tongue every 5 (five) minutes x 3 doses as needed for chest pain. 09/16/14   Dyann KiefLenze, Michele M, PA-C  oseltamivir (TAMIFLU) 75 MG capsule Take 1 capsule (75 mg total) by mouth 2 (two) times daily. 03/05/17   Porfirio OarJeffery, Chelle, PA-C  pantoprazole (PROTONIX) 40 MG tablet  Take 1 tablet (40 mg total) by mouth 2 (two) times daily. 02/18/17   Myles Lipps, MD     Allergies  Allergen Reactions  . Amoxicillin Nausea And Vomiting  . Augmentin [Amoxicillin-Pot Clavulanate] Nausea And Vomiting  . Benadryl [Diphenhydramine] Other (See Comments)    Patient stated that it makes her "go nuts"  . Losartan Cough  . Penicillins Nausea And Vomiting       Objective:  Physical Exam         Assessment & Plan:   ***

## 2017-03-25 ENCOUNTER — Telehealth: Payer: Self-pay | Admitting: Family Medicine

## 2017-03-25 NOTE — Telephone Encounter (Signed)
Copied from CRM [id number]. Topic: Quick Communication - See Telephone Encounter >> Mar 25, 2017  2:14 PM Rudi CocoLathan, Kazuma Elena M, NT wrote: CRM for notification. See Telephone encounter for: 03/25/17.  Pt. Calling back to get the extending results from x ray pt. Is worried and wanting someone to give her a cb# [phone]

## 2017-03-31 ENCOUNTER — Other Ambulatory Visit: Payer: Self-pay | Admitting: Cardiovascular Disease

## 2017-05-04 ENCOUNTER — Other Ambulatory Visit: Payer: Self-pay | Admitting: *Deleted

## 2017-05-04 ENCOUNTER — Other Ambulatory Visit: Payer: Self-pay | Admitting: Family Medicine

## 2017-05-04 DIAGNOSIS — J4520 Mild intermittent asthma, uncomplicated: Secondary | ICD-10-CM

## 2017-05-04 MED ORDER — ALBUTEROL SULFATE HFA 108 (90 BASE) MCG/ACT IN AERS: 2.0000 | INHALATION_SPRAY | RESPIRATORY_TRACT | 0 refills | Status: DC | PRN

## 2017-05-04 MED ORDER — IPRATROPIUM-ALBUTEROL 0.5-2.5 (3) MG/3ML IN SOLN: 3.0000 mL | RESPIRATORY_TRACT | 0 refills | Status: DC | PRN

## 2017-05-04 MED ORDER — MONTELUKAST SODIUM 10 MG PO TABS: 10.0000 mg | ORAL_TABLET | Freq: Every day | ORAL | 0 refills | Status: DC

## 2017-05-04 MED ORDER — PANTOPRAZOLE SODIUM 40 MG PO TBEC: 40.0000 mg | DELAYED_RELEASE_TABLET | Freq: Two times a day (BID) | ORAL | 0 refills | Status: DC

## 2017-05-04 NOTE — Telephone Encounter (Signed)
Copied from CRM [id number]. Topic: Quick Communication - Rx Refill/Question >> May 04, 2017 10:14 AM Floria Raveling A wrote: Medication: pantoprazole (PROTONIX) 40 MG tablet [[id number] alprazolam (XANAX) 2 MG tablet [[id number]] montelukast (SINGULAIR) 10 MG tablet [[id number] albuterol (PROVENTIL HFA;VENTOLIN HFA) 108 (90 Base) MCG/ACT inhaler [[id number]]  ipratropium-albuterol (DUONEB) 0.5-2.5 (3) MG/3ML SOLN [[id number]] clopidogrel (PLAVIX) 75 MG tablet [[id number]]   Has the patient contacted their pharmacy?  No  (Agent: If no, request that the patient contact the pharmacy for the refill.) Preferred Pharmacy (with phone number or street name): [facility] on filed - verified  Agent: Please be advised that RX refills may take up to 3 business days. We ask that you follow-up with your pharmacy.

## 2017-05-04 NOTE — Telephone Encounter (Signed)
Rx refilled per protocol for 1 month with note- patient needs appointment: Protonix, Singular, Proventil,Duoned Per cardiologist note: Plavix discontinued- patient to take ASA  Rx refill request : Xanax 2 mg     last filled: 02/18/17 # 30  LOV: 03/23/17  PCP: Leretha Pol  Pharmacy: verified

## 2017-05-11 MED ORDER — ALPRAZOLAM 2 MG PO TABS: 1.0000 mg | ORAL_TABLET | Freq: Two times a day (BID) | ORAL | 2 refills | Status: DC | PRN

## 2017-05-11 NOTE — Telephone Encounter (Signed)
NCCSR reviewed. 

## 2018-01-18 DIAGNOSIS — E119 Type 2 diabetes mellitus without complications: Secondary | ICD-10-CM | POA: Diagnosis not present

## 2018-01-18 DIAGNOSIS — I25810 Atherosclerosis of coronary artery bypass graft(s) without angina pectoris: Secondary | ICD-10-CM | POA: Diagnosis not present

## 2018-01-18 DIAGNOSIS — I10 Essential (primary) hypertension: Secondary | ICD-10-CM | POA: Diagnosis not present

## 2018-01-18 DIAGNOSIS — J45909 Unspecified asthma, uncomplicated: Secondary | ICD-10-CM | POA: Diagnosis not present

## 2018-02-08 ENCOUNTER — Encounter: Payer: Self-pay | Admitting: Pulmonary Disease

## 2018-02-08 ENCOUNTER — Ambulatory Visit (INDEPENDENT_AMBULATORY_CARE_PROVIDER_SITE_OTHER): Payer: 59 | Admitting: Pulmonary Disease

## 2018-02-08 VITALS — BP 126/78 | HR 89 | Ht 67.0 in | Wt 238.6 lb

## 2018-02-08 DIAGNOSIS — R0602 Shortness of breath: Secondary | ICD-10-CM | POA: Diagnosis not present

## 2018-02-08 DIAGNOSIS — J9811 Atelectasis: Secondary | ICD-10-CM | POA: Diagnosis not present

## 2018-02-08 MED ORDER — PREDNISONE 10 MG PO TABS: 40.0000 mg | ORAL_TABLET | Freq: Every day | ORAL | 0 refills | Status: AC

## 2018-02-08 MED ORDER — METHYLPREDNISOLONE ACETATE 80 MG/ML IJ SUSP
80.0000 mg | Freq: Once | INTRAMUSCULAR | Status: AC
  Administered 2018-02-08: 80 mg via INTRAMUSCULAR

## 2018-02-08 MED ORDER — FLUTICASONE-SALMETEROL 250-50 MCG/DOSE IN AEPB: 1.0000 | INHALATION_SPRAY | Freq: Two times a day (BID) | RESPIRATORY_TRACT | 3 refills | Status: DC

## 2018-02-08 NOTE — Patient Instructions (Addendum)
Asthma with exacerbation Abnormal chest x-ray showing atelectasis in the right base  Obtain a CT scan of the chest with contrast Depo-Medrol injection 80 mg Prednisone 40 daily for 5 to 7 days Continue duo nebs Wixela inhaler   Continue to work on smoking cessation  I will see her back in the office in about 6 to 8 weeks

## 2018-02-08 NOTE — Progress Notes (Signed)
Kristen NipperVelvet Price    [id number]    1975/12/06  Primary Care Physician:Santiago, Meda CoffeeIrma M, MD  Referring Physician: Myles LippsSantiago, Irma M, MD [address] [place], [place] [id number]  Chief complaint:   Patient with shortness of breath  HPI:  History of asthma with exacerbations  She is an active smoker trying to cut down She has been coughing, more short of breath than usual She has been wheezing using her nebulizer treatments more often She was on Symbicort in the past-was not able to afford the inhaler  She uses albuterol MDI intermittently  History of coronary artery disease  She had another chest x-ray about 11 months ago that revealed atelectasis at the right base-she remembers being told at the time that she had a pneumonia  Outpatient Encounter Medications as of 02/08/2018  Medication Sig  . albuterol (PROVENTIL HFA;VENTOLIN HFA) 108 (90 Base) MCG/ACT inhaler Inhale 2 puffs into the lungs every 4 (four) hours as needed for wheezing or shortness of breath (cough, shortness of breath or wheezing.). Patient needs office visit  . alprazolam (XANAX) 2 MG tablet Take 0.5 tablets (1 mg total) by mouth 2 (two) times daily as needed for anxiety (essential tremors). (Patient taking differently: Take 0.25 mg by mouth 2 (two) times daily as needed for anxiety (essential tremors). )  . aspirin EC 81 MG EC tablet Take 1 tablet (81 mg total) by mouth daily.  . benzonatate (TESSALON) 100 MG capsule Take 1-2 capsules (100-200 mg total) by mouth 3 (three) times daily as needed for cough.  . cefdinir (OMNICEF) 300 MG capsule Take 2 capsules (600 mg total) by mouth daily.  . clopidogrel (PLAVIX) 75 MG tablet Take 1 tablet (75 mg total) by mouth daily.  . cyclobenzaprine (FLEXERIL) 10 MG tablet Take 0.5-1 tablets (5-10 mg total) by mouth 3 (three) times daily as needed for muscle spasms.  Marland Kitchen. etonogestrel (NEXPLANON) 68 MG IMPL implant Inject 1 each into the skin once. 06/2013  . Guaifenesin  (MUCINEX MAXIMUM STRENGTH) 1200 MG TB12 Take 1 tablet (1,200 mg total) by mouth every 12 (twelve) hours as needed.  Marland Kitchen. ipratropium-albuterol (DUONEB) 0.5-2.5 (3) MG/3ML SOLN Take 3 mLs by nebulization every 4 (four) hours as needed (shortness of breath). Patient needs office visit  . montelukast (SINGULAIR) 10 MG tablet Take 1 tablet (10 mg total) by mouth at bedtime. Patient needs office visit  . nitroGLYCERIN (NITROSTAT) 0.4 MG SL tablet Place 1 tablet (0.4 mg total) under the tongue every 5 (five) minutes x 3 doses as needed for chest pain.  Marland Kitchen. oseltamivir (TAMIFLU) 75 MG capsule Take 1 capsule (75 mg total) by mouth 2 (two) times daily.  . pantoprazole (PROTONIX) 40 MG tablet Take 1 tablet (40 mg total) by mouth 2 (two) times daily. Patient needs office visit  . predniSONE (DELTASONE) 20 MG tablet Take 3 PO QAM x3days, 2 PO QAM x3days, 1 PO QAM x3days  . [DISCONTINUED] atorvastatin (LIPITOR) 80 MG tablet TAKE 1 TABLET BY MOUTH EVERY DAY AT 6PM  . [DISCONTINUED] azelastine (ASTELIN) 0.1 % nasal spray Place 1 spray into both nostrils 2 (two) times daily. Use in each nostril as directed   No facility-administered encounter medications on file as of 02/08/2018.     Allergies as of 02/08/2018 - Review Complete 02/08/2018  Allergen Reaction Noted  . Amoxicillin Nausea And Vomiting 08/19/2015  . Augmentin [amoxicillin-pot clavulanate] Nausea And Vomiting 11/10/2012  . Benadryl [diphenhydramine] Other (See Comments) 11/10/2012  .  Losartan Cough 02/28/2017  . Penicillins Nausea And Vomiting 11/10/2012    Past Medical History:  Diagnosis Date  . Anxiety   . Asthma   . Chronic lower back pain   . Depression   . Family history of adverse reaction to anesthesia    "mom didn't want to wake up at all"  . GERD (gastroesophageal reflux disease)   . Hiatal hernia   . Hypertension   . NSTEMI (non-ST elevated myocardial infarction) (HCC) 06/06/2014   stent to circumfkex    Past Surgical History:    Procedure Laterality Date  . CARDIAC CATHETERIZATION N/A 06/07/2014   Procedure: Left Heart Cath and Coronary Angiography;  Surgeon: Lennette Bihari, MD;  Location: [facility] INVASIVE CV LAB;  Service: Cardiovascular;  Laterality: N/A;  . FEMUR FRACTURE SURGERY Left 2003   "metal rod runs from hip to knee"  . FRACTURE SURGERY      Family History  Problem Relation Age of Onset  . Coronary artery disease Mother 64       MI  . Heart attack Mother   . Hypertension Mother   . Diabetes Mother   . Coronary artery disease Father 81       MI  . Heart attack Father   . Hypertension Father   . Stroke Neg Hx     Social History   Socioeconomic History  . Marital status: Divorced    Spouse name: Not on file  . Number of children: 1  . Years of education: Not on file  . Highest education level: Not on file  Occupational History  . Occupation: Works from home for [employer]  . Financial resource strain: Not on file  . Food insecurity:    Worry: Not on file    Inability: Not on file  . Transportation needs:    Medical: Not on file    Non-medical: Not on file  Tobacco Use  . Smoking status: Former Smoker    Packs/day: 1.50    Years: 30.00    Pack years: 45.00    Types: Cigarettes  . Smokeless tobacco: Never Used  . Tobacco comment: she uses vapor 6mg  no tobacco  Substance and Sexual Activity  . Alcohol use: Yes    Alcohol/week: 0.0 standard drinks    Comment: occasionally  . Drug use: No  . Sexual activity: Not Currently    Birth control/protection: Implant    Comment: Nexplanon   Lifestyle  . Physical activity:    Days per week: Not on file    Minutes per session: Not on file  . Stress: Not on file  Relationships  . Social connections:    Talks on phone: Not on file    Gets together: Not on file    Attends religious service: Not on file    Active member of club or organization: Not on file    Attends meetings of clubs or organizations: Not on file    Relationship  status: Not on file  . Intimate partner violence:    Fear of current or ex partner: Not on file    Emotionally abused: Not on file    Physically abused: Not on file    Forced sexual activity: Not on file  Other Topics Concern  . Not on file  Social History Narrative   Caffeine intake: "Mountain dew nut" can drink an 18 pack in 2-3 days   Exercise: No    Review of Systems  Constitutional: Negative.  Negative  for fatigue and fever.  HENT: Negative.   Eyes: Negative.   Respiratory: Positive for cough, shortness of breath and wheezing.   Cardiovascular: Negative.   Gastrointestinal: Negative.   All other systems reviewed and are negative.   There were no vitals filed for this visit.   Physical Exam  Constitutional: She is oriented to person, place, and time. She appears well-developed and well-nourished.  HENT:  Head: Normocephalic and atraumatic.  Eyes: Pupils are equal, round, and reactive to light. Conjunctivae and EOM are normal. Right eye exhibits no discharge.  Neck: Normal range of motion. Neck supple. No tracheal deviation present. No thyromegaly present.  Cardiovascular: Normal rate and regular rhythm.  Pulmonary/Chest: Effort normal and breath sounds normal. No respiratory distress. She has no wheezes.  Abdominal: Soft. Bowel sounds are normal. She exhibits no distension. There is no abdominal tenderness. There is no rebound.  Musculoskeletal: Normal range of motion.        General: No deformity or edema.  Neurological: She is alert and oriented to person, place, and time.   Data Reviewed: Chest x-ray reviewed with patient showing atelectasis at the right base  Assessment:  Asthma with exacerbation  Possible obstructive lung disease with a significant history of smoking  Shortness of breath  Abnormal chest x-ray showing atelectasis at the right base  Plan/Recommendations: We will repeat CT scan of the chest  Prescribed wixela inhaler  Continue  bronchodilator use Continue nebulizer use aid  Will give her a shot of Depo Medrol  Prednisone 40 daily  We will see her in the office in about 4 to 6 weeks  Smoking cessation counseling   Virl DiamondAdewale Dacy Enrico MD [facility] 02/08/2018, 4:10 PM  CC: Myles LippsSantiago, Irma M, MD

## 2018-02-08 NOTE — Progress Notes (Signed)
   Subjective:    Patient ID: [name], female    DOB: Dec 24, 1975, 43 y.o.   MRN: [id number]  HPI    Review of Systems  Constitutional: Negative for fever and unexpected weight change.  HENT: Positive for ear pain, rhinorrhea and sneezing. Negative for congestion, dental problem, nosebleeds, postnasal drip, sinus pressure, sore throat and trouble swallowing.   Eyes: Negative for redness and itching.  Respiratory: Positive for cough, chest tightness, shortness of breath and wheezing.   Cardiovascular: Negative for palpitations and leg swelling.  Gastrointestinal: Negative for nausea and vomiting.  Genitourinary: Negative for dysuria.  Musculoskeletal: Negative for joint swelling.  Skin: Negative for rash.  Allergic/Immunologic: Negative.  Negative for environmental allergies, food allergies and immunocompromised state.  Neurological: Negative for headaches.  Hematological: Does not bruise/bleed easily.  Psychiatric/Behavioral: Negative for dysphoric mood. The patient is not nervous/anxious.        Objective:   Physical Exam        Assessment & Plan:

## 2018-02-16 DIAGNOSIS — E119 Type 2 diabetes mellitus without complications: Secondary | ICD-10-CM | POA: Diagnosis not present

## 2018-02-16 DIAGNOSIS — J45909 Unspecified asthma, uncomplicated: Secondary | ICD-10-CM | POA: Diagnosis not present

## 2018-02-16 DIAGNOSIS — J069 Acute upper respiratory infection, unspecified: Secondary | ICD-10-CM | POA: Diagnosis not present

## 2018-02-21 ENCOUNTER — Encounter: Payer: Self-pay | Admitting: Physician Assistant

## 2018-02-21 NOTE — Progress Notes (Signed)
Cardiology Office Note    Date:  02/22/2018   ID:  Kristen Price, DOB 08-31-1975, MRN [id number]  PCP:  Myles Lipps, MD  Cardiologist: Verne Carrow, MD EPS: None  Chief Complaint  Patient presents with  . Follow-up    History of Present Illness:  Kristen Price is a 43 y.o. female with history of NSTEMI 06/2014 treated with DES to proximal circumflex.  Normal LVEF 60% on 2D echo.  Last seen in our office 02/2015 at which time she had continued to smoke.  She was a no-show for appointment 02/2017 and Dr. Clifton James agreed to stop her Plavix.  Was recommended she follow-up before any refills will be given.  Patient referred back to [place] by Dr. Docia Chuck.  Recently diagnosed with diabetes. Patient had moved to [place], [place] and had to have gallbladder out in August 2019 and was cleared by a cardiologist there. Has since moved back here.  Patient had fever, chills and was drinking coke and developed central chest pain while laying down that eased with belching. She had taken a NTG but it hadn't dissolved by the time the pain eased.  Is the only episode she has had since her stent.  She thinks it was indigestion.  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 Trouble with asthma. No regular exercise. Smokes 1/2-1 ppd.  Has lost 60 pounds in a month when they took her birth control rod out and she was diagnosed with diabetes.  She has plateaued and cannot lose anymore weight.    Past Medical History:  Diagnosis Date  . Anxiety   . Asthma   . Chronic lower back pain   . Depression   . Diabetes (HCC) 2019  . Family history of adverse reaction to anesthesia    "mom didn't want to wake up at all"  . GERD (gastroesophageal reflux disease)   . Hiatal hernia   . Hypertension   . NSTEMI (non-ST elevated myocardial infarction) (HCC) 06/06/2014   stent to circumfkex  . Obesity     Past Surgical History:  Procedure Laterality Date  . CARDIAC CATHETERIZATION N/A  06/07/2014   Procedure: Left Heart Cath and Coronary Angiography;  Surgeon: Lennette Bihari, MD;  Location: [facility] INVASIVE CV LAB;  Service: Cardiovascular;  Laterality: N/A;  . CHOLECYSTECTOMY  2019  . FEMUR FRACTURE SURGERY Left 2003   "metal rod runs from hip to knee"  . FRACTURE SURGERY    . HOODED EYE SURGERY      Current Medications: Current Meds  Medication Sig  . albuterol (PROVENTIL HFA;VENTOLIN HFA) 108 (90 Base) MCG/ACT inhaler Inhale 2 puffs into the lungs every 4 (four) hours as needed for wheezing or shortness of breath (cough, shortness of breath or wheezing.). Patient needs office visit  . alprazolam (XANAX) 2 MG tablet Take 0.5 tablets (1 mg total) by mouth 2 (two) times daily as needed for anxiety (essential tremors). (Patient taking differently: Take 0.25 mg by mouth 2 (two) times daily as needed for anxiety (essential tremors). )  . aspirin EC 81 MG EC tablet Take 1 tablet (81 mg total) by mouth daily.  . benzonatate (TESSALON) 100 MG capsule Take 1-2 capsules (100-200 mg total) by mouth 3 (three) times daily as needed for cough.  Marland Kitchen ipratropium-albuterol (DUONEB) 0.5-2.5 (3) MG/3ML SOLN Take 3 mLs by nebulization every 4 (four) hours as needed (shortness of breath). Patient needs office visit  . montelukast (SINGULAIR) 10 MG tablet Take 1 tablet (  10 mg total) by mouth at bedtime. Patient needs office visit  . nitroGLYCERIN (NITROSTAT) 0.4 MG SL tablet Place 1 tablet (0.4 mg total) under the tongue every 5 (five) minutes x 3 doses as needed for chest pain.  . pantoprazole (PROTONIX) 40 MG tablet Take 1 tablet (40 mg total) by mouth 2 (two) times daily. Patient needs office visit  . predniSONE (DELTASONE) 10 MG tablet Take 4 tablets (40 mg total) by mouth daily with breakfast for 14 days.  . [DISCONTINUED] nitroGLYCERIN (NITROSTAT) 0.4 MG SL tablet Place 1 tablet (0.4 mg total) under the tongue every 5 (five) minutes x 3 doses as needed for chest pain.     Allergies:    Amoxicillin; Augmentin [amoxicillin-pot clavulanate]; Benadryl [diphenhydramine]; Losartan; and Penicillins   Social History   Socioeconomic History  . Marital status: Divorced    Spouse name: Not on file  . Number of children: 1  . Years of education: Not on file  . Highest education level: Not on file  Occupational History  . Occupation: Works from home for [employer]  . Financial resource strain: Not on file  . Food insecurity:    Worry: Not on file    Inability: Not on file  . Transportation needs:    Medical: Not on file    Non-medical: Not on file  Tobacco Use  . Smoking status: Former Smoker    Packs/day: 1.50    Years: 30.00    Pack years: 45.00    Types: Cigarettes  . Smokeless tobacco: Never Used  . Tobacco comment: she uses vapor 6mg  no tobacco  Substance and Sexual Activity  . Alcohol use: Yes    Alcohol/week: 0.0 standard drinks    Comment: occasionally  . Drug use: No  . Sexual activity: Not Currently    Birth control/protection: Implant    Comment: Nexplanon   Lifestyle  . Physical activity:    Days per week: Not on file    Minutes per session: Not on file  . Stress: Not on file  Relationships  . Social connections:    Talks on phone: Not on file    Gets together: Not on file    Attends religious service: Not on file    Active member of club or organization: Not on file    Attends meetings of clubs or organizations: Not on file    Relationship status: Not on file  Other Topics Concern  . Not on file  Social History Narrative   Caffeine intake: "Mountain dew nut" can drink an 18 pack in 2-3 days   Exercise: No     Family History:  The patient's family history includes Asthma in her mother; Coronary artery disease (age of onset: 68) in her father; Coronary artery disease (age of onset: 97) in her mother; Diabetes in her mother; Heart attack in her father and mother; Hypertension in her father and mother.   ROS:   Please see the history  of present illness.    Review of Systems  Constitution: Positive for weight loss.  HENT: Negative.   Eyes: Negative.   Cardiovascular: Positive for dyspnea on exertion.  Respiratory: Positive for cough and wheezing.   Hematologic/Lymphatic: Negative.   Musculoskeletal: Negative.  Negative for joint pain.  Gastrointestinal: Negative.   Genitourinary: Negative.   Neurological: Negative.   Psychiatric/Behavioral: The patient is nervous/anxious.    All other systems reviewed and are negative.   PHYSICAL EXAM:   VS:  BP 116/70  Pulse 85   Ht 5\' 7"  (1.702 m)   Wt 234 lb 1.9 oz (106.2 kg)   SpO2 96%   BMI 36.67 kg/m   Physical Exam  GEN: Obese, in no acute distress  Neck: no JVD, carotid bruits, or masses Cardiac:RRR; no murmurs, rubs, or gallops  Respiratory: Decreased breath sound throughout GI: soft, nontender, nondistended, + BS Ext: without cyanosis, clubbing, or edema, Good distal pulses bilaterally Neuro:  Alert and Oriented x 3 Psych: euthymic mood, full affect  Wt Readings from Last 3 Encounters:  02/22/18 234 lb 1.9 oz (106.2 kg)  02/08/18 238 lb 9.6 oz (108.2 kg)  03/23/17 277 lb 12.8 oz (126 kg)      Studies/Labs Reviewed:   EKG:  EKG is  ordered today.  The ekg ordered today demonstrates normal sinus rhythm, normal EKG  Recent Labs: No results found for requested labs within last 8760 hours.   Lipid Panel    Component Value Date/Time   CHOL 129 09/16/2014 1208   TRIG 108.0 09/16/2014 1208   HDL 30.80 (L) 09/16/2014 1208   CHOLHDL 4 09/16/2014 1208   VLDL 21.6 09/16/2014 1208   LDLCALC 77 09/16/2014 1208    Additional studies/ records that were reviewed today include:   2D echo 6/3/2016Study Conclusions   - Left ventricle: There may be mild decreased thickening at the   base of the inferior wall. The cavity size was normal. Wall   thickness was normal. The estimated ejection fraction was 60%. - Right ventricle: The cavity size was normal.  Systolic function   was normal.   Cardiac catheterization 6/3/20161st Diag lesion, 20% stenosed.  Prox Cx lesion, 90% stenosed. There is a 0% residual stenosis post intervention.  A drug-eluting stent was placed.   Preserved global LV contractility with an ejection fraction of 55% but with evidence for small focal region of mid to basal inferior hypocontractility.   Predominant single-vessel coronary obstructive disease with evidence for mild 20% narrowing in the very proximal first diagonal branch of the LAD with an otherwise normal LAD system; 90% percent eccentric stenosis in the proximal left circumflex coronary artery arising immediately after the left atrial circumflex branch and a dominant left circumflex system.  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   Successful PCI of the left circumflex coronary artery with insertion of a 3.518 mm Resolute DES stent postdilated to 3.76 mm with the 90% stenosis being reduced to 0%.   RECOMMENDATION:   The patient will continue with antiplatelets therapy for minimum of a year.  She was screened to participate in the Twilight study involving Brilinta/aspirin.  Smoking cessation is imperative.  Aggressive lipid-lowering therapy will be instituted along with medical therapy for her CAD.     ASSESSMENT:    1. Coronary artery disease involving native coronary artery of native heart without angina pectoris   2. Tobacco abuse   3. Hyperlipidemia, unspecified hyperlipidemia type   4. Type 2 diabetes mellitus with hyperosmolarity without coma, without long-term current use of insulin (HCC)   5. Obesity (BMI 35.0-39.9 without comorbidity)      PLAN:  In order of problems listed above:   CAD status post NSTEMI 06/06/2014 treated with DES to the proximal circumflex, normal LVEF postop.  Patient's Plavix was stopped a year ago  and she remains on aspirin.  Will restart Lipitor and get a fasting lipid  panel.  Follow-up with Dr. Clifton James in 1 year.  Tobacco abuse smokes one half to whole pack of cigarettes daily.  Long discussion on smoking cessation.  Will prescribe nicotine patches  Hyperlipidemia restart Lipitor 80 mg daily and check fasting lipid panel this week and again in 3 months with LFTs.  Diabetes mellitus new diagnosis managed by PCP  Obesity exercise and weight loss recommended.   Medication Adjustments/Labs and Tests Ordered: Current medicines are reviewed at length with the patient today.  Concerns regarding medicines are outlined above.  Medication changes, Labs and Tests ordered today are listed in the Patient Instructions below. Patient Instructions  Medication Instructions:  Your physician has recommended you make the following change in your medication:   1. START: atorvastatin (lipitor) 80 mg tablet: Take 1 tablet by mouth once a day  2. A prescription for nicotine patches has been given. Use as directed   Lab work: Your physician recommends that you return for a FASTING lipid profile this week  Your physician recommends that you return for a FASTING lipid profile and liver function panel in 3 MONTHS   If you have labs (blood work) drawn today and your tests are completely normal, you will receive your results only by: Marland Kitchen MyChart Message (if you have MyChart) OR . A paper copy in the mail If you have any lab test that is abnormal or we need to change your treatment, we will call you to review the results.  Testing/Procedures: None ordered  Follow-Up: At [facility], you and your health needs are our priority.  As part of our continuing mission to provide you with exceptional heart care, we have created designated Provider Care Teams.  These Care Teams include your primary Cardiologist (physician) and Advanced Practice Providers (APPs -  Physician Assistants and Nurse Practitioners)  who all work together to provide you with the care you need, when you need it. . You will need a follow up appointment in 1 year.  Please call our office 2 months in advance to schedule this appointment.  You may see Earney Hamburg, MD or one of the following Advanced Practice Providers on your designated Care Team:   . Robbie Lis, PA-C . Dayna Dunn, PA-C . Jacolyn Reedy, PA-C  Any Other Special Instructions Will Be Listed Below (If Applicable).  Your physician discussed the importance of regular exercise and recommended that you start or continue a regular exercise program for good health.   Steps to Quit Smoking  Smoking tobacco can be bad for your health. It can also affect almost every organ in your body. Smoking puts you and people around you at risk for many serious long-lasting (chronic) diseases. Quitting smoking is hard, but it is one of the best things that you can do for your health. It is never too late to quit. What are the benefits of quitting smoking? When you quit smoking, you lower your risk for getting serious diseases and conditions. They can include:  Lung cancer or lung disease.  Heart disease.  Stroke.  Heart attack.  Not being able to have children (infertility).  Weak bones (osteoporosis) and broken bones (fractures). If you have coughing, wheezing, and shortness of breath, those symptoms may get better when you quit. You may also get sick less often. If you are pregnant, quitting smoking can help to lower your chances of having a baby of low birth weight. What can I do to help me quit smoking? Talk with your doctor about what  can help you quit smoking. Some things you can do (strategies) include:  Quitting smoking totally, instead of slowly cutting back how much you smoke over a period of time.  Going to in-person counseling. You are more likely to quit if you go to many counseling sessions.  Using resources and support systems, such as: ? Building control surveyornline  chats with a Veterinary surgeoncounselor. ? Phone quitlines. ? Automotive engineerrinted self-help materials. ? Support groups or group counseling. ? Text messaging programs. ? Mobile phone apps or applications.  Taking medicines. Some of these medicines may have nicotine in them. If you are pregnant or breastfeeding, do not take any medicines to quit smoking unless your doctor says it is okay. Talk with your doctor about counseling or other things that can help you. Talk with your doctor about using more than one strategy at the same time, such as taking medicines while you are also going to in-person counseling. This can help make quitting easier. What things can I do to make it easier to quit? Quitting smoking might feel very hard at first, but there is a lot that you can do to make it easier. Take these steps:  Talk to your family and friends. Ask them to support and encourage you.  Call phone quitlines, reach out to support groups, or work with a Veterinary surgeoncounselor.  Ask people who smoke to not smoke around you.  Avoid places that make you want (trigger) to smoke, such as: ? Bars. ? Parties. ? Smoke-break areas at work.  Spend time with people who do not smoke.  Lower the stress in your life. Stress can make you want to smoke. Try these things to help your stress: ? Getting regular exercise. ? Deep-breathing exercises. ? Yoga. ? Meditating. ? Doing a body scan. To do this, close your eyes, focus on one area of your body at a time from head to toe, and notice which parts of your body are tense. Try to relax the muscles in those areas.  Download or buy apps on your mobile phone or tablet that can help you stick to your quit plan. There are many free apps, such as QuitGuide from the [employer] developer(Centers for Disease Control and Prevention). You can find more support from smokefree.gov and other websites. This information is not intended to replace advice given to you by your health care provider. Make sure you discuss any questions  you have with your health care provider. Document Released: 10/17/2008 Document Revised: 08/19/2015 Document Reviewed: 05/07/2014 Elsevier Interactive Patient Education  [address]2019 Elsevier Inc.      Signed, Jacolyn ReedyMichele , [place] JerseyPA-C  02/22/2018 9:24 AM    [facility] [address], [place], [place]  [address] Phone: [phone]; Fax: [phone]

## 2018-02-22 ENCOUNTER — Ambulatory Visit (INDEPENDENT_AMBULATORY_CARE_PROVIDER_SITE_OTHER): Payer: 59 | Admitting: Physician Assistant

## 2018-02-22 ENCOUNTER — Encounter: Payer: Self-pay | Admitting: Physician Assistant

## 2018-02-22 VITALS — BP 116/70 | HR 85 | Ht 67.0 in | Wt 234.1 lb

## 2018-02-22 DIAGNOSIS — I2510 Atherosclerotic heart disease of native coronary artery without angina pectoris: Secondary | ICD-10-CM | POA: Diagnosis not present

## 2018-02-22 DIAGNOSIS — Z720 Tobacco use: Secondary | ICD-10-CM

## 2018-02-22 DIAGNOSIS — E785 Hyperlipidemia, unspecified: Secondary | ICD-10-CM

## 2018-02-22 DIAGNOSIS — E669 Obesity, unspecified: Secondary | ICD-10-CM

## 2018-02-22 DIAGNOSIS — E1100 Type 2 diabetes mellitus with hyperosmolarity without nonketotic hyperglycemic-hyperosmolar coma (NKHHC): Secondary | ICD-10-CM

## 2018-02-22 DIAGNOSIS — E119 Type 2 diabetes mellitus without complications: Secondary | ICD-10-CM | POA: Insufficient documentation

## 2018-02-22 MED ORDER — ATORVASTATIN CALCIUM 80 MG PO TABS: 80.0000 mg | ORAL_TABLET | Freq: Every day | ORAL | 3 refills | Status: DC

## 2018-02-22 MED ORDER — NICOTINE 21 MG/24HR TD PT24: MEDICATED_PATCH | TRANSDERMAL | 0 refills | Status: DC

## 2018-02-22 MED ORDER — NITROGLYCERIN 0.4 MG SL SUBL: 0.4000 mg | SUBLINGUAL_TABLET | SUBLINGUAL | 12 refills | Status: DC | PRN

## 2018-02-22 NOTE — Patient Instructions (Signed)
Medication Instructions:  Your physician has recommended you make the following change in your medication:   1. START: atorvastatin (lipitor) 80 mg tablet: Take 1 tablet by mouth once a day  2. A prescription for nicotine patches has been given. Use as directed   Lab work: Your physician recommends that you return for a FASTING lipid profile this week  Your physician recommends that you return for a FASTING lipid profile and liver function panel in 3 MONTHS   If you have labs (blood work) drawn today and your tests are completely normal, you will receive your results only by: Marland Kitchen MyChart Message (if you have MyChart) OR . A paper copy in the mail If you have any lab test that is abnormal or we need to change your treatment, we will call you to review the results.  Testing/Procedures: None ordered  Follow-Up: At [facility], you and your health needs are our priority.  As part of our continuing mission to provide you with exceptional heart care, we have created designated Provider Care Teams.  These Care Teams include your primary Cardiologist (physician) and Advanced Practice Providers (APPs -  Physician Assistants and Nurse Practitioners) who all work together to provide you with the care you need, when you need it. . You will need a follow up appointment in 1 year.  Please call our office 2 months in advance to schedule this appointment.  You may see Earney Hamburg, MD or one of the following Advanced Practice Providers on your designated Care Team:   . Robbie Lis, PA-C . Dayna Dunn, PA-C . Jacolyn Reedy, PA-C  Any Other Special Instructions Will Be Listed Below (If Applicable).  Your physician discussed the importance of regular exercise and recommended that you start or continue a regular exercise program for good health.   Steps to Quit Smoking  Smoking tobacco can be bad for your health. It can also affect almost every organ in your body. Smoking puts you and people  around you at risk for many serious long-lasting (chronic) diseases. Quitting smoking is hard, but it is one of the best things that you can do for your health. It is never too late to quit. What are the benefits of quitting smoking? When you quit smoking, you lower your risk for getting serious diseases and conditions. They can include:  Lung cancer or lung disease.  Heart disease.  Stroke.  Heart attack.  Not being able to have children (infertility).  Weak bones (osteoporosis) and broken bones (fractures). If you have coughing, wheezing, and shortness of breath, those symptoms may get better when you quit. You may also get sick less often. If you are pregnant, quitting smoking can help to lower your chances of having a baby of low birth weight. What can I do to help me quit smoking? Talk with your doctor about what can help you quit smoking. Some things you can do (strategies) include:  Quitting smoking totally, instead of slowly cutting back how much you smoke over a period of time.  Going to in-person counseling. You are more likely to quit if you go to many counseling sessions.  Using resources and support systems, such as: ? Agricultural engineer with a Veterinary surgeon. ? Phone quitlines. ? Automotive engineer. ? Support groups or group counseling. ? Text messaging programs. ? Mobile phone apps or applications.  Taking medicines. Some of these medicines may have nicotine in them. If you are pregnant or breastfeeding, do not take any medicines to quit  smoking unless your doctor says it is okay. Talk with your doctor about counseling or other things that can help you. Talk with your doctor about using more than one strategy at the same time, such as taking medicines while you are also going to in-person counseling. This can help make quitting easier. What things can I do to make it easier to quit? Quitting smoking might feel very hard at first, but there is a lot that you can do to  make it easier. Take these steps:  Talk to your family and friends. Ask them to support and encourage you.  Call phone quitlines, reach out to support groups, or work with a Veterinary surgeon.  Ask people who smoke to not smoke around you.  Avoid places that make you want (trigger) to smoke, such as: ? Bars. ? Parties. ? Smoke-break areas at work.  Spend time with people who do not smoke.  Lower the stress in your life. Stress can make you want to smoke. Try these things to help your stress: ? Getting regular exercise. ? Deep-breathing exercises. ? Yoga. ? Meditating. ? Doing a body scan. To do this, close your eyes, focus on one area of your body at a time from head to toe, and notice which parts of your body are tense. Try to relax the muscles in those areas.  Download or buy apps on your mobile phone or tablet that can help you stick to your quit plan. There are many free apps, such as QuitGuide from the [employer] developer for Disease Control and Prevention). You can find more support from smokefree.gov and other websites. This information is not intended to replace advice given to you by your health care provider. Make sure you discuss any questions you have with your health care provider. Document Released: 10/17/2008 Document Revised: 08/19/2015 Document Reviewed: 05/07/2014 Elsevier Interactive Patient Education  2019 [employer].

## 2018-02-28 ENCOUNTER — Other Ambulatory Visit: Payer: 59

## 2018-03-01 DIAGNOSIS — E119 Type 2 diabetes mellitus without complications: Secondary | ICD-10-CM | POA: Diagnosis not present

## 2018-03-01 DIAGNOSIS — J45909 Unspecified asthma, uncomplicated: Secondary | ICD-10-CM | POA: Diagnosis not present

## 2018-03-01 DIAGNOSIS — Z6836 Body mass index (BMI) 36.0-36.9, adult: Secondary | ICD-10-CM | POA: Diagnosis not present

## 2018-03-02 ENCOUNTER — Other Ambulatory Visit: Payer: 59

## 2018-03-09 ENCOUNTER — Other Ambulatory Visit: Payer: Self-pay | Admitting: Pulmonary Disease

## 2018-03-09 ENCOUNTER — Inpatient Hospital Stay: Admission: RE | Admit: 2018-03-09 | Payer: 59 | Source: Ambulatory Visit

## 2018-03-09 DIAGNOSIS — R81 Glycosuria: Secondary | ICD-10-CM | POA: Diagnosis not present

## 2018-03-09 DIAGNOSIS — J101 Influenza due to other identified influenza virus with other respiratory manifestations: Secondary | ICD-10-CM | POA: Diagnosis not present

## 2018-03-09 DIAGNOSIS — J9811 Atelectasis: Secondary | ICD-10-CM

## 2018-03-09 DIAGNOSIS — R6889 Other general symptoms and signs: Secondary | ICD-10-CM | POA: Diagnosis not present

## 2018-03-09 DIAGNOSIS — E1100 Type 2 diabetes mellitus with hyperosmolarity without nonketotic hyperglycemic-hyperosmolar coma (NKHHC): Secondary | ICD-10-CM

## 2018-03-10 ENCOUNTER — Telehealth: Payer: Self-pay | Admitting: Pulmonary Disease

## 2018-03-10 DIAGNOSIS — E1100 Type 2 diabetes mellitus with hyperosmolarity without nonketotic hyperglycemic-hyperosmolar coma (NKHHC): Secondary | ICD-10-CM

## 2018-03-10 NOTE — Telephone Encounter (Signed)
Per Herbert Seta the pt was informed to have the labs done and order was placed stat  [facility] for the pt to be sure that she knows needs this done prior to ct

## 2018-03-10 NOTE — Telephone Encounter (Signed)
Gerri Spore Long CT calling about CT scheduled for next week for patient. The order for a Bmet is in but there is not scheduled date.  She wants to know if patient is going to come back to office to get the labs drawn.  She needs Bmet prior to CT due to being diabetic.

## 2018-03-13 ENCOUNTER — Inpatient Hospital Stay: Admission: RE | Admit: 2018-03-13 | Payer: 59 | Source: Ambulatory Visit

## 2018-03-14 ENCOUNTER — Telehealth: Payer: Self-pay | Admitting: Pulmonary Disease

## 2018-03-14 NOTE — Telephone Encounter (Signed)
Spoke with Rebecka Apley at CT She is able to see the correct order Nothing further needed; will sign off

## 2018-03-14 NOTE — Telephone Encounter (Signed)
Kristen Price, CT, was calling to clarify if this pt is to have this Bmet completed at our office or at [facility] CT prior to her CT scan. Cb is [phone].

## 2018-03-14 NOTE — Telephone Encounter (Signed)
Attempted to call pt but unable to reach and unable to leave a VM due to no mailbox kicking in.  Called [facility] CT and spoke with Rebecka Apley to let her know that we have attempted to contact pt in regards to needing to have BMET drawn prior to CT but have been unable to reach her. Per Rebecka Apley, if pt does not get BMET done prior to coming for CT, they could draw labs there and Rebecka Apley asked if we could place order for ISTAT Creatinine. Order has been placed and nothing further needed.

## 2018-03-15 ENCOUNTER — Ambulatory Visit (HOSPITAL_COMMUNITY): Admission: RE | Admit: 2018-03-15 | Payer: 59 | Source: Ambulatory Visit

## 2018-03-15 ENCOUNTER — Telehealth: Payer: Self-pay | Admitting: Pulmonary Disease

## 2018-03-15 NOTE — Telephone Encounter (Signed)
AO pt didn't show up for CT chest per [facility]. Her appt was at 7am but was a no show. Spoke with pt, she stated that she forgot and she will call and reschedule her appt. FYI

## 2018-04-17 DIAGNOSIS — J209 Acute bronchitis, unspecified: Secondary | ICD-10-CM | POA: Diagnosis not present

## 2018-05-18 DIAGNOSIS — H9209 Otalgia, unspecified ear: Secondary | ICD-10-CM | POA: Diagnosis not present

## 2018-05-18 DIAGNOSIS — R1110 Vomiting, unspecified: Secondary | ICD-10-CM | POA: Diagnosis not present

## 2018-05-24 ENCOUNTER — Other Ambulatory Visit: Payer: 59

## 2018-09-12 ENCOUNTER — Emergency Department (HOSPITAL_COMMUNITY): Payer: Self-pay

## 2018-09-12 ENCOUNTER — Other Ambulatory Visit: Payer: Self-pay

## 2018-09-12 ENCOUNTER — Emergency Department (HOSPITAL_COMMUNITY)
Admission: EM | Admit: 2018-09-12 | Discharge: 2018-09-12 | Disposition: A | Discharge status: Home / Self Care | Payer: Self-pay | Attending: Emergency Medicine | Admitting: Emergency Medicine

## 2018-09-12 ENCOUNTER — Encounter (HOSPITAL_COMMUNITY): Payer: Self-pay

## 2018-09-12 DIAGNOSIS — Z87891 Personal history of nicotine dependence: Secondary | ICD-10-CM | POA: Insufficient documentation

## 2018-09-12 DIAGNOSIS — E119 Type 2 diabetes mellitus without complications: Secondary | ICD-10-CM | POA: Diagnosis not present

## 2018-09-12 DIAGNOSIS — I10 Essential (primary) hypertension: Secondary | ICD-10-CM | POA: Diagnosis not present

## 2018-09-12 DIAGNOSIS — M25511 Pain in right shoulder: Secondary | ICD-10-CM | POA: Insufficient documentation

## 2018-09-12 DIAGNOSIS — Y999 Unspecified external cause status: Secondary | ICD-10-CM | POA: Diagnosis not present

## 2018-09-12 DIAGNOSIS — R059 Cough, unspecified: Secondary | ICD-10-CM

## 2018-09-12 DIAGNOSIS — Y92002 Bathroom of unspecified non-institutional (private) residence single-family (private) house as the place of occurrence of the external cause: Secondary | ICD-10-CM | POA: Diagnosis not present

## 2018-09-12 DIAGNOSIS — Z7982 Long term (current) use of aspirin: Secondary | ICD-10-CM | POA: Insufficient documentation

## 2018-09-12 DIAGNOSIS — I2510 Atherosclerotic heart disease of native coronary artery without angina pectoris: Secondary | ICD-10-CM | POA: Insufficient documentation

## 2018-09-12 DIAGNOSIS — W19XXXA Unspecified fall, initial encounter: Secondary | ICD-10-CM

## 2018-09-12 DIAGNOSIS — W182XXA Fall in (into) shower or empty bathtub, initial encounter: Secondary | ICD-10-CM | POA: Insufficient documentation

## 2018-09-12 DIAGNOSIS — J45909 Unspecified asthma, uncomplicated: Secondary | ICD-10-CM | POA: Diagnosis not present

## 2018-09-12 DIAGNOSIS — Z20828 Contact with and (suspected) exposure to other viral communicable diseases: Secondary | ICD-10-CM | POA: Insufficient documentation

## 2018-09-12 DIAGNOSIS — R05 Cough: Secondary | ICD-10-CM | POA: Insufficient documentation

## 2018-09-12 DIAGNOSIS — Z79899 Other long term (current) drug therapy: Secondary | ICD-10-CM | POA: Diagnosis not present

## 2018-09-12 DIAGNOSIS — Y93E1 Activity, personal bathing and showering: Secondary | ICD-10-CM | POA: Diagnosis not present

## 2018-09-12 DIAGNOSIS — Z959 Presence of cardiac and vascular implant and graft, unspecified: Secondary | ICD-10-CM | POA: Insufficient documentation

## 2018-09-12 LAB — SARS CORONAVIRUS 2 (TAT 6-24 HRS): SARS Coronavirus 2: NEGATIVE

## 2018-09-12 MED ORDER — HYDROCODONE-ACETAMINOPHEN 5-325 MG PO TABS: ORAL_TABLET | ORAL | 0 refills | Status: DC

## 2018-09-12 MED ORDER — PREDNISONE 10 MG PO TABS: ORAL_TABLET | ORAL | 0 refills | Status: DC

## 2018-09-12 MED ORDER — BENZONATATE 200 MG PO CAPS: 200.0000 mg | ORAL_CAPSULE | Freq: Three times a day (TID) | ORAL | 0 refills | Status: DC | PRN

## 2018-09-12 MED ORDER — PREDNISONE 50 MG PO TABS
60.0000 mg | ORAL_TABLET | Freq: Once | ORAL | Status: AC
  Administered 2018-09-12: 60 mg via ORAL
  Filled 2018-09-12: qty 1

## 2018-09-12 MED ORDER — ALBUTEROL SULFATE HFA 108 (90 BASE) MCG/ACT IN AERS
4.0000 | INHALATION_SPRAY | Freq: Once | RESPIRATORY_TRACT | Status: AC
  Administered 2018-09-12: 10:00:00 4 via RESPIRATORY_TRACT
  Filled 2018-09-12: qty 6.7

## 2018-09-12 NOTE — Discharge Instructions (Signed)
2 puffs of the albuterol inhaler 4 times a day for 3 days then use as needed.  Start the prednisone prescription tomorrow take as directed.  Apply ice packs on and off to your shoulder.  You may contact the orthopedic provider listed to arrange a follow-up appointment in a few days if your symptoms are not improving.

## 2018-09-12 NOTE — ED Triage Notes (Signed)
Pt reports has had a cough for the past 2 weeks.  Reports she has asthma.  Pt says she has had sob but clears up with her breathing treatments.  .  Pt says Friday  she coughed so much in the shower she got dizzy and fell.   C/O pain to r shoulder.

## 2018-09-14 NOTE — ED Provider Notes (Signed)
[facility]ERGENCY DEPARTMENT Provider Note   CSN: [id number] Arrival date & time: 09/12/18  [id number]     History   Chief Complaint Chief Complaint  Patient presents with  . Cough  . Shoulder Pain    HPI Kristen Price is a 43 y.o. female.     HPI   Kristen Price is a 43 y.o. female who presents to the Emergency Department complaining of cough, nasal congestion, and rhinorrhea.  Symptoms have been present for 2 weeks.  She states the cough has been nonproductive and associated with wheezing and shortness of breath, but only with coughing.  She reports frequent asthma flares and states her symptoms feel similar to previous.  She states that she had a "coughing episode" 4 days ago that resulted in a fall while taking a shower.  She states that she fell inside the shower landing on her right shoulder.  She reports pain to the shoulder and difficulty with raising her arm above her head.  She denies head injury, LOC, neck or back pain, numbness or weakness of her upper extremities.  She states she has been using her albuterol nebulizer with only minimal relief.  She states that she always wears a mask when in public and denies known COVID exposures.  No fevers, chills, vomiting or diarrhea, and chest pain.   Past Medical History:  Diagnosis Date  . Anxiety   . Asthma   . Chronic lower back pain   . Depression   . Diabetes (HCC) 2019  . Family history of adverse reaction to anesthesia    "mom didn't want to wake up at all"  . GERD (gastroesophageal reflux disease)   . Hiatal hernia   . Hypertension   . NSTEMI (non-ST elevated myocardial infarction) (HCC) 06/06/2014   stent to circumfkex  . Obesity     Patient Active Problem List   Diagnosis Date Noted  . Diabetes mellitus (HCC) 02/22/2018  . Obesity (BMI 35.0-39.9 without comorbidity) 02/22/2018  . Mild intermittent asthma without complication 03/06/2017  . Conductive hearing loss of left ear 08/19/2015  . Gallbladder disease  03/03/2015  . Coronary artery disease involving native coronary artery of native heart without angina pectoris 06/24/2014  . Elevated troponin   . History of non-ST elevation myocardial infarction (NSTEMI) 06/06/2014  . Tobacco abuse 06/06/2014  . Chronic low back pain 10/25/2011  . GAD (generalized anxiety disorder) 09/27/2011  . Insomnia 09/27/2011    Past Surgical History:  Procedure Laterality Date  . CARDIAC CATHETERIZATION N/A 06/07/2014   Procedure: Left Heart Cath and Coronary Angiography;  Surgeon: Lennette Biharihomas A Kelly, MD;  [facility];  Service: Cardiovascular;  Laterality: N/A;  . CHOLECYSTECTOMY  2019  . FEMUR FRACTURE SURGERY Left 2003   "metal rod runs from hip to knee"  . FRACTURE SURGERY    . HOODED EYE SURGERY       OB History   No obstetric history on file.      Home Medications    Prior to Admission medications   Medication Sig Start Date End Date Taking? Authorizing Provider  albuterol (PROVENTIL HFA;VENTOLIN HFA) 108 (90 Base) MCG/ACT inhaler Inhale 2 puffs into the lungs every 4 (four) hours as needed for wheezing or shortness of breath (cough, shortness of breath or wheezing.). Patient needs office visit 05/04/17  Yes Myles LippsSantiago, Irma M, MD  alprazolam Prudy Feeler(XANAX) 2 MG tablet Take 0.5 tablets (1 mg total) by mouth 2 (two) times daily as needed for anxiety (essential  tremors). Patient taking differently: Take 0.25 mg by mouth 2 (two) times daily as needed for anxiety (essential tremors).  05/11/17  Yes Myles Lipps, MD  aspirin EC 81 MG EC tablet Take 1 tablet (81 mg total) by mouth daily. 06/08/14  Yes Dwana Melena, PA-C  atorvastatin (LIPITOR) 80 MG tablet Take 1 tablet (80 mg total) by mouth daily. 02/22/18  Yes Dyann Kief, PA-C  ipratropium-albuterol (DUONEB) 0.5-2.5 (3) MG/3ML SOLN Take 3 mLs by nebulization every 4 (four) hours as needed (shortness of breath). Patient needs office visit 05/04/17  Yes Myles Lipps, MD  montelukast (SINGULAIR)  10 MG tablet Take 1 tablet (10 mg total) by mouth at bedtime. Patient needs office visit 05/04/17  Yes Myles Lipps, MD  nicotine (NICODERM CQ) 21 mg/24hr patch Apply 21mg  patch one daily for 6 weeks (dispense #42), then change to 14mg  patch one daily for 2 weeks (dispense #14), then change to 7mg  patch one daily for 2 weeks (dispense #14). Remove old patch before applying new one. 02/22/18  Yes Dyann Kief, PA-C  nitroGLYCERIN (NITROSTAT) 0.4 MG SL tablet Place 1 tablet (0.4 mg total) under the tongue every 5 (five) minutes x 3 doses as needed for chest pain. 02/22/18  Yes Dyann Kief, PA-C  pantoprazole (PROTONIX) 40 MG tablet Take 1 tablet (40 mg total) by mouth 2 (two) times daily. Patient needs office visit 05/04/17  Yes Myles Lipps, MD  benzonatate (TESSALON) 200 MG capsule Take 1 capsule (200 mg total) by mouth 3 (three) times daily as needed for cough. Swallow whole, do not chew 09/12/18   Ronan Duecker, PA-C  HYDROcodone-acetaminophen (NORCO/VICODIN) 5-325 MG tablet Take one tab po q 4 hrs prn pain 09/12/18   Devery Murgia, PA-C  predniSONE (DELTASONE) 10 MG tablet Take 6 tablets day one, 5 tablets day two, 4 tablets day three, 3 tablets day four, 2 tablets day five, then 1 tablet day six 09/12/18   Pauline Aus, PA-C    Family History Family History  Problem Relation Age of Onset  . Coronary artery disease Mother 1       MI  . Heart attack Mother   . Hypertension Mother   . Diabetes Mother   . Asthma Mother   . Coronary artery disease Father 27       MI  . Heart attack Father   . Hypertension Father   . Stroke Neg Hx     Social History Social History   Tobacco Use  . Smoking status: Former Smoker    Packs/day: 1.50    Years: 30.00    Pack years: 45.00    Types: Cigarettes  . Smokeless tobacco: Never Used  . Tobacco comment: she uses vapor 6mg  no tobacco  Substance Use Topics  . Alcohol use: Yes    Alcohol/week: 0.0 standard drinks    Comment:  occasionally  . Drug use: No     Allergies   Amoxicillin, Augmentin [amoxicillin-pot clavulanate], Benadryl [diphenhydramine], Losartan, and Penicillins   Review of Systems Review of Systems  Constitutional: Negative for appetite change, chills and fever.  HENT: Positive for congestion and rhinorrhea. Negative for sore throat and trouble swallowing.   Respiratory: Positive for cough, shortness of breath and wheezing. Negative for chest tightness.   Cardiovascular: Negative for chest pain.  Gastrointestinal: Negative for abdominal pain, diarrhea, nausea and vomiting.  Genitourinary: Negative for dysuria.  Musculoskeletal: Negative for arthralgias (Right shoulder pain), back pain, joint swelling and  neck pain.  Skin: Negative for rash.  Neurological: Negative for dizziness, syncope, weakness, numbness and headaches.  Hematological: Negative for adenopathy.  Psychiatric/Behavioral: Negative for confusion.     Physical Exam Updated Vital Signs BP 135/83 (BP Location: Left Arm)   Pulse (!) 103   Temp 98.7 F (37.1 C) (Oral)   Ht 5\' 7"  (1.702 m)   Wt 111.1 kg   LMP 09/05/2018   SpO2 94%   BMI 38.37 kg/m   Physical Exam Vitals signs and nursing note reviewed.  Constitutional:      General: She is not in acute distress.    Appearance: She is well-developed. She is not ill-appearing.  HENT:     Head: Normocephalic and atraumatic.     Right Ear: Tympanic membrane and ear canal normal.     Left Ear: Tympanic membrane and ear canal normal.     Mouth/Throat:     Mouth: Mucous membranes are moist.     Pharynx: Uvula midline. No oropharyngeal exudate.  Eyes:     Pupils: Pupils are equal, round, and reactive to light.  Neck:     Musculoskeletal: Full passive range of motion without pain, normal range of motion and neck supple.     Trachea: Phonation normal.  Cardiovascular:     Rate and Rhythm: Normal rate and regular rhythm.     Heart sounds: Normal heart sounds. No murmur.   Pulmonary:     Effort: Pulmonary effort is normal. No respiratory distress.     Breath sounds: No stridor. Wheezing present. No rales.     Comments: Scattered expiratory wheezes, mostly heard on the right.  No rhonchi or rales.  Patient is able to speak in complete sentences without respiratory distress. Chest:     Chest wall: No tenderness.  Abdominal:     General: There is no distension.     Palpations: Abdomen is soft.     Tenderness: There is no abdominal tenderness.  Musculoskeletal:        General: Tenderness and signs of injury present.     Comments: Tenderness to palpation of the anterior and lateral right shoulder.  Pain is reproduced with abduction.  No bony step-offs or deformities.  Right elbow and wrist are nontender.  Grip strengths are strong and symmetrical bilaterally.  Lymphadenopathy:     Cervical: No cervical adenopathy.  Skin:    General: Skin is warm.     Capillary Refill: Capillary refill takes less than 2 seconds.     Findings: No erythema.  Neurological:     General: No focal deficit present.     Mental Status: She is alert.     Sensory: No sensory deficit.     Motor: No weakness or abnormal muscle tone.     Coordination: Coordination normal.      ED Treatments / Results  Labs (all labs ordered are listed, but only abnormal results are displayed) Labs Reviewed  SARS CORONAVIRUS 2 (TAT 6-24 HRS)    EKG None  Radiology No results found.  Procedures Procedures (including critical care time)  Medications Ordered in ED Medications  albuterol (VENTOLIN HFA) 108 (90 Base) MCG/ACT inhaler 4 puff (4 puffs Inhalation Given 09/12/18 0954)  predniSONE (DELTASONE) tablet 60 mg (60 mg Oral Given 09/12/18 0954)     Initial Impression / Assessment and Plan / ED Course  I have reviewed the triage vital signs and the nursing notes.  Pertinent labs & imaging results that were available during my care of  the patient were reviewed by me and considered in my  medical decision making (see chart for details).        Patient with history of asthma.  Coughing and rhinorrhea without fever.  No respiratory distress noted.  Scattered expiratory wheezes on exam that were improved on recheck after albuterol MDI.  Patient given initial dose of prednisone here.  She appears appropriate for discharge home, agrees to treatment with steroid taper and to continue her albuterol treatments.  COVID test is pending.  Patient agrees to isolation precautions until the tests are resulted.  Strict return precautions discussed.    XR shoulder neg for bony injury.  Shoulder exam concerning for rotator cuff injury.  She also agrees to close orthopedic follow-up.    Final Clinical Impressions(s) / ED Diagnoses   Final diagnoses:  Cough  Acute pain of right shoulder  Fall, initial encounter    ED Discharge Orders         Ordered    predniSONE (DELTASONE) 10 MG tablet     09/12/18 1021    benzonatate (TESSALON) 200 MG capsule  3 times daily PRN     09/12/18 1021    HYDROcodone-acetaminophen (NORCO/VICODIN) 5-325 MG tablet     09/12/18 1021           Pauline Ausriplett, Amedeo Detweiler, PA-C 09/14/18 2207    Bethann BerkshireZammit, Joseph, MD 09/15/18 1054

## 2018-10-17 ENCOUNTER — Other Ambulatory Visit: Payer: Self-pay | Admitting: Family Medicine

## 2018-10-17 DIAGNOSIS — J4520 Mild intermittent asthma, uncomplicated: Secondary | ICD-10-CM

## 2018-10-17 NOTE — Telephone Encounter (Signed)
Requested medication (s) are due for refill today: yes  Requested medication (s) are on the active medication list: yes  Last refill:  05/04/2017  Future visit scheduled: No  Notes to clinic:  No PCP last ordered by Dr Pamella Pert states need OV    Requested Prescriptions  Pending Prescriptions Disp Refills   ipratropium-albuterol (DUONEB) 0.5-2.5 (3) MG/3ML SOLN [Pharmacy Med Name: Ipratropium-Albuterol 0.5-2.5 (3) MG/3ML Inhalation Solution] 360 mL 0    Sig: USE 1 VIAL IN NEBULIZER EVERY 4 HOURS AS NEEDED FOR  SHORTNESS  OF  BREATH  (PATIENT  NEEDS  OFFICE  VISIT)     There is no refill protocol information for this order

## 2018-11-01 ENCOUNTER — Other Ambulatory Visit: Payer: Self-pay

## 2018-11-01 ENCOUNTER — Encounter (HOSPITAL_COMMUNITY): Payer: Self-pay | Admitting: Emergency Medicine

## 2018-11-01 ENCOUNTER — Emergency Department (HOSPITAL_COMMUNITY)
Admission: EM | Admit: 2018-11-01 | Discharge: 2018-11-01 | Disposition: A | Discharge status: Home / Self Care | Payer: Self-pay | Attending: Emergency Medicine | Admitting: Emergency Medicine

## 2018-11-01 DIAGNOSIS — I10 Essential (primary) hypertension: Secondary | ICD-10-CM | POA: Insufficient documentation

## 2018-11-01 DIAGNOSIS — J45909 Unspecified asthma, uncomplicated: Secondary | ICD-10-CM | POA: Insufficient documentation

## 2018-11-01 DIAGNOSIS — Z7982 Long term (current) use of aspirin: Secondary | ICD-10-CM | POA: Insufficient documentation

## 2018-11-01 DIAGNOSIS — L0201 Cutaneous abscess of face: Secondary | ICD-10-CM | POA: Insufficient documentation

## 2018-11-01 DIAGNOSIS — E119 Type 2 diabetes mellitus without complications: Secondary | ICD-10-CM | POA: Insufficient documentation

## 2018-11-01 DIAGNOSIS — H04302 Unspecified dacryocystitis of left lacrimal passage: Secondary | ICD-10-CM

## 2018-11-01 DIAGNOSIS — I259 Chronic ischemic heart disease, unspecified: Secondary | ICD-10-CM | POA: Insufficient documentation

## 2018-11-01 DIAGNOSIS — H04552 Acquired stenosis of left nasolacrimal duct: Secondary | ICD-10-CM | POA: Insufficient documentation

## 2018-11-01 DIAGNOSIS — Z87891 Personal history of nicotine dependence: Secondary | ICD-10-CM | POA: Insufficient documentation

## 2018-11-01 MED ORDER — POVIDONE-IODINE 10 % EX SOLN
CUTANEOUS | Status: DC | PRN
  Administered 2018-11-01: 1 via TOPICAL
  Filled 2018-11-01: qty 15

## 2018-11-01 MED ORDER — LIDOCAINE HCL (PF) 1 % IJ SOLN: 2.0000 mL | Freq: Once | INTRAMUSCULAR | Status: DC

## 2018-11-01 MED ORDER — SULFAMETHOXAZOLE-TRIMETHOPRIM 800-160 MG PO TABS
1.0000 | ORAL_TABLET | Freq: Once | ORAL | Status: AC
  Administered 2018-11-01: 19:00:00 1 via ORAL
  Filled 2018-11-01: qty 1

## 2018-11-01 MED ORDER — ERYTHROMYCIN 5 MG/GM OP OINT
TOPICAL_OINTMENT | Freq: Once | OPHTHALMIC | Status: AC
  Administered 2018-11-01: 1 via OPHTHALMIC
  Filled 2018-11-01: qty 3.5

## 2018-11-01 MED ORDER — SULFAMETHOXAZOLE-TRIMETHOPRIM 800-160 MG PO TABS: 1.0000 | ORAL_TABLET | Freq: Two times a day (BID) | ORAL | 0 refills | Status: AC

## 2018-11-01 MED ORDER — LIDOCAINE HCL (PF) 1 % IJ SOLN: 5.0000 mL | Freq: Once | INTRAMUSCULAR | Status: DC

## 2018-11-01 MED ORDER — LIDOCAINE HCL (PF) 1 % IJ SOLN
INTRAMUSCULAR | Status: AC
  Filled 2018-11-01: qty 30

## 2018-11-01 MED ORDER — PENTAFLUOROPROP-TETRAFLUOROETH EX AERO
INHALATION_SPRAY | CUTANEOUS | Status: DC | PRN
  Administered 2018-11-01: 1 via TOPICAL
  Filled 2018-11-01: qty 116

## 2018-11-01 NOTE — ED Notes (Signed)
ED Provider at bedside. 

## 2018-11-01 NOTE — ED Triage Notes (Signed)
Patient reports abscess to her L check that started over a week ago. Patient now has swelling and pain to her L eye.

## 2018-11-01 NOTE — Discharge Instructions (Signed)
Take the entire course of the antibiotics prescribed.  Apply the antibiotic ointment to your left eye and lower eyelid area three times daily.  Gentle massage and warm soaks at both sites, eyelid and the facial abscess several times daily may help these areas heal quicker. Return here for any worsened symptoms - it may take up to 2 days before the antibiotics start to make a significant reduction in the swelling and infection.

## 2018-11-01 NOTE — ED Provider Notes (Signed)
[facility] EMERGENCY DEPARTMENT Provider Note   CSN: [id number] Arrival date & time: 11/01/18  1517     History   Chief Complaint Chief Complaint  Patient presents with  . Abscess    HPI Kristen Price is a 43 y.o. female.     The history is provided by the patient.  Abscess Location:  Face Facial abscess location: left jawline, now also swelling along her left medial lower eyelid. Size:  1 cm, pointing, no drainage.  Also no sign of infection or abscess between the jawline and the eyelid Abscess quality: induration, painful, redness and warmth   Abscess quality: not draining   Red streaking: no   Duration:  1 week Progression:  Worsening Pain details:    Quality:  Pressure and throbbing   Severity:  Moderate   Timing:  Constant   Progression:  Worsening Chronicity:  New Context: diabetes   Relieved by:  Nothing Worsened by:  Nothing Ineffective treatments:  Warm compresses and oral antibiotics (took 2 leftover PCN tablets without improvement) Associated symptoms: no fever, no headaches, no nausea and no vomiting   Risk factors: no hx of MRSA and no prior abscess     Past Medical History:  Diagnosis Date  . Anxiety   . Asthma   . Chronic lower back pain   . Depression   . Diabetes (HCC) 2019  . Family history of adverse reaction to anesthesia    "mom didn't want to wake up at all"  . GERD (gastroesophageal reflux disease)   . Hiatal hernia   . Hypertension   . NSTEMI (non-ST elevated myocardial infarction) (HCC) 06/06/2014   stent to circumfkex  . Obesity     Patient Active Problem List   Diagnosis Date Noted  . Diabetes mellitus (HCC) 02/22/2018  . Obesity (BMI 35.0-39.9 without comorbidity) 02/22/2018  . Mild intermittent asthma without complication 03/06/2017  . Conductive hearing loss of left ear 08/19/2015  . Gallbladder disease 03/03/2015  . Coronary artery disease involving native coronary artery of native heart without angina pectoris  06/24/2014  . Elevated troponin   . History of non-ST elevation myocardial infarction (NSTEMI) 06/06/2014  . Tobacco abuse 06/06/2014  . Chronic low back pain 10/25/2011  . GAD (generalized anxiety disorder) 09/27/2011  . Insomnia 09/27/2011    Past Surgical History:  Procedure Laterality Date  . CARDIAC CATHETERIZATION N/A 06/07/2014   Procedure: Left Heart Cath and Coronary Angiography;  Surgeon: Lennette Bihari, MD;  Location: [facility] INVASIVE CV LAB;  Service: Cardiovascular;  Laterality: N/A;  . CHOLECYSTECTOMY  2019  . FEMUR FRACTURE SURGERY Left 2003   "metal rod runs from hip to knee"  . FRACTURE SURGERY    . HOODED EYE SURGERY       OB History    Gravida  1   Para  1   Term  1   Preterm      AB      Living        SAB      TAB      Ectopic      Multiple      Live Births               Home Medications    Prior to Admission medications   Medication Sig Start Date End Date Taking? Authorizing Provider  albuterol (PROVENTIL HFA;VENTOLIN HFA) 108 (90 Base) MCG/ACT inhaler Inhale 2 puffs into the lungs every 4 (four) hours as needed for wheezing or shortness  of breath (cough, shortness of breath or wheezing.). Patient needs office visit 05/04/17   Myles Lipps, MD  alprazolam Prudy Feeler) 2 MG tablet Take 0.5 tablets (1 mg total) by mouth 2 (two) times daily as needed for anxiety (essential tremors). Patient taking differently: Take 0.25 mg by mouth 2 (two) times daily as needed for anxiety (essential tremors).  05/11/17   Myles Lipps, MD  aspirin EC 81 MG EC tablet Take 1 tablet (81 mg total) by mouth daily. 06/08/14   Dwana Melena, PA-C  atorvastatin (LIPITOR) 80 MG tablet Take 1 tablet (80 mg total) by mouth daily. 02/22/18   Dyann Kief, PA-C  benzonatate (TESSALON) 200 MG capsule Take 1 capsule (200 mg total) by mouth 3 (three) times daily as needed for cough. Swallow whole, do not chew 09/12/18   Triplett, Tammy, PA-C  HYDROcodone-acetaminophen  (NORCO/VICODIN) 5-325 MG tablet Take one tab po q 4 hrs prn pain 09/12/18   Triplett, Tammy, PA-C  ipratropium-albuterol (DUONEB) 0.5-2.5 (3) MG/3ML SOLN Take 3 mLs by nebulization every 4 (four) hours as needed (shortness of breath). Patient needs office visit 05/04/17   Myles Lipps, MD  montelukast (SINGULAIR) 10 MG tablet Take 1 tablet (10 mg total) by mouth at bedtime. Patient needs office visit 05/04/17   Myles Lipps, MD  nicotine (NICODERM CQ) 21 mg/24hr patch Apply  patch one daily for 6 weeks (dispense #42), then change to  patch one daily for 2 weeks (dispense #14), then change to  patch one daily for 2 weeks (dispense #14). Remove old patch before applying new one. 02/22/18   Dyann Kief, PA-C  nitroGLYCERIN (NITROSTAT) 0.4 MG SL tablet Place 1 tablet (0.4 mg total) under the tongue every 5 (five) minutes x 3 doses as needed for chest pain. 02/22/18   Dyann Kief, PA-C  pantoprazole (PROTONIX) 40 MG tablet Take 1 tablet (40 mg total) by mouth 2 (two) times daily. Patient needs office visit 05/04/17   Myles Lipps, MD  predniSONE (DELTASONE) 10 MG tablet Take 6 tablets day one, 5 tablets day two, 4 tablets day three, 3 tablets day four, 2 tablets day five, then 1 tablet day six 09/12/18   Triplett, Tammy, PA-C  sulfamethoxazole-trimethoprim (BACTRIM DS) 800-160 MG tablet Take 1 tablet by mouth 2 (two) times daily for 10 days. 11/01/18 11/11/18  Burgess Amor, PA-C    Family History Family History  Problem Relation Age of Onset  . Coronary artery disease Mother 38       MI  . Heart attack Mother   . Hypertension Mother   . Diabetes Mother   . Asthma Mother   . Coronary artery disease Father 69       MI  . Heart attack Father   . Hypertension Father   . Stroke Neg Hx     Social History Social History   Tobacco Use  . Smoking status: Former Smoker    Packs/day: 1.50    Years: 30.00    Pack years: 45.00    Types: Cigarettes  . Smokeless tobacco: Never Used   . Tobacco comment: she uses vapor  no tobacco  Substance Use Topics  . Alcohol use: Yes    Alcohol/week: 0.0 standard drinks    Comment: occasionally  . Drug use: No     Allergies   Amoxicillin, Augmentin [amoxicillin-pot clavulanate], Benadryl [diphenhydramine], Losartan, and Penicillins   Review of Systems Review of Systems  Constitutional: Negative for fever.  HENT: Positive for facial swelling. Negative for congestion and sore throat.   Eyes: Negative.  Negative for pain.  Respiratory: Negative for chest tightness and shortness of breath.   Cardiovascular: Negative for chest pain.  Gastrointestinal: Negative for abdominal pain, nausea and vomiting.  Genitourinary: Negative.   Musculoskeletal: Negative for arthralgias, joint swelling and neck pain.  Skin: Negative.  Negative for rash and wound.  Neurological: Negative for dizziness, weakness, light-headedness, numbness and headaches.  Psychiatric/Behavioral: Negative.      Physical Exam Updated Vital Signs BP 136/82 (BP Location: Right Arm)   Pulse 82   Temp 98.2 F (36.8 C) (Oral)   Resp 15   Ht 5\' 7"  (1.702 m)   Wt 100.2 kg   LMP 10/02/2018 Comment: irregular  SpO2 98%   BMI 34.60 kg/m   Physical Exam Vitals signs and nursing note reviewed.  Constitutional:      Appearance: She is well-developed.  HENT:     Head: Normocephalic and atraumatic.      Comments: 1 cm erythematous raised abscess, tenting without drainage. Eyes:     Conjunctiva/sclera: Conjunctivae normal.   Neck:     Musculoskeletal: Normal range of motion.  Cardiovascular:     Rate and Rhythm: Normal rate and regular rhythm.     Heart sounds: Normal heart sounds.  Pulmonary:     Effort: Pulmonary effort is normal.     Breath sounds: Normal breath sounds. No wheezing.  Abdominal:     General: Bowel sounds are normal.     Palpations: Abdomen is soft.     Tenderness: There is no abdominal tenderness.  Musculoskeletal: Normal range of  motion.  Skin:    General: Skin is warm and dry.  Neurological:     Mental Status: She is alert.      ED Treatments / Results  Labs (all labs ordered are listed, but only abnormal results are displayed) Labs Reviewed - No data to display  EKG None  Radiology No results found.  Procedures Procedures (including critical care time)  INCISION AND DRAINAGE Performed by: Evalee Jefferson Consent: Verbal consent obtained. Risks and benefits: risks, benefits and alternatives were discussed Type: abscess  Body area: left cheek/jawline  Anesthesia: local infiltration  Incision was made with a scalpel.  Local anesthetic: lidocaine 1% without epinephrine  Anesthetic total: 2 ml, immediately preceeded by topical freeze spray  Complexity: complex Blunt dissection to break up loculations  Drainage: purulent  Drainage amount: small  Packing material: none  Patient tolerance: Patient tolerated the procedure well with no immediate complications.     Medications Ordered in ED Medications  povidone-iodine (BETADINE) 10 % external solution (has no administration in time range)  pentafluoroprop-tetrafluoroeth (GEBAUERS) aerosol (has no administration in time range)  lidocaine (PF) (XYLOCAINE) 1 % injection 5 mL (has no administration in time range)  lidocaine (PF) (XYLOCAINE) 1 % injection (has no administration in time range)  sulfamethoxazole-trimethoprim (BACTRIM DS) 800-160 MG per tablet 1 tablet (has no administration in time range)  erythromycin ophthalmic ointment (has no administration in time range)     Initial Impression / Assessment and Plan / ED Course  I have reviewed the triage vital signs and the nursing notes.  Pertinent labs & imaging results that were available during my care of the patient were reviewed by me and considered in my medical decision making (see chart for details).        Pt with facial abscess, isolated lacrimal duct edema/inflammation,  suspect infection but  no purulent drainage or induration.  Pt placed on bactrim, also given erythromycin ophthalmic ointment to treat the eye structures.  Warm soaks with gentle massage.  Strict return precautions if pt has any worsening sx after 2 days of abx use.    Final Clinical Impressions(s) / ED Diagnoses   Final diagnoses:  Facial abscess  Lacrimal duct infection, left    ED Discharge Orders         Ordered    sulfamethoxazole-trimethoprim (BACTRIM DS) 800-160 MG tablet  2 times daily     11/01/18 1820           Burgess Amordol, Nariya Neumeyer, Cordelia Poche-C 11/01/18 Natividad [id number]    Pickering, Nathan, MD 11/01/18 2330

## 2018-12-15 ENCOUNTER — Other Ambulatory Visit: Payer: Self-pay

## 2018-12-15 ENCOUNTER — Encounter (HOSPITAL_COMMUNITY): Payer: Self-pay | Admitting: *Deleted

## 2018-12-15 ENCOUNTER — Emergency Department (HOSPITAL_COMMUNITY): Payer: Self-pay

## 2018-12-15 ENCOUNTER — Emergency Department (HOSPITAL_COMMUNITY)
Admission: EM | Admit: 2018-12-15 | Discharge: 2018-12-15 | Disposition: A | Discharge status: Home / Self Care | Payer: Self-pay | Attending: Emergency Medicine | Admitting: Emergency Medicine

## 2018-12-15 DIAGNOSIS — I2510 Atherosclerotic heart disease of native coronary artery without angina pectoris: Secondary | ICD-10-CM | POA: Insufficient documentation

## 2018-12-15 DIAGNOSIS — Z87891 Personal history of nicotine dependence: Secondary | ICD-10-CM | POA: Insufficient documentation

## 2018-12-15 DIAGNOSIS — Z79899 Other long term (current) drug therapy: Secondary | ICD-10-CM | POA: Insufficient documentation

## 2018-12-15 DIAGNOSIS — R059 Cough, unspecified: Secondary | ICD-10-CM

## 2018-12-15 DIAGNOSIS — Z7982 Long term (current) use of aspirin: Secondary | ICD-10-CM | POA: Insufficient documentation

## 2018-12-15 DIAGNOSIS — R05 Cough: Secondary | ICD-10-CM | POA: Insufficient documentation

## 2018-12-15 DIAGNOSIS — J4520 Mild intermittent asthma, uncomplicated: Secondary | ICD-10-CM | POA: Insufficient documentation

## 2018-12-15 DIAGNOSIS — I10 Essential (primary) hypertension: Secondary | ICD-10-CM | POA: Insufficient documentation

## 2018-12-15 DIAGNOSIS — E119 Type 2 diabetes mellitus without complications: Secondary | ICD-10-CM | POA: Insufficient documentation

## 2018-12-15 MED ORDER — MEDROXYPROGESTERONE ACETATE 5 MG PO TABS: 5.0000 mg | ORAL_TABLET | Freq: Every day | ORAL | 0 refills | Status: DC

## 2018-12-15 MED ORDER — ALBUTEROL SULFATE HFA 108 (90 BASE) MCG/ACT IN AERS
2.0000 | INHALATION_SPRAY | Freq: Once | RESPIRATORY_TRACT | Status: AC
  Administered 2018-12-15: 15:00:00 2 via RESPIRATORY_TRACT
  Filled 2018-12-15: qty 6.7

## 2018-12-15 MED ORDER — IPRATROPIUM-ALBUTEROL 0.5-2.5 (3) MG/3ML IN SOLN: 3.0000 mL | RESPIRATORY_TRACT | 0 refills | Status: DC | PRN

## 2018-12-15 MED ORDER — PREDNISONE 20 MG PO TABS: 40.0000 mg | ORAL_TABLET | Freq: Every day | ORAL | 0 refills | Status: DC

## 2018-12-15 MED ORDER — BENZONATATE 200 MG PO CAPS: 200.0000 mg | ORAL_CAPSULE | Freq: Three times a day (TID) | ORAL | 0 refills | Status: DC | PRN

## 2018-12-15 MED ORDER — DEXAMETHASONE SODIUM PHOSPHATE 10 MG/ML IJ SOLN
10.0000 mg | Freq: Once | INTRAMUSCULAR | Status: AC
  Administered 2018-12-15: 15:00:00 10 mg via INTRAMUSCULAR
  Filled 2018-12-15: qty 1

## 2018-12-15 NOTE — ED Provider Notes (Signed)
[facility] EMERGENCY DEPARTMENT Provider Note   CSN: [id number] Arrival date & time: 12/15/18  1347     History Chief Complaint  Patient presents with  . Cough    Kristen Price is a 43 y.o. female.  HPI     Patient presents with concern of cough, wheezing. Patient has a history of asthma, reactive airway disease.  She notes that over the past 3 months or so she has had multiple episodes of waxing, waning coughing, wheezing. Now, over the past 2 days she has had more severe coughing and wheezing than prior. No fever, no abdominal pain, no syncope, no vomiting, diarrhea. Patient notes that she recently has a new job, does not have [employer], and has not seen a physician recently. She notes that over the past few days in spite of using albuterol frequently she has had worsening condition.   Past Medical History:  Diagnosis Date  . Anxiety   . Asthma   . Chronic lower back pain   . Depression   . Diabetes (HCC) 2019  . Family history of adverse reaction to anesthesia    "mom didn't want to wake up at all"  . GERD (gastroesophageal reflux disease)   . Hiatal hernia   . Hypertension   . NSTEMI (non-ST elevated myocardial infarction) (HCC) 06/06/2014   stent to circumfkex  . Obesity     Patient Active Problem List   Diagnosis Date Noted  . Diabetes mellitus (HCC) 02/22/2018  . Obesity (BMI 35.0-39.9 without comorbidity) 02/22/2018  . Mild intermittent asthma without complication 03/06/2017  . Conductive hearing loss of left ear 08/19/2015  . Gallbladder disease 03/03/2015  . Coronary artery disease involving native coronary artery of native heart without angina pectoris 06/24/2014  . Elevated troponin   . History of non-ST elevation myocardial infarction (NSTEMI) 06/06/2014  . Tobacco abuse 06/06/2014  . Chronic low back pain 10/25/2011  . GAD (generalized anxiety disorder) 09/27/2011  . Insomnia 09/27/2011    Past Surgical History:  Procedure  Laterality Date  . CARDIAC CATHETERIZATION N/A 06/07/2014   Procedure: Left Heart Cath and Coronary Angiography;  Surgeon: Lennette Bihari, MD;  Location: [facility] INVASIVE CV LAB;  Service: Cardiovascular;  Laterality: N/A;  . CHOLECYSTECTOMY  2019  . FEMUR FRACTURE SURGERY Left 2003   "metal rod runs from hip to knee"  . FRACTURE SURGERY    . HOODED EYE SURGERY       OB History    Gravida  1   Para  1   Term  1   Preterm      AB      Living        SAB      TAB      Ectopic      Multiple      Live Births              Family History  Problem Relation Age of Onset  . Coronary artery disease Mother 79       MI  . Heart attack Mother   . Hypertension Mother   . Diabetes Mother   . Asthma Mother   . Coronary artery disease Father 85       MI  . Heart attack Father   . Hypertension Father   . Stroke Neg Hx     Social History   Tobacco Use  . Smoking status: Former Smoker    Packs/day: 1.50    Years: 30.00  Pack years: 45.00    Types: Cigarettes  . Smokeless tobacco: Never Used  . Tobacco comment: she uses vapor 6mg  no tobacco  Substance Use Topics  . Alcohol use: Yes    Alcohol/week: 0.0 standard drinks    Comment: occasionally  . Drug use: No    Home Medications Prior to Admission medications   Medication Sig Start Date End Date Taking? Authorizing Provider  albuterol (PROVENTIL HFA;VENTOLIN HFA) 108 (90 Base) MCG/ACT inhaler Inhale 2 puffs into the lungs every 4 (four) hours as needed for wheezing or shortness of breath (cough, shortness of breath or wheezing.). Patient needs office visit 05/04/17   07/04/17, MD  alprazolam Myles Lipps) 2 MG tablet Take 0.5 tablets (1 mg total) by mouth 2 (two) times daily as needed for anxiety (essential tremors). Patient taking differently: Take 0.25 mg by mouth 2 (two) times daily as needed for anxiety (essential tremors).  05/11/17   07/11/17, MD  aspirin EC 81 MG EC tablet Take 1 tablet (81 mg total) by  mouth daily. 06/08/14   08/08/14, PA-C  atorvastatin (LIPITOR) 80 MG tablet Take 1 tablet (80 mg total) by mouth daily. 02/22/18   02/24/18, PA-C  benzonatate (TESSALON) 200 MG capsule Take 1 capsule (200 mg total) by mouth 3 (three) times daily as needed for cough. Swallow whole, do not chew 12/15/18   14/11/20, MD  HYDROcodone-acetaminophen (NORCO/VICODIN) 5-325 MG tablet Take one tab po q 4 hrs prn pain 09/12/18   Triplett, Tammy, PA-C  ipratropium-albuterol (DUONEB) 0.5-2.5 (3) MG/3ML SOLN Take 3 mLs by nebulization every 4 (four) hours as needed (shortness of breath). Patient needs office visit 12/15/18   14/11/20, MD  medroxyPROGESTERone (PROVERA) 5 MG tablet Take 1 tablet (5 mg total) by mouth daily. 12/15/18   14/11/20, MD  montelukast (SINGULAIR) 10 MG tablet Take 1 tablet (10 mg total) by mouth at bedtime. Patient needs office visit 05/04/17   07/04/17, MD  nicotine (NICODERM CQ) 21 mg/24hr patch Apply 21mg  patch one daily for 6 weeks (dispense #42), then change to 14mg  patch one daily for 2 weeks (dispense #14), then change to 7mg  patch one daily for 2 weeks (dispense #14). Remove old patch before applying new one. 02/22/18   , PA-C  nitroGLYCERIN (NITROSTAT) 0.4 MG SL tablet Place 1 tablet (0.4 mg total) under the tongue every 5 (five) minutes x 3 doses as needed for chest pain. 02/22/18   , PA-C  pantoprazole (PROTONIX) 40 MG tablet Take 1 tablet (40 mg total) by mouth 2 (two) times daily. Patient needs office visit 05/04/17   Dyann Kief, MD  predniSONE (DELTASONE) 20 MG tablet Take 2 tablets (40 mg total) by mouth daily with breakfast. For the next four days 12/15/18   Dyann Kief, MD    Allergies    Amoxicillin, Augmentin [amoxicillin-pot clavulanate], Benadryl [diphenhydramine], Losartan, and Penicillins  Review of Systems   Review of Systems  Constitutional:       Per HPI, otherwise negative  HENT:        Per HPI, otherwise negative  Respiratory:       Per HPI, otherwise negative  Cardiovascular:       Per HPI, otherwise negative  Gastrointestinal: Negative for vomiting.  Endocrine:       Negative aside from HPI  Genitourinary:       Vaginal bleeding consistent with episodes that have occurred since removal  of her implanted contraceptive device, and the patient has no current contraceptives.  Musculoskeletal:       Per HPI, otherwise negative  Skin: Negative.   Neurological: Negative for syncope and weakness.    Physical Exam Updated Vital Signs BP 137/68 (BP Location: Right Arm)   Pulse 98   Temp 98.1 F (36.7 C) (Oral)   Resp 20   Ht 5\' 7"  (1.702 m)   Wt 100 kg   LMP 12/05/2018   SpO2 96%   BMI 34.53 kg/m   Physical Exam Vitals and nursing note reviewed.  Constitutional:      General: She is not in acute distress.    Appearance: She is well-developed.  HENT:     Head: Normocephalic and atraumatic.  Eyes:     Conjunctiva/sclera: Conjunctivae normal.  Cardiovascular:     Rate and Rhythm: Normal rate and regular rhythm.  Pulmonary:     Effort: No respiratory distress.     Breath sounds: Decreased air movement present. No stridor. Wheezing present.  Abdominal:     General: There is no distension.  Skin:    General: Skin is warm and dry.  Neurological:     Mental Status: She is alert and oriented to person, place, and time.     Cranial Nerves: No cranial nerve deficit.     ED Results / Procedures / Treatments    Radiology DG Chest Port 1 View  Result Date: 12/15/2018 CLINICAL DATA:  Cough.  Wheezing and shortness of breath. EXAM: PORTABLE CHEST 1 VIEW COMPARISON:  09/12/2018 FINDINGS: Cardiac silhouette is normal in size. Normal mediastinal and hilar contours. Lungs are clear.  No pleural effusion or pneumothorax. Skeletal structures are grossly intact. IMPRESSION: No active disease. Electronically Signed   By: Lajean Manes M.D.   On: 12/15/2018 14:41     Procedures Procedures (including critical care time)  Medications Ordered in ED Medications  dexamethasone (DECADRON) injection 10 mg (has no administration in time range)  albuterol (VENTOLIN HFA) 108 (90 Base) MCG/ACT inhaler 2 puff (has no administration in time range)    ED Course  I have reviewed the triage vital signs and the nursing notes.  Pertinent labs & imaging results that were available during my care of the patient were reviewed by me and considered in my medical decision making (see chart for details).    MDM Rules/Calculators/A&P     CHA2DS2/VAS Stroke Risk Points      N/A >= 2 Points: High Risk  1 - 1.99 Points: Medium Risk  0 Points: Low Risk    A final score could not be computed because of missing components.: Last  Change: N/A     This score determines the patient's risk of having a stroke if the  patient has atrial fibrillation.      This score is not applicable to this patient. Components are not  calculated.                   Patient is a smoker, we discussed, at length, the implications of her smoking for her ongoing difficulty with breathing, persistent wheezing, cough. This 43 year old female with a history of reactive airway disease presents with cough, wheezing.  She is awake, alert, afebrile, speaking clearly, but has diminished breath sounds and wheezing. Initially the patient defers x-ray, but findings are reassuring as well. No evidence for pneumonia, decompensated state, patient's meds were refilled, she was started on steroids, ipratropium, albuterol.  On per her request she  also received a refill of Tessalon and oral contraceptive pill Patient appropriate for discharge with outpatient follow-up.  On she was provided information for our affiliated clinic.  Final Clinical Impression(s) / ED Diagnoses Final diagnoses:  Cough    Rx / DC Orders ED Discharge Orders         Ordered    ipratropium-albuterol (DUONEB) 0.5-2.5 (3) MG/3ML SOLN   Every 4 hours PRN     12/15/18 1425    benzonatate (TESSALON) 200 MG capsule  3 times daily PRN     12/15/18 1425    predniSONE (DELTASONE) 20 MG tablet  Daily with breakfast     12/15/18 1425    medroxyPROGESTERone (PROVERA) 5 MG tablet  Daily     12/15/18 1425           Gerhard MunchLockwood, Christain Mcraney, MD 12/15/18 1449

## 2018-12-15 NOTE — Discharge Instructions (Addendum)
As discussed, it is very important that you complete the course of prednisone as prescribed, take your medication as directed including your albuterol and ipratropium.  Please be sure to schedule follow-up visit with our affiliated clinic.  Do not hesitate to return here if you develop new, or concerning changes in your condition.

## 2018-12-15 NOTE — ED Triage Notes (Signed)
Pt c/o wheezing, sob and cough; pt states th cough is non-productive

## 2018-12-21 ENCOUNTER — Other Ambulatory Visit: Payer: Self-pay

## 2018-12-21 ENCOUNTER — Ambulatory Visit
Admission: EM | Admit: 2018-12-21 | Discharge: 2018-12-21 | Disposition: A | Discharge status: Home / Self Care | Payer: Self-pay | Attending: Emergency Medicine | Admitting: Emergency Medicine

## 2018-12-21 DIAGNOSIS — R05 Cough: Secondary | ICD-10-CM

## 2018-12-21 DIAGNOSIS — R053 Chronic cough: Secondary | ICD-10-CM

## 2018-12-21 DIAGNOSIS — R062 Wheezing: Secondary | ICD-10-CM

## 2018-12-21 DIAGNOSIS — J4521 Mild intermittent asthma with (acute) exacerbation: Secondary | ICD-10-CM

## 2018-12-21 DIAGNOSIS — R0981 Nasal congestion: Secondary | ICD-10-CM

## 2018-12-21 DIAGNOSIS — Z20828 Contact with and (suspected) exposure to other viral communicable diseases: Secondary | ICD-10-CM

## 2018-12-21 DIAGNOSIS — J0100 Acute maxillary sinusitis, unspecified: Secondary | ICD-10-CM

## 2018-12-21 DIAGNOSIS — Z20822 Contact with and (suspected) exposure to covid-19: Secondary | ICD-10-CM

## 2018-12-21 MED ORDER — DOXYCYCLINE HYCLATE 100 MG PO CAPS: 100.0000 mg | ORAL_CAPSULE | Freq: Two times a day (BID) | ORAL | 0 refills | Status: DC

## 2018-12-21 MED ORDER — ONDANSETRON HCL 4 MG PO TABS: 4.0000 mg | ORAL_TABLET | Freq: Four times a day (QID) | ORAL | 0 refills | Status: DC

## 2018-12-21 MED ORDER — PREDNISONE 10 MG (21) PO TBPK: ORAL_TABLET | Freq: Every day | ORAL | 0 refills | Status: DC

## 2018-12-21 MED ORDER — FLUCONAZOLE 200 MG PO TABS: ORAL_TABLET | ORAL | 0 refills | Status: DC

## 2018-12-21 NOTE — Discharge Instructions (Addendum)
COVID testing ordered.  It will take between 5-7 days for test results.  Someone will contact you regarding abnormal results.    In the meantime: You should remain isolated in your home for 10 days from symptom onset AND greater than 72 hours after symptoms resolution (absence of fever without the use of fever-reducing medication and improvement in respiratory symptoms), whichever is longer Get plenty of rest and push fluids Doxycycline prescribed for possible sinus infection Prednisone taper for asthma flare Diflucan for possible yeast infection Use OTC zyrtec for nasal congestion, runny nose, and/or sore throat Use OTC flonase for nasal congestion and runny nose Use medications daily for symptom relief Use OTC medications like ibuprofen or tylenol as needed fever or pain Call or go to the ED if you have any new or worsening symptoms such as fever, worsening cough, shortness of breath, chest tightness, chest pain, turning blue, changes in mental status, etc..Marland Kitchen

## 2018-12-21 NOTE — ED Triage Notes (Signed)
Pt presents to UC w/ c/o cough x3 months. Pt also has congestion x4 days. Pt has been nauseous and dizzy x4 days, since taking prednisone. Pt states cough has gotten better since taking predisone but has developed congestion.

## 2018-12-21 NOTE — ED Provider Notes (Signed)
[place]   [id number] 12/21/18 Arrival Time: 1203   CC: Cough and congestion  SUBJECTIVE: History from: patient.  Kristen Price is a 43 y.o. female who presents with sinus pain/ pressure, and nasal congestion with thick white mucus x 4 days.  Denies sick exposure to COVID, flu or strep.  Denies recent travel.  Reports chronic nonproductive cough and wheezing x 3 months.  Was seen the ED on 12/15/18 for chronic cough.  Had negative CXR.  Prescribed prednisone, tesslon perles, and duoneb.  Reports improvement in cough.  Mentions nausea associated with nasal congestion as well as left ear discomfort and PND.  Also reports a few episodes of diarrhea.  Denies fever, chills, rhinorrhea, sore throat, SOB, chest pain, nausea, changes in bladder habits.    ROS: As per HPI.  All other pertinent ROS negative.     Past Medical History:  Diagnosis Date  . Anxiety   . Asthma   . Chronic lower back pain   . Depression   . Diabetes ([place]) 2019  . Family history of adverse reaction to anesthesia    "mom didn't want to wake up at all"  . GERD (gastroesophageal reflux disease)   . Hiatal hernia   . Hypertension   . NSTEMI (non-ST elevated myocardial infarction) ([place]) 06/06/2014   stent to circumfkex  . Obesity    Past Surgical History:  Procedure Laterality Date  . CARDIAC CATHETERIZATION N/A 06/07/2014   Procedure: Left Heart Cath and Coronary Angiography;  Surgeon: Troy Sine, MD;  Location: [place] CV LAB;  Service: Cardiovascular;  Laterality: N/A;  . CHOLECYSTECTOMY  2019  . FEMUR FRACTURE SURGERY Left 2003   "metal rod runs from hip to knee"  . FRACTURE SURGERY    . HOODED EYE SURGERY     Allergies  Allergen Reactions  . Amoxicillin Nausea And Vomiting    Did it involve swelling of the face/tongue/throat, SOB, or low BP? No Did it involve sudden or severe rash/hives, skin peeling, or any reaction on the inside of your mouth or nose? No Did you need to seek medical  attention at a hospital or doctor's office? No When did it last happen? If all above answers are "NO", may proceed with cephalosporin use.   . Augmentin [Amoxicillin-Pot Clavulanate] Nausea And Vomiting  . Benadryl [Diphenhydramine] Other (See Comments)    Patient stated that it makes her "go nuts"  . Losartan Cough  . Penicillins Nausea And Vomiting   No current facility-administered medications on file prior to encounter.   Current Outpatient Medications on File Prior to Encounter  Medication Sig Dispense Refill  . albuterol (PROVENTIL HFA;VENTOLIN HFA) 108 (90 Base) MCG/ACT inhaler Inhale 2 puffs into the lungs every 4 (four) hours as needed for wheezing or shortness of breath (cough, shortness of breath or wheezing.). Patient needs office visit 1 Inhaler 0  . alprazolam (XANAX) 2 MG tablet Take 0.5 tablets (1 mg total) by mouth 2 (two) times daily as needed for anxiety (essential tremors). (Patient taking differently: Take 0.25 mg by mouth 2 (two) times daily as needed for anxiety (essential tremors). ) 30 tablet 2  . aspirin EC 81 MG EC tablet Take 1 tablet (81 mg total) by mouth daily.    Marland Kitchen atorvastatin (LIPITOR) 80 MG tablet Take 1 tablet (80 mg total) by mouth daily. 90 tablet 3  . benzonatate (TESSALON) 200 MG capsule Take 1 capsule (200 mg total) by mouth 3 (three) times daily as needed for  cough. Swallow whole, do not chew 21 capsule 0  . HYDROcodone-acetaminophen (NORCO/VICODIN) 5-325 MG tablet Take one tab po q 4 hrs prn pain 8 tablet 0  . ipratropium-albuterol (DUONEB) 0.5-2.5 (3) MG/3ML SOLN Take 3 mLs by nebulization every 4 (four) hours as needed (shortness of breath). Patient needs office visit 360 mL 0  . medroxyPROGESTERone (PROVERA) 5 MG tablet Take 1 tablet (5 mg total) by mouth daily. 5 tablet 0  . montelukast (SINGULAIR) 10 MG tablet Take 1 tablet (10 mg total) by mouth at bedtime. Patient needs office visit 30 tablet 0  . nicotine (NICODERM CQ) 21 mg/24hr patch  Apply  patch one daily for 6 weeks (dispense #42), then change to  patch one daily for 2 weeks (dispense #14), then change to  patch one daily for 2 weeks (dispense #14). Remove old patch before applying new one. 70 patch 0  . nitroGLYCERIN (NITROSTAT) 0.4 MG SL tablet Place 1 tablet (0.4 mg total) under the tongue every 5 (five) minutes x 3 doses as needed for chest pain. 25 tablet 12  . pantoprazole (PROTONIX) 40 MG tablet Take 1 tablet (40 mg total) by mouth 2 (two) times daily. Patient needs office visit 60 tablet 0   Social History   Socioeconomic History  . Marital status: Divorced    Spouse name: Not on file  . Number of children: 1  . Years of education: Not on file  . Highest education level: Not on file  Occupational History  . Occupation: Works from home for [employer]  . Smoking status: Former Smoker    Packs/day: 1.50    Years: 30.00    Pack years: 45.00    Types: Cigarettes  . Smokeless tobacco: Never Used  . Tobacco comment: she uses vapor  no tobacco  Substance and Sexual Activity  . Alcohol use: Yes    Alcohol/week: 0.0 standard drinks    Comment: occasionally  . Drug use: No  . Sexual activity: Not Currently    Birth control/protection: Implant    Comment: Nexplanon   Other Topics Concern  . Not on file  Social History Narrative   Caffeine intake: "Mountain dew nut" can drink an 18 pack in 2-3 days   Exercise: No   Social Determinants of Health   Financial Resource Strain:   . Difficulty of Paying Living Expenses: Not on file  Food Insecurity:   . Worried About Programme researcher, broadcasting/film/video in the Last Year: Not on file  . Ran Out of Food in the Last Year: Not on file  Transportation Needs:   . Lack of Transportation (Medical): Not on file  . Lack of Transportation (Non-Medical): Not on file  Physical Activity:   . Days of Exercise per Week: Not on file  . Minutes of Exercise per Session: Not on file  Stress:   . Feeling of Stress : Not  on file  Social Connections:   . Frequency of Communication with Friends and Family: Not on file  . Frequency of Social Gatherings with Friends and Family: Not on file  . Attends Religious Services: Not on file  . Active Member of Clubs or Organizations: Not on file  . Attends Banker Meetings: Not on file  . Marital Status: Not on file  Intimate Partner Violence:   . Fear of Current or Ex-Partner: Not on file  . Emotionally Abused: Not on file  . Physically Abused: Not on file  . Sexually Abused: Not  on file   Family History  Problem Relation Age of Onset  . Coronary artery disease Mother 6       MI  . Heart attack Mother   . Hypertension Mother   . Diabetes Mother   . Asthma Mother   . Coronary artery disease Father 54       MI  . Heart attack Father   . Hypertension Father   . Stroke Neg Hx     OBJECTIVE:  Vitals:   12/21/18 1211  BP: 123/84  Pulse: 96  Resp: 18  Temp: 98.6 F (37 C)  TempSrc: Oral  SpO2: 94%     General appearance: alert; appears fatigued, but nontoxic; speaking in full sentences and tolerating own secretions HEENT: NCAT; Ears: EACs clear, TMs pearly gray; Eyes: PERRL.  EOM grossly intact. Sinuses: TTP maxillary sinuses; Nose: nares patent without rhinorrhea, Throat: oropharynx clear, tonsils non erythematous or enlarged, uvula midline  Neck: supple without LAD Lungs: unlabored respirations, symmetrical air entry; cough: moderate; no respiratory distress; decreased breath sounds, and diffuse wheezes heard throughout bilateral lung fields Heart: regular rate and rhythm.   Skin: warm and dry Psychological: alert and cooperative; normal mood and affect   ASSESSMENT & PLAN:  1. Suspected COVID-19 virus infection   2. Acute non-recurrent maxillary sinusitis   3. Wheezing   4. Mild intermittent asthma with exacerbation     Meds ordered this encounter  Medications  . doxycycline (VIBRAMYCIN) 100 MG capsule    Sig: Take 1  capsule (100 mg total) by mouth 2 (two) times daily.    Dispense:  20 capsule    Refill:  0    Order Specific Question:   Supervising Provider    Answer:   Eustace Moore [[id number]]  . fluconazole (DIFLUCAN) 200 MG tablet    Sig: Take one dose by mouth, wait 72 hours, and then take second dose by mouth    Dispense:  2 tablet    Refill:  0    Order Specific Question:   Supervising Provider    Answer:   Eustace Moore [[id number]]  . predniSONE (STERAPRED UNI-PAK 21 TAB) 10 MG (21) TBPK tablet    Sig: Take by mouth daily. Take 6 tabs by mouth daily  for 2 days, then 5 tabs for 2 days, then 4 tabs for 2 days, then 3 tabs for 2 days, 2 tabs for 2 days, then 1 tab by mouth daily for 2 days    Dispense:  42 tablet    Refill:  0    Order Specific Question:   Supervising Provider    Answer:   Eustace Moore [[id number]]  . ondansetron (ZOFRAN) 4 MG tablet    Sig: Take 1 tablet (4 mg total) by mouth every 6 (six) hours.    Dispense:  12 tablet    Refill:  0    Order Specific Question:   Supervising Provider    Answer:   Eustace Moore [[id number]]    COVID testing ordered.  It will take between 5-7 days for test results.  Someone will contact you regarding abnormal results.    In the meantime: You should remain isolated in your home for 10 days from symptom onset AND greater than 72 hours after symptoms resolution (absence of fever without the use of fever-reducing medication and improvement in respiratory symptoms), whichever is longer Get plenty of rest and push fluids Doxycycline prescribed for possible sinus infection Prednisone taper for asthma flare  Diflucan for possible yeast infection Use OTC zyrtec for nasal congestion, runny nose, and/or sore throat Use OTC flonase for nasal congestion and runny nose Use medications daily for symptom relief Use OTC medications like ibuprofen or tylenol as needed fever or pain Follow up PCP Call or go to the ED if you have any new or  worsening symptoms such as fever, worsening cough, shortness of breath, chest tightness, chest pain, turning blue, changes in mental status, etc...   Zofran prescribed for nausea.   Encouraged patient to follow up with Dr. Judee Claraorum to establish care and follow up for chronic cough.  Discussed most common causes of chronic cough are PND, asthma, acid reflux, and smoking, all of which patient reports.    Reviewed expectations re: course of current medical issues. Questions answered. Outlined signs and symptoms indicating need for more acute intervention. Patient verbalized understanding. After Visit Summary given.         Rennis HardingWurst, Rashanna Christiana, PA-C 12/21/18 1251

## 2018-12-23 LAB — NOVEL CORONAVIRUS, NAA: SARS-CoV-2, NAA: NOT DETECTED

## 2019-01-05 HISTORY — PX: DENTAL SURGERY: SHX609

## 2019-01-10 ENCOUNTER — Encounter (INDEPENDENT_AMBULATORY_CARE_PROVIDER_SITE_OTHER): Payer: Self-pay | Admitting: Primary Care

## 2019-01-10 ENCOUNTER — Other Ambulatory Visit: Payer: Self-pay

## 2019-01-10 ENCOUNTER — Ambulatory Visit (INDEPENDENT_AMBULATORY_CARE_PROVIDER_SITE_OTHER): Payer: Self-pay | Admitting: Primary Care

## 2019-01-10 DIAGNOSIS — Z131 Encounter for screening for diabetes mellitus: Secondary | ICD-10-CM

## 2019-01-10 DIAGNOSIS — E785 Hyperlipidemia, unspecified: Secondary | ICD-10-CM

## 2019-01-10 DIAGNOSIS — K219 Gastro-esophageal reflux disease without esophagitis: Secondary | ICD-10-CM

## 2019-01-10 DIAGNOSIS — Z760 Encounter for issue of repeat prescription: Secondary | ICD-10-CM

## 2019-01-10 DIAGNOSIS — Z09 Encounter for follow-up examination after completed treatment for conditions other than malignant neoplasm: Secondary | ICD-10-CM

## 2019-01-10 DIAGNOSIS — Z7689 Persons encountering health services in other specified circumstances: Secondary | ICD-10-CM

## 2019-01-10 DIAGNOSIS — J4520 Mild intermittent asthma, uncomplicated: Secondary | ICD-10-CM

## 2019-01-10 MED ORDER — IPRATROPIUM-ALBUTEROL 0.5-2.5 (3) MG/3ML IN SOLN: 3.0000 mL | RESPIRATORY_TRACT | 0 refills | Status: DC | PRN

## 2019-01-10 MED ORDER — MONTELUKAST SODIUM 10 MG PO TABS: 10.0000 mg | ORAL_TABLET | Freq: Every day | ORAL | 1 refills | Status: DC

## 2019-01-10 MED ORDER — OMEPRAZOLE 20 MG PO CPDR: 20.0000 mg | DELAYED_RELEASE_CAPSULE | Freq: Two times a day (BID) | ORAL | 1 refills | Status: DC

## 2019-01-10 MED ORDER — ALBUTEROL SULFATE HFA 108 (90 BASE) MCG/ACT IN AERS: 2.0000 | INHALATION_SPRAY | RESPIRATORY_TRACT | 1 refills | Status: DC | PRN

## 2019-01-10 MED ORDER — BUDESONIDE-FORMOTEROL FUMARATE 160-4.5 MCG/ACT IN AERO: 2.0000 | INHALATION_SPRAY | Freq: Two times a day (BID) | RESPIRATORY_TRACT | 3 refills | Status: DC

## 2019-01-10 MED FILL — IPRAT-ALBUT 0.5-3(2.5) MG/3: 0.5-2.5 (3) | 30 days supply | Qty: 360 | Fill #0

## 2019-01-10 MED FILL — SYMBICORT 160-4.5 MCG INH: 160-4.5 | 30 days supply | Qty: 10 | Fill #0

## 2019-01-10 MED FILL — OMEPRAZOLE 20 MG CAP: 20 | 30 days supply | Qty: 60 | Fill #0

## 2019-01-10 MED FILL — !PROVENTIL HFA 90 MCG INH: 108 (90 BAS | 25 days supply | Qty: 7 | Fill #0

## 2019-01-10 MED FILL — MONTELUKAST SOD 10 MG TAB: 10 | 30 days supply | Qty: 30 | Fill #0

## 2019-01-10 NOTE — Progress Notes (Signed)
Virtual Visit via Telephone Note  I connected with Kristen Price on 01/10/19 at  2:30 PM EST by telephone and verified that I am speaking with the correct person using two identifiers.   I discussed the limitations, risks, security and privacy concerns of performing an evaluation and management service by telephone and the availability of in person appointments. I also discussed with the patient that there may be a patient responsible charge related to this service. The patient expressed understanding and agreed to proceed.   History of Present Illness: Ms. Kristen Price is having a tele visit for establish care and hospital follow up. Seen in Urgent for symptoms COVID and tested. Past Medical History:  Diagnosis Date  . Anxiety   . Asthma   . Chronic lower back pain   . Depression   . Diabetes ([facility]) 2019  . Family history of adverse reaction to anesthesia    "mom didn't want to wake up at all"  . GERD (gastroesophageal reflux disease)   . Hiatal hernia   . Hypertension   . NSTEMI (non-ST elevated myocardial infarction) ([place]) 06/06/2014   stent to circumfkex  . Obesity    Current Outpatient Medications on File Prior to Visit  Medication Sig Dispense Refill  . aspirin EC 81 MG EC tablet Take 1 tablet (81 mg total) by mouth daily.    Marland Kitchen atorvastatin (LIPITOR) 80 MG tablet Take 1 tablet (80 mg total) by mouth daily. 90 tablet 3  . nitroGLYCERIN (NITROSTAT) 0.4 MG SL tablet Place 1 tablet (0.4 mg total) under the tongue every 5 (five) minutes x 3 doses as needed for chest pain. (Patient not taking: Reported on 01/10/2019) 25 tablet 12  . ondansetron (ZOFRAN) 4 MG tablet Take 1 tablet (4 mg total) by mouth every 6 (six) hours. 12 tablet 0   No current facility-administered medications on file prior to visit.     Observations/Objective: Review of Systems  All other systems reviewed and are negative.    Assessment and Plan: Darthy was seen today for hospitalization follow-up and  medication refill.  Diagnoses and all orders for this visit:  Encounter to establish care Juluis Mire, NP-C will be your  (PCP) mastered prepared that is able to that will  diagnosed and treatment able to answer health concern as well as continuing care of varied medical conditions, not limited by cause, organ system, or diagnosis.   Mild intermittent asthma without complication Comments: Refill of rescue medications at her request. Orders: -     montelukast (SINGULAIR) 10 MG tablet; Take 1 tablet (10 mg total) by mouth at bedtime. Patient needs office visit -     Discontinue: albuterol (VENTOLIN HFA) 108 (90 Base) MCG/ACT inhaler; Inhale 2 puffs into the lungs every 4 (four) hours as needed for wheezing or shortness of breath (cough, shortness of breath or wheezing.). Patient needs office visit -     ipratropium-albuterol (DUONEB) 0.5-2.5 (3) MG/3ML SOLN; Take 3 mLs by nebulization every 4 (four) hours as needed (shortness of breath). Patient needs office visit -     budesonide-formoterol (SYMBICORT) 160-4.5 MCG/ACT inhaler; Inhale 2 puffs into the lungs 2 (two) times daily. -     albuterol (VENTOLIN HFA) 108 (90 Base) MCG/ACT inhaler; Inhale 2 puffs into the lungs every 4 (four) hours as needed for wheezing or shortness of breath (cough, shortness of breath or wheezing.). Patient needs office visit  Hyperlipidemia, unspecified hyperlipidemia type Decrease your fatty foods, red meat, cheese, milk and increase fiber like  whole grains and veggies. You can also add a fiber supplement like Metamucil or Benefiber.  -     CMP14+EGFR; Future             Lipids-future  Gastroesophageal reflux disease without esophagitis Discussed eating small frequent meal, reduction in acidic foods, fried foods ,spicy foods, alcohol caffeine and tobacco and certain medications. Avoid laying down after eating 67mns-1hour, elevated head of the bed.  Screening for diabetes mellitus -     Hemoglobin  A1c  Other orders -     omeprazole (PRILOSEC) 20 MG capsule; Take 1 capsule (20 mg total) by mouth 2 (two) times daily before a meal.    Follow Up Instructions:    I discussed the assessment and treatment plan with the patient. The patient was provided an opportunity to ask questions and all were answered. The patient agreed with the plan and demonstrated an understanding of the instructions.   The patient was advised to call back or seek an in-person evaluation if the symptoms worsen or if the condition fails to improve as anticipated.  I provided 26 minutes of non-face-to-face time during this encounter.   MKerin Perna NP

## 2019-01-15 ENCOUNTER — Other Ambulatory Visit (INDEPENDENT_AMBULATORY_CARE_PROVIDER_SITE_OTHER): Payer: Self-pay

## 2019-03-07 ENCOUNTER — Other Ambulatory Visit: Payer: Self-pay | Admitting: Pulmonary Disease

## 2019-03-07 DIAGNOSIS — E1100 Type 2 diabetes mellitus with hyperosmolarity without nonketotic hyperglycemic-hyperosmolar coma (NKHHC): Secondary | ICD-10-CM

## 2019-03-19 ENCOUNTER — Other Ambulatory Visit (INDEPENDENT_AMBULATORY_CARE_PROVIDER_SITE_OTHER): Payer: Self-pay | Admitting: Primary Care

## 2019-03-19 DIAGNOSIS — J4520 Mild intermittent asthma, uncomplicated: Secondary | ICD-10-CM

## 2019-03-19 MED FILL — !PROVENTIL HFA 90 MCG INH: 108 (90 BAS | 25 days supply | Qty: 7 | Fill #1

## 2019-03-19 MED FILL — MONTELUKAST SOD 10 MG TAB: 10 | 30 days supply | Qty: 30 | Fill #1

## 2019-03-19 MED FILL — IPRAT-ALBUT 0.5-3(2.5) MG/3: 0.5-2.5 (3) | 30 days supply | Qty: 360 | Fill #0

## 2019-03-19 MED FILL — OMEPRAZOLE 20 MG CAP: 20 | 30 days supply | Qty: 60 | Fill #1

## 2019-03-20 ENCOUNTER — Ambulatory Visit
Admission: EM | Admit: 2019-03-20 | Discharge: 2019-03-20 | Disposition: A | Discharge status: Home / Self Care | Payer: Self-pay | Attending: Family Medicine | Admitting: Family Medicine

## 2019-03-20 ENCOUNTER — Other Ambulatory Visit: Payer: Self-pay

## 2019-03-20 DIAGNOSIS — J4541 Moderate persistent asthma with (acute) exacerbation: Secondary | ICD-10-CM

## 2019-03-20 DIAGNOSIS — J019 Acute sinusitis, unspecified: Secondary | ICD-10-CM

## 2019-03-20 MED ORDER — ALBUTEROL SULFATE HFA 108 (90 BASE) MCG/ACT IN AERS: 1.0000 | INHALATION_SPRAY | Freq: Four times a day (QID) | RESPIRATORY_TRACT | 0 refills | Status: DC | PRN

## 2019-03-20 MED ORDER — CEFDINIR 300 MG PO CAPS: 300.0000 mg | ORAL_CAPSULE | Freq: Two times a day (BID) | ORAL | 0 refills | Status: AC

## 2019-03-20 MED ORDER — IPRATROPIUM-ALBUTEROL 0.5-2.5 (3) MG/3ML IN SOLN: 3.0000 mL | RESPIRATORY_TRACT | 0 refills | Status: DC | PRN

## 2019-03-20 MED ORDER — PREDNISONE 50 MG PO TABS: 50.0000 mg | ORAL_TABLET | Freq: Every day | ORAL | 0 refills | Status: AC

## 2019-03-20 MED ORDER — ONDANSETRON 4 MG PO TBDP: 4.0000 mg | ORAL_TABLET | Freq: Three times a day (TID) | ORAL | 0 refills | Status: DC | PRN

## 2019-03-20 MED ORDER — DEXAMETHASONE SODIUM PHOSPHATE 10 MG/ML IJ SOLN
10.0000 mg | Freq: Once | INTRAMUSCULAR | Status: AC
  Administered 2019-03-20: 10 mg via INTRAMUSCULAR

## 2019-03-20 MED ORDER — CETIRIZINE HCL 10 MG PO CAPS: 10.0000 mg | ORAL_CAPSULE | Freq: Every day | ORAL | 0 refills | Status: DC

## 2019-03-20 MED ORDER — BENZONATATE 200 MG PO CAPS: 200.0000 mg | ORAL_CAPSULE | Freq: Three times a day (TID) | ORAL | 0 refills | Status: AC | PRN

## 2019-03-20 MED FILL — ONDANSETRON ODT 4 MG TABLET: 4 | 6 days supply | Qty: 20 | Fill #0

## 2019-03-20 MED FILL — BENZONATATE 100 MG CAPS: 100 | 9 days supply | Qty: 56 | Fill #0

## 2019-03-20 MED FILL — ?CETIRIZINE HCL 10 MG TABLE: 10 | 20 days supply | Qty: 20 | Fill #0

## 2019-03-20 MED FILL — ?PREDINSONE 10MG TABLETS: 10 | 5 days supply | Qty: 25 | Fill #0

## 2019-03-20 MED FILL — CEFDINIR 300 MG CAPSULE: 300 | 7 days supply | Qty: 14 | Fill #0

## 2019-03-20 NOTE — Discharge Instructions (Addendum)
We gave you an injection of Decadron today to help with your asthma Continue with course of prednisone 50 mg daily with food-take in the morning Begin Omnicef twice daily for 1 week for infection Continue Singulair, may add in daily allergy medicine like Zyrtec/cetirizine to help with postnasal drainage Tessalon/benzonatate for cough Continue inhaler/nebs as needed  Follow-up if not improving or worsening

## 2019-03-20 NOTE — ED Provider Notes (Signed)
[facility]    CSN: [id number] Arrival date & time: 03/20/19  1159      History   Chief Complaint Chief Complaint  Patient presents with  . Facial Pain    HPI Kristen Price is a 44 y.o. female history of diabetes, GERD, hypertension, asthma, tobacco use, presenting today for evaluation of persistent sinusitis and asthma flare.  Patient states that over the past couple of months she has had persistent sinus pressure and congestion.  Congestion has become very thick.  She has history of chronic sinusitis.  She has previously been treated with a course of doxycycline in December with slight improvement.  Over the past 2 weeks she has had worsening asthma reporting increased wheezing and shortness of breath.  She has been using inhaler and duo nebs.  She also uses Symbicort daily.  HPI  Past Medical History:  Diagnosis Date  . Anxiety   . Asthma   . Chronic lower back pain   . Depression   . Diabetes ([facility]) 2019  . Family history of adverse reaction to anesthesia    "mom didn't want to wake up at all"  . GERD (gastroesophageal reflux disease)   . Hiatal hernia   . Hypertension   . NSTEMI (non-ST elevated myocardial infarction) ([facility]) 06/06/2014   stent to circumfkex  . Obesity     Patient Active Problem List   Diagnosis Date Noted  . Diabetes mellitus ([facility]) 02/22/2018  . Obesity (BMI 35.0-39.9 without comorbidity) 02/22/2018  . Mild intermittent asthma without complication 03/06/2017  . Conductive hearing loss of left ear 08/19/2015  . Gallbladder disease 03/03/2015  . Coronary artery disease involving native coronary artery of native heart without angina pectoris 06/24/2014  . Elevated troponin   . History of non-ST elevation myocardial infarction (NSTEMI) 06/06/2014  . Tobacco abuse 06/06/2014  . Chronic low back pain 10/25/2011  . GAD (generalized anxiety disorder) 09/27/2011  . Insomnia 09/27/2011    Past Surgical History:  Procedure Laterality Date  .  CARDIAC CATHETERIZATION N/A 06/07/2014   Procedure: Left Heart Cath and Coronary Angiography;  Surgeon: Lennette Bihari, MD;  Location: [facility] INVASIVE CV LAB;  Service: Cardiovascular;  Laterality: N/A;  . CHOLECYSTECTOMY  2019  . FEMUR FRACTURE SURGERY Left 2003   "metal rod runs from hip to knee"  . FRACTURE SURGERY    . HOODED EYE SURGERY      OB History    Gravida  1   Para  1   Term  1   Preterm      AB      Living        SAB      TAB      Ectopic      Multiple      Live Births               Home Medications    Prior to Admission medications   Medication Sig Start Date End Date Taking? Authorizing Provider  albuterol (VENTOLIN HFA) 108 (90 Base) MCG/ACT inhaler Inhale 1-2 puffs into the lungs every 6 (six) hours as needed for wheezing or shortness of breath. 03/20/19   Maricarmen Braziel C, PA-C  aspirin EC 81 MG EC tablet Take 1 tablet (81 mg total) by mouth daily. 06/08/14   Dwana Melena, PA-C  atorvastatin (LIPITOR) 80 MG tablet Take 1 tablet (80 mg total) by mouth daily. 02/22/18   Dyann Kief, PA-C  benzonatate (TESSALON) 200 MG capsule Take  1 capsule (200 mg total) by mouth 3 (three) times daily as needed for up to 7 days for cough. 03/20/19 03/27/19  Kaliann Coryell C, PA-C  budesonide-formoterol (SYMBICORT) 160-4.5 MCG/ACT inhaler Inhale 2 puffs into the lungs 2 (two) times daily. 01/10/19   Grayce Sessions, NP  cefdinir (OMNICEF) 300 MG capsule Take 1 capsule (300 mg total) by mouth 2 (two) times daily for 7 days. 03/20/19 03/27/19  Janiece Scovill C, PA-C  Cetirizine HCl 10 MG CAPS Take 1 capsule (10 mg total) by mouth daily. 03/20/19   Lysandra Loughmiller C, PA-C  ipratropium-albuterol (DUONEB) 0.5-2.5 (3) MG/3ML SOLN Take 3 mLs by nebulization every 4 (four) hours as needed. 03/20/19   Jheremy Boger C, PA-C  montelukast (SINGULAIR) 10 MG tablet Take 1 tablet (10 mg total) by mouth at bedtime. Patient needs office visit 01/10/19   Grayce Sessions, NP   nitroGLYCERIN (NITROSTAT) 0.4 MG SL tablet Place 1 tablet (0.4 mg total) under the tongue every 5 (five) minutes x 3 doses as needed for chest pain. Patient not taking: Reported on 01/10/2019 02/22/18   Dyann Kief, PA-C  omeprazole (PRILOSEC) 20 MG capsule Take 1 capsule (20 mg total) by mouth 2 (two) times daily before a meal. 01/10/19   Grayce Sessions, NP  ondansetron (ZOFRAN ODT) 4 MG disintegrating tablet Take 1 tablet (4 mg total) by mouth every 8 (eight) hours as needed for nausea or vomiting. 03/20/19   Ko Bardon C, PA-C  predniSONE (DELTASONE) 50 MG tablet Take 1 tablet (50 mg total) by mouth daily for 5 days. 03/20/19 03/25/19  Jamal Haskin, Junius Creamer, PA-C    Family History Family History  Problem Relation Age of Onset  . Coronary artery disease Mother 26       MI  . Heart attack Mother   . Hypertension Mother   . Diabetes Mother   . Asthma Mother   . Coronary artery disease Father 60       MI  . Heart attack Father   . Hypertension Father   . Stroke Neg Hx     Social History Social History   Tobacco Use  . Smoking status: Former Smoker    Packs/day: 1.50    Years: 30.00    Pack years: 45.00    Types: Cigarettes  . Smokeless tobacco: Never Used  . Tobacco comment: she uses vapor 6mg  no tobacco  Substance Use Topics  . Alcohol use: Yes    Alcohol/week: 0.0 standard drinks    Comment: occasionally  . Drug use: No     Allergies   Amoxicillin, Augmentin [amoxicillin-pot clavulanate], Benadryl [diphenhydramine], Losartan, and Penicillins   Review of Systems Review of Systems  Constitutional: Positive for chills and fatigue. Negative for activity change, appetite change and fever.  HENT: Positive for congestion, rhinorrhea, sinus pressure and sore throat. Negative for ear pain and trouble swallowing.   Eyes: Negative for discharge and redness.  Respiratory: Positive for cough, shortness of breath and wheezing. Negative for chest tightness.   Cardiovascular:  Negative for chest pain.  Gastrointestinal: Negative for abdominal pain, diarrhea, nausea and vomiting.  Musculoskeletal: Negative for myalgias.  Skin: Negative for rash.  Neurological: Positive for headaches. Negative for dizziness and light-headedness.     Physical Exam Triage Vital Signs ED Triage Vitals  Enc Vitals Group     BP 03/20/19 1215 133/76     Pulse Rate 03/20/19 1215 87     Resp 03/20/19 1215 20  Temp 03/20/19 1215 98.3 F (36.8 C)     Temp src --      SpO2 03/20/19 1215 95 %     Weight --      Height --      Head Circumference --      Peak Flow --      Pain Score 03/20/19 1211 3     Pain Loc --      Pain Edu? --      Excl. in [place]? --    No data found.  Updated Vital Signs BP 133/76   Pulse 87   Temp 98.3 F (36.8 C)   Resp 20   LMP 03/14/2019   SpO2 95%   Visual Acuity Right Eye Distance:   Left Eye Distance:   Bilateral Distance:    Right Eye Near:   Left Eye Near:    Bilateral Near:     Physical Exam Vitals and nursing note reviewed.  Constitutional:      General: She is not in acute distress.    Appearance: She is well-developed.  HENT:     Head: Normocephalic and atraumatic.     Ears:     Comments: Bilateral ears without tenderness to palpation of external auricle, tragus and mastoid, EAC's without erythema or swelling, TM's with good bony landmarks and cone of light. Non erythematous.     Nose:     Comments: Nasal mucosa erythematous, mildly swollen turbinates bilaterally    Mouth/Throat:     Comments: Oral mucosa pink and moist, no tonsillar enlargement or exudate. Posterior pharynx patent and nonerythematous, no uvula deviation or swelling. Normal phonation. Eyes:     Conjunctiva/sclera: Conjunctivae normal.  Cardiovascular:     Rate and Rhythm: Normal rate and regular rhythm.     Heart sounds: No murmur.  Pulmonary:     Effort: Pulmonary effort is normal. No respiratory distress.     Breath sounds: Normal breath sounds.      Comments: Breathing comfortably at rest, CTABL, no wheezing, rales or other adventitious sounds auscultated Occasional cough in room, coughing triggered with deep inspiration  Musculoskeletal:     Cervical back: Neck supple.  Skin:    General: Skin is warm and dry.  Neurological:     Mental Status: She is alert.      UC Treatments / Results  Labs (all labs ordered are listed, but only abnormal results are displayed) Labs Reviewed - No data to display  EKG   Radiology No results found.  Procedures Procedures (including critical care time)  Medications Ordered in UC Medications  dexamethasone (DECADRON) injection 10 mg (10 mg Intramuscular Given 03/20/19 1251)    Initial Impression / Assessment and Plan / UC Course  I have reviewed the triage vital signs and the nursing notes.  Pertinent labs & imaging results that were available during my care of the patient were reviewed by me and considered in my medical decision making (see chart for details).     Treating for sinusitis, providing Omnicef as alternative.  Has allergy to penicillins, but appears to have previously tolerated this on chart review.  Providing Decadron IM 10 mg followed by 5-day course of prednisone.  Continue Singulair, add in cetirizine to help with postnasal drainage.  Tessalon for cough.  Zofran for nausea.  Rest and fluids.  Continue to monitor,Discussed strict return precautions. Patient verbalized understanding and is agreeable with plan.  Final Clinical Impressions(s) / UC Diagnoses   Final diagnoses:  Acute  sinusitis with symptoms > 10 days     Discharge Instructions     We gave you an injection of Decadron today to help with your asthma Continue with course of prednisone 50 mg daily with food-take in the morning Begin Omnicef twice daily for 1 week for infection Continue Singulair, may add in daily allergy medicine like Zyrtec/cetirizine to help with postnasal drainage Tessalon/benzonatate  for cough Continue inhaler/nebs as needed  Follow-up if not improving or worsening   ED Prescriptions    Medication Sig Dispense Auth. Provider   cefdinir (OMNICEF) 300 MG capsule Take 1 capsule (300 mg total) by mouth 2 (two) times daily for 7 days. 14 capsule Emeri Estill C, PA-C   predniSONE (DELTASONE) 50 MG tablet Take 1 tablet (50 mg total) by mouth daily for 5 days. 5 tablet Makenah Karas C, PA-C   ondansetron (ZOFRAN ODT) 4 MG disintegrating tablet Take 1 tablet (4 mg total) by mouth every 8 (eight) hours as needed for nausea or vomiting. 20 tablet Valma Rotenberg C, PA-C   benzonatate (TESSALON) 200 MG capsule Take 1 capsule (200 mg total) by mouth 3 (three) times daily as needed for up to 7 days for cough. 28 capsule Landin Tallon C, PA-C   Cetirizine HCl 10 MG CAPS Take 1 capsule (10 mg total) by mouth daily. 20 capsule Jatavious Peppard C, PA-C   albuterol (VENTOLIN HFA) 108 (90 Base) MCG/ACT inhaler Inhale 1-2 puffs into the lungs every 6 (six) hours as needed for wheezing or shortness of breath. 18 g Evanie Buckle C, PA-C   ipratropium-albuterol (DUONEB) 0.5-2.5 (3) MG/3ML SOLN Take 3 mLs by nebulization every 4 (four) hours as needed. 360 mL Adylin Hankey, [place] C, PA-C     PDMP not reviewed this encounter.   Lew Dawes, [place] 03/20/19 1336

## 2019-03-20 NOTE — ED Triage Notes (Signed)
Pt reports having ongoing sinus symptoms and asthma flare began about 2 weeks ago, pt having to use nebulizer more than usual

## 2019-04-13 ENCOUNTER — Telehealth: Payer: Self-pay | Admitting: Emergency Medicine

## 2019-04-13 ENCOUNTER — Other Ambulatory Visit: Payer: Self-pay

## 2019-04-13 ENCOUNTER — Ambulatory Visit
Admission: EM | Admit: 2019-04-13 | Discharge: 2019-04-13 | Disposition: A | Discharge status: Home / Self Care | Payer: BC Managed Care – PPO | Attending: Emergency Medicine | Admitting: Emergency Medicine

## 2019-04-13 DIAGNOSIS — Z760 Encounter for issue of repeat prescription: Secondary | ICD-10-CM

## 2019-04-13 DIAGNOSIS — K0889 Other specified disorders of teeth and supporting structures: Secondary | ICD-10-CM | POA: Diagnosis not present

## 2019-04-13 DIAGNOSIS — K029 Dental caries, unspecified: Secondary | ICD-10-CM

## 2019-04-13 DIAGNOSIS — J4541 Moderate persistent asthma with (acute) exacerbation: Secondary | ICD-10-CM

## 2019-04-13 DIAGNOSIS — J309 Allergic rhinitis, unspecified: Secondary | ICD-10-CM

## 2019-04-13 MED ORDER — FLUTICASONE PROPIONATE 50 MCG/ACT NA SUSP: 1.0000 | Freq: Every day | NASAL | 0 refills | Status: DC

## 2019-04-13 MED ORDER — CETIRIZINE HCL 10 MG PO TABS: 10.0000 mg | ORAL_TABLET | Freq: Every day | ORAL | 0 refills | Status: DC

## 2019-04-13 MED ORDER — METFORMIN HCL 500 MG PO TABS: 500.0000 mg | ORAL_TABLET | Freq: Two times a day (BID) | ORAL | 0 refills | Status: DC

## 2019-04-13 MED ORDER — CLINDAMYCIN HCL 150 MG PO CAPS: 150.0000 mg | ORAL_CAPSULE | Freq: Four times a day (QID) | ORAL | 0 refills | Status: DC

## 2019-04-13 MED ORDER — METHYLPREDNISOLONE SODIUM SUCC 125 MG IJ SOLR
125.0000 mg | Freq: Once | INTRAMUSCULAR | Status: AC
  Administered 2019-04-13: 125 mg via INTRAMUSCULAR

## 2019-04-13 MED ORDER — PREDNISONE 10 MG (21) PO TBPK: ORAL_TABLET | ORAL | 0 refills | Status: DC

## 2019-04-13 MED ORDER — CLEVER CHOICE NEBULIZER MISC: 1.0000 | 0 refills | Status: AC | PRN

## 2019-04-13 MED ORDER — OMEPRAZOLE 20 MG PO CPDR: 20.0000 mg | DELAYED_RELEASE_CAPSULE | Freq: Two times a day (BID) | ORAL | 0 refills | Status: DC

## 2019-04-13 NOTE — ED Triage Notes (Signed)
Pt presents with c/o sinus pressure and swelling, pt also has ear ache for past few days

## 2019-04-13 NOTE — ED Provider Notes (Signed)
[facility]    CSN: [id number] Arrival date & time: 04/13/19  1012      History   Chief Complaint Chief Complaint  Patient presents with  . Facial Pain    HPI Kristen Price is a 44 y.o. female.   With history of diabetes, hypertension, asthma and tobacco use presented to the urgent care for evaluation of persistent sinusitis, facial pain, dry cough and tooth ache for the past 3 to 4 weekss.  Was seen previously on 03/20/2019 and  was prescribed an antibiotic without relief. Believes symptoms are more related to allergy and asthma.  Has been using inhaler and DuoNeb as well as Symbicort. Report similar symptom in the past. Denies alleviating or worsening factors.  Denies chills, fever, nausea, vomiting, diarrhea, shortness of breath, chest pain, chest tightness.  The history is provided by the patient. No language interpreter was used.    Past Medical History:  Diagnosis Date  . Anxiety   . Asthma   . Chronic lower back pain   . Depression   . Diabetes (HCC) 2019  . Family history of adverse reaction to anesthesia    "mom didn't want to wake up at all"  . GERD (gastroesophageal reflux disease)   . Hiatal hernia   . Hypertension   . NSTEMI (non-ST elevated myocardial infarction) (HCC) 06/06/2014   stent to circumfkex  . Obesity     Patient Active Problem List   Diagnosis Date Noted  . Diabetes mellitus (HCC) 02/22/2018  . Obesity (BMI 35.0-39.9 without comorbidity) 02/22/2018  . Mild intermittent asthma without complication 03/06/2017  . Conductive hearing loss of left ear 08/19/2015  . Gallbladder disease 03/03/2015  . Coronary artery disease involving native coronary artery of native heart without angina pectoris 06/24/2014  . Elevated troponin   . History of non-ST elevation myocardial infarction (NSTEMI) 06/06/2014  . Tobacco abuse 06/06/2014  . Chronic low back pain 10/25/2011  . GAD (generalized anxiety disorder) 09/27/2011  . Insomnia 09/27/2011     Past Surgical History:  Procedure Laterality Date  . CARDIAC CATHETERIZATION N/A 06/07/2014   Procedure: Left Heart Cath and Coronary Angiography;  Surgeon: Lennette Bihari, MD;  Location: [facility] INVASIVE CV LAB;  Service: Cardiovascular;  Laterality: N/A;  . CHOLECYSTECTOMY  2019  . FEMUR FRACTURE SURGERY Left 2003   "metal rod runs from hip to knee"  . FRACTURE SURGERY    . HOODED EYE SURGERY      OB History    Gravida  1   Para  1   Term  1   Preterm      AB      Living        SAB      TAB      Ectopic      Multiple      Live Births               Home Medications    Prior to Admission medications   Medication Sig Start Date End Date Taking? Authorizing Provider  albuterol (VENTOLIN HFA) 108 (90 Base) MCG/ACT inhaler Inhale 1-2 puffs into the lungs every 6 (six) hours as needed for wheezing or shortness of breath. 03/20/19   Wieters, Hallie C, PA-C  aspirin EC 81 MG EC tablet Take 1 tablet (81 mg total) by mouth daily. 06/08/14   Dwana Melena, PA-C  atorvastatin (LIPITOR) 80 MG tablet Take 1 tablet (80 mg total) by mouth daily. 02/22/18   Jacolyn Reedy  M, PA-C  budesonide-formoterol (SYMBICORT) 160-4.5 MCG/ACT inhaler Inhale 2 puffs into the lungs 2 (two) times daily. 01/10/19   Grayce Sessions, NP  cetirizine (ZYRTEC ALLERGY) 10 MG tablet Take 1 tablet (10 mg total) by mouth daily. 04/13/19   Keighan Amezcua, Zachery Dakins, FNP  clindamycin (CLEOCIN) 150 MG capsule Take 1 capsule (150 mg total) by mouth every 6 (six) hours. 04/13/19   Ike Maragh, Zachery Dakins, FNP  fluticasone (FLONASE) 50 MCG/ACT nasal spray Place 1 spray into both nostrils daily for 14 days. 04/13/19 04/27/19  Honor Frison, Zachery Dakins, FNP  ipratropium-albuterol (DUONEB) 0.5-2.5 (3) MG/3ML SOLN Take 3 mLs by nebulization every 4 (four) hours as needed. 03/20/19   Wieters, Hallie C, PA-C  metFORMIN (GLUCOPHAGE) 500 MG tablet Take 1 tablet (500 mg total) by mouth 2 (two) times daily with a meal. 04/13/19   Ellenora Talton, Zachery Dakins,  FNP  montelukast (SINGULAIR) 10 MG tablet Take 1 tablet (10 mg total) by mouth at bedtime. Patient needs office visit 01/10/19   Grayce Sessions, NP  nitroGLYCERIN (NITROSTAT) 0.4 MG SL tablet Place 1 tablet (0.4 mg total) under the tongue every 5 (five) minutes x 3 doses as needed for chest pain. Patient not taking: Reported on 01/10/2019 02/22/18   Dyann Kief, PA-C  omeprazole (PRILOSEC) 20 MG capsule Take 1 capsule (20 mg total) by mouth in the morning and at bedtime. 04/13/19   Brookelynne Dimperio, Zachery Dakins, FNP  ondansetron (ZOFRAN ODT) 4 MG disintegrating tablet Take 1 tablet (4 mg total) by mouth every 8 (eight) hours as needed for nausea or vomiting. 03/20/19   Wieters, Hallie C, PA-C  predniSONE (STERAPRED UNI-PAK 21 TAB) 10 MG (21) TBPK tablet Take 6 tabs by mouth daily  for 2 days, then 5 tabs for 2 days, then 4 tabs for 2 days, then 3 tabs for 2 days, 2 tabs for 2 days, then 1 tab by mouth daily for 2 days 04/13/19   Durward Parcel, FNP    Family History Family History  Problem Relation Age of Onset  . Coronary artery disease Mother 36       MI  . Heart attack Mother   . Hypertension Mother   . Diabetes Mother   . Asthma Mother   . Coronary artery disease Father 28       MI  . Heart attack Father   . Hypertension Father   . Stroke Neg Hx     Social History Social History   Tobacco Use  . Smoking status: Former Smoker    Packs/day: 1.50    Years: 30.00    Pack years: 45.00    Types: Cigarettes  . Smokeless tobacco: Never Used  . Tobacco comment: she uses vapor 6mg  no tobacco  Substance Use Topics  . Alcohol use: Yes    Alcohol/week: 0.0 standard drinks    Comment: occasionally  . Drug use: No     Allergies   Amoxicillin, Augmentin [amoxicillin-pot clavulanate], Benadryl [diphenhydramine], Losartan, and Penicillins   Review of Systems Review of Systems  Constitutional: Negative.   HENT: Positive for facial swelling, sinus pressure and sinus pain.        Tooth  ache  Respiratory: Positive for cough.   Cardiovascular: Negative.   Gastrointestinal: Negative.   Neurological: Negative.   All other systems reviewed and are negative.    Physical Exam Triage Vital Signs ED Triage Vitals  Enc Vitals Group     BP 04/13/19 1021 (!) 143/84  Pulse Rate 04/13/19 1021 83     Resp 04/13/19 1021 16     Temp 04/13/19 1021 98.3 F (36.8 C)     Temp Source 04/13/19 1021 Oral     SpO2 04/13/19 1021 95 %     Weight --      Height --      Head Circumference --      Peak Flow --      Pain Score 04/13/19 1027 4     Pain Loc --      Pain Edu? --      Excl. in GC? --    No data found.  Updated Vital Signs BP (!) 143/84 (BP Location: Right Arm)   Pulse 83   Temp 98.3 F (36.8 C) (Oral)   Resp 16   LMP 03/14/2019   SpO2 95%   Visual Acuity Right Eye Distance:   Left Eye Distance:   Bilateral Distance:    Right Eye Near:   Left Eye Near:    Bilateral Near:     Physical Exam Vitals and nursing note reviewed.  Constitutional:      General: She is not in acute distress.    Appearance: Normal appearance. She is normal weight. She is not ill-appearing, toxic-appearing or diaphoretic.  HENT:     Head: Normocephalic.     Right Ear: Hearing, ear canal and external ear normal. A middle ear effusion is present. There is no impacted cerumen.     Left Ear: Hearing, ear canal and external ear normal. A middle ear effusion is present. There is no impacted cerumen.     Nose: No congestion or rhinorrhea.     Right Sinus: Maxillary sinus tenderness present.     Left Sinus: Maxillary sinus tenderness present.     Mouth/Throat:     Lips: Pink.     Mouth: Mucous membranes are moist. No oral lesions.     Dentition: Abnormal dentition. Dental tenderness and dental caries present.     Tongue: No lesions.     Palate: No mass.     Pharynx: Oropharynx is clear. No oropharyngeal exudate.     Tonsils: No tonsillar exudate.  Cardiovascular:     Rate and  Rhythm: Normal rate and regular rhythm.     Pulses: Normal pulses.     Heart sounds: Normal heart sounds. No murmur. No friction rub. No gallop.   Pulmonary:     Effort: Pulmonary effort is normal. No respiratory distress.     Breath sounds: No stridor. Wheezing present. No rhonchi or rales.  Chest:     Chest wall: No tenderness.  Neurological:     Mental Status: She is alert and oriented to person, place, and time.      UC Treatments / Results  Labs (all labs ordered are listed, but only abnormal results are displayed) Labs Reviewed - No data to display  EKG   Radiology No results found.  Procedures Procedures (including critical care time)  Medications Ordered in UC Medications  methylPREDNISolone sodium succinate (SOLU-MEDROL) 125 mg/2 mL injection 125 mg (125 mg Intramuscular Given 04/13/19 1050)    Initial Impression / Assessment and Plan / UC Course  I have reviewed the triage vital signs and the nursing notes.  Pertinent labs & imaging results that were available during my care of the patient were reviewed by me and considered in my medical decision making (see chart for details).    Patient is stable for discharge.  Sinusitis  symptom is likely related to allergy symptoms as she has completed the course of cefdinir without relief.  Solu-Medrol IM was given in office Prednisone, Zyrtec and Flonase were prescribed for allergic sinusitis.  Clindamycin was prescribed for dental caries and dental pain.  Metformin and Protonix were refilled.  Final Clinical Impressions(s) / UC Diagnoses   Final diagnoses:  Allergic sinusitis  Medication refill  Dental caries  Tooth ache     Discharge Instructions     Take medication as prescribed Follow-up with PCP Follow-up with your dentist ASAP Return or go to ED for worsening symptoms.    ED Prescriptions    Medication Sig Dispense Auth. Provider   predniSONE (STERAPRED UNI-PAK 21 TAB) 10 MG (21) TBPK tablet Take 6  tabs by mouth daily  for 2 days, then 5 tabs for 2 days, then 4 tabs for 2 days, then 3 tabs for 2 days, 2 tabs for 2 days, then 1 tab by mouth daily for 2 days 42 tablet Samanyu Tinnell, Darrelyn Hillock, FNP   cetirizine (ZYRTEC ALLERGY) 10 MG tablet Take 1 tablet (10 mg total) by mouth daily. 30 tablet Neah Sporrer S, FNP   fluticasone (FLONASE) 50 MCG/ACT nasal spray Place 1 spray into both nostrils daily for 14 days. 16 g Reagan Klemz, Darrelyn Hillock, FNP   omeprazole (PRILOSEC) 20 MG capsule Take 1 capsule (20 mg total) by mouth in the morning and at bedtime. 60 capsule Manjit Bufano, Darrelyn Hillock, FNP   metFORMIN (GLUCOPHAGE) 500 MG tablet Take 1 tablet (500 mg total) by mouth 2 (two) times daily with a meal. 60 tablet Krishika Bugge, Darrelyn Hillock, FNP   clindamycin (CLEOCIN) 150 MG capsule Take 1 capsule (150 mg total) by mouth every 6 (six) hours. 28 capsule Nicky Milhouse, Darrelyn Hillock, FNP     PDMP not reviewed this encounter.   Emerson Monte, FNP 04/13/19 1115

## 2019-04-13 NOTE — Discharge Instructions (Signed)
Take medication as prescribed Follow-up with PCP Follow-up with your dentist ASAP Return or go to ED for worsening symptoms.

## 2019-04-13 NOTE — Telephone Encounter (Signed)
Patient called and would like a nebulizer to be prescribed.  Nebulizer prescription was sent to [facility] in [place].

## 2019-04-23 ENCOUNTER — Ambulatory Visit
Admission: EM | Admit: 2019-04-23 | Discharge: 2019-04-23 | Disposition: A | Discharge status: Home / Self Care | Payer: BC Managed Care – PPO | Attending: Emergency Medicine | Admitting: Emergency Medicine

## 2019-04-23 DIAGNOSIS — J320 Chronic maxillary sinusitis: Secondary | ICD-10-CM

## 2019-04-23 DIAGNOSIS — R491 Aphonia: Secondary | ICD-10-CM

## 2019-04-23 MED ORDER — AZITHROMYCIN 250 MG PO TABS: 250.0000 mg | ORAL_TABLET | Freq: Every day | ORAL | 0 refills | Status: DC

## 2019-04-23 NOTE — ED Provider Notes (Signed)
[facility]    CSN: [id number] Arrival date & time: 04/23/19  0807      History   Chief Complaint No chief complaint on file.   HPI Kristen Price is a 44 y.o. female.   Works as a Clinical biochemist  presented to the urgent care with a complaint of loss of voice and worsening sinus symptoms for the past few days.  Reported a yellowish-Faulstich mucus.  Was seen on 04/13/2019 for same symptoms.   She reports mild relief with medication prescribed.  Denies sick exposure to COVID, flu or strep.  Denies recent travel.  Denies aggravating or alleviating symptoms.  Denies previous COVID infection.   Denies fever, chills, fatigue, nasal congestion, rhinorrhea, sore throat, cough, SOB, wheezing, chest pain, nausea, vomiting, changes in bowel or bladder habits.    The history is provided by the patient. No language interpreter was used.    Past Medical History:  Diagnosis Date  . Anxiety   . Asthma   . Chronic lower back pain   . Depression   . Diabetes (HCC) 2019  . Family history of adverse reaction to anesthesia    "mom didn't want to wake up at all"  . GERD (gastroesophageal reflux disease)   . Hiatal hernia   . Hypertension   . NSTEMI (non-ST elevated myocardial infarction) (HCC) 06/06/2014   stent to circumfkex  . Obesity     Patient Active Problem List   Diagnosis Date Noted  . Diabetes mellitus (HCC) 02/22/2018  . Obesity (BMI 35.0-39.9 without comorbidity) 02/22/2018  . Mild intermittent asthma without complication 03/06/2017  . Conductive hearing loss of left ear 08/19/2015  . Gallbladder disease 03/03/2015  . Coronary artery disease involving native coronary artery of native heart without angina pectoris 06/24/2014  . Elevated troponin   . History of non-ST elevation myocardial infarction (NSTEMI) 06/06/2014  . Tobacco abuse 06/06/2014  . Chronic low back pain 10/25/2011  . GAD (generalized anxiety disorder) 09/27/2011  . Insomnia 09/27/2011    Past  Surgical History:  Procedure Laterality Date  . CARDIAC CATHETERIZATION N/A 06/07/2014   Procedure: Left Heart Cath and Coronary Angiography;  Surgeon: Lennette Bihari, MD;  Location: [facility] INVASIVE CV LAB;  Service: Cardiovascular;  Laterality: N/A;  . CHOLECYSTECTOMY  2019  . FEMUR FRACTURE SURGERY Left 2003   "metal rod runs from hip to knee"  . FRACTURE SURGERY    . HOODED EYE SURGERY      OB History    Gravida  1   Para  1   Term  1   Preterm      AB      Living        SAB      TAB      Ectopic      Multiple      Live Births               Home Medications    Prior to Admission medications   Medication Sig Start Date End Date Taking? Authorizing Provider  albuterol (VENTOLIN HFA) 108 (90 Base) MCG/ACT inhaler Inhale 1-2 puffs into the lungs every 6 (six) hours as needed for wheezing or shortness of breath. 03/20/19   Wieters, Hallie C, PA-C  aspirin EC 81 MG EC tablet Take 1 tablet (81 mg total) by mouth daily. 06/08/14   Dwana Melena, PA-C  atorvastatin (LIPITOR) 80 MG tablet Take 1 tablet (80 mg total) by mouth daily. 02/22/18   Geni Bers,  Tarri Abernethy, PA-C  azithromycin (ZITHROMAX) 250 MG tablet Take 1 tablet (250 mg total) by mouth daily. Take first 2 tablets together, then 1 every day until finished. 04/23/19   Willis Kuipers, Zachery Dakins, FNP  budesonide-formoterol (SYMBICORT) 160-4.5 MCG/ACT inhaler Inhale 2 puffs into the lungs 2 (two) times daily. 01/10/19   Grayce Sessions, NP  cetirizine (ZYRTEC ALLERGY) 10 MG tablet Take 1 tablet (10 mg total) by mouth daily. 04/13/19   Halford Goetzke, Zachery Dakins, FNP  clindamycin (CLEOCIN) 150 MG capsule Take 1 capsule (150 mg total) by mouth every 6 (six) hours. 04/13/19   Sulma Ruffino, Zachery Dakins, FNP  fluticasone (FLONASE) 50 MCG/ACT nasal spray Place 1 spray into both nostrils daily for 14 days. 04/13/19 04/27/19  Syriah Delisi, Zachery Dakins, FNP  ipratropium-albuterol (DUONEB) 0.5-2.5 (3) MG/3ML SOLN Take 3 mLs by nebulization every 4 (four) hours as needed.  03/20/19   Wieters, Hallie C, PA-C  metFORMIN (GLUCOPHAGE) 500 MG tablet Take 1 tablet (500 mg total) by mouth 2 (two) times daily with a meal. 04/13/19   Bijan Ridgley, Zachery Dakins, FNP  montelukast (SINGULAIR) 10 MG tablet Take 1 tablet (10 mg total) by mouth at bedtime. Patient needs office visit 01/10/19   Grayce Sessions, NP  Nebulizers (CLEVER CHOICE NEBULIZER) MISC 1 each by Does not apply route as needed (Use nebulizer as needed for wheezing and cough). 04/13/19   Mandeep Ferch, Zachery Dakins, FNP  nitroGLYCERIN (NITROSTAT) 0.4 MG SL tablet Place 1 tablet (0.4 mg total) under the tongue every 5 (five) minutes x 3 doses as needed for chest pain. Patient not taking: Reported on 01/10/2019 02/22/18   Dyann Kief, PA-C  omeprazole (PRILOSEC) 20 MG capsule Take 1 capsule (20 mg total) by mouth in the morning and at bedtime. 04/13/19   Roben Tatsch, Zachery Dakins, FNP  ondansetron (ZOFRAN ODT) 4 MG disintegrating tablet Take 1 tablet (4 mg total) by mouth every 8 (eight) hours as needed for nausea or vomiting. 03/20/19   Wieters, Hallie C, PA-C  predniSONE (STERAPRED UNI-PAK 21 TAB) 10 MG (21) TBPK tablet Take 6 tabs by mouth daily  for 2 days, then 5 tabs for 2 days, then 4 tabs for 2 days, then 3 tabs for 2 days, 2 tabs for 2 days, then 1 tab by mouth daily for 2 days 04/13/19   Durward Parcel, FNP    Family History Family History  Problem Relation Age of Onset  . Coronary artery disease Mother 19       MI  . Heart attack Mother   . Hypertension Mother   . Diabetes Mother   . Asthma Mother   . Coronary artery disease Father 49       MI  . Heart attack Father   . Hypertension Father   . Stroke Neg Hx     Social History Social History   Tobacco Use  . Smoking status: Former Smoker    Packs/day: 1.50    Years: 30.00    Pack years: 45.00    Types: Cigarettes  . Smokeless tobacco: Never Used  . Tobacco comment: she uses vapor 6mg  no tobacco  Substance Use Topics  . Alcohol use: Yes    Alcohol/week: 0.0  standard drinks    Comment: occasionally  . Drug use: No     Allergies   Amoxicillin, Augmentin [amoxicillin-pot clavulanate], Benadryl [diphenhydramine], Losartan, and Penicillins   Review of Systems Review of Systems  Constitutional: Negative.   HENT: Positive for sinus pressure and sinus pain.  Loss of voice  Respiratory: Negative.   Cardiovascular: Negative.   Endocrine: Negative.   All other systems reviewed and are negative.    Physical Exam Triage Vital Signs ED Triage Vitals  Enc Vitals Group     BP 04/23/19 0813 (!) 147/91     Pulse Rate 04/23/19 0813 84     Resp 04/23/19 0813 18     Temp 04/23/19 0813 98 F (36.7 C)     Temp src --      SpO2 04/23/19 0813 96 %     Weight --      Height --      Head Circumference --      Peak Flow --      Pain Score 04/23/19 0812 4     Pain Loc --      Pain Edu? --      Excl. in GC? --    No data found.  Updated Vital Signs BP (!) 147/91   Pulse 84   Temp 98 F (36.7 C)   Resp 18   SpO2 96%   Visual Acuity Right Eye Distance:   Left Eye Distance:   Bilateral Distance:    Right Eye Near:   Left Eye Near:    Bilateral Near:     Physical Exam Vitals and nursing note reviewed.  Constitutional:      General: She is not in acute distress.    Appearance: Normal appearance. She is normal weight. She is not ill-appearing, toxic-appearing or diaphoretic.  HENT:     Head: Normocephalic.     Right Ear: Tympanic membrane, ear canal and external ear normal. There is no impacted cerumen.     Left Ear: Tympanic membrane, ear canal and external ear normal. There is no impacted cerumen.     Nose: No congestion or rhinorrhea.     Right Sinus: Maxillary sinus tenderness present.     Left Sinus: Maxillary sinus tenderness present.     Mouth/Throat:     Lips: Pink.     Mouth: Mucous membranes are moist.     Tongue: No lesions.     Pharynx: Oropharynx is clear. No pharyngeal swelling, oropharyngeal exudate or  posterior oropharyngeal erythema.     Tonsils: No tonsillar exudate. 1+ on the right. 1+ on the left.  Cardiovascular:     Rate and Rhythm: Normal rate and regular rhythm.     Pulses: Normal pulses.     Heart sounds: Normal heart sounds. No murmur. No friction rub. No gallop.   Neurological:     Mental Status: She is alert.      UC Treatments / Results  Labs (all labs ordered are listed, but only abnormal results are displayed) Labs Reviewed - No data to display  EKG   Radiology No results found.  Procedures Procedures (including critical care time)  Medications Ordered in UC Medications - No data to display  Initial Impression / Assessment and Plan / UC Course  I have reviewed the triage vital signs and the nursing notes.  Pertinent labs & imaging results that were available during my care of the patient were reviewed by me and considered in my medical decision making (see chart for details).    Patient stable at discharge.  She will be treated for acute sinusitis.  Azithromycin will be prescribed.  Was advised to follow-up with PCP for possible referral to ENT if symptom does not resolve.  Work note was given. Final Clinical Impressions(s) / UC  Diagnoses   Final diagnoses:  Chronic maxillary sinusitis  Loss of voice     Discharge Instructions     Azithromycin was prescribed take as directed Follow-up with PCP for referral to ENT if symptom does not resolve Return or go to ED for worsening of symptoms    ED Prescriptions    Medication Sig Dispense Auth. Provider   azithromycin (ZITHROMAX) 250 MG tablet Take 1 tablet (250 mg total) by mouth daily. Take first 2 tablets together, then 1 every day until finished. 6 tablet Armanie Ullmer, Darrelyn Hillock, FNP     PDMP not reviewed this encounter.   Emerson Monte, [place] 04/23/19 [phone]

## 2019-04-23 NOTE — Discharge Instructions (Addendum)
Azithromycin was prescribed take as directed Follow-up with PCP for referral to ENT if symptom does not resolve Continue to take medication as prescribed previously as directed Return or go to ED for worsening of symptoms

## 2019-04-23 NOTE — ED Triage Notes (Signed)
Pt presents with c/o hoarse voice and continues to have sinus symptoms

## 2019-05-25 ENCOUNTER — Telehealth: Payer: Self-pay | Admitting: Cardiovascular Disease

## 2019-05-25 DIAGNOSIS — Z760 Encounter for issue of repeat prescription: Secondary | ICD-10-CM

## 2019-05-25 MED ORDER — ATORVASTATIN CALCIUM 80 MG PO TABS: 80.0000 mg | ORAL_TABLET | Freq: Every day | ORAL | 0 refills | Status: DC

## 2019-05-25 NOTE — Telephone Encounter (Signed)
Patient is scheduled to see Dr. Clifton James in August. Instructed patient to come to her appointment fasting and we will plan to check LIPIDS at that time. Refill sent in for atorvastatin.

## 2019-05-25 NOTE — Telephone Encounter (Signed)
New Message    Pt is wondering if she can have labs done the day she comes in for her appt. She says it has been awhile since she had them done.  She also states she has not saw a PCP in a long time and is having a hard time getting an appointment with one, she is wondering if Dr Clifton James could refill her Metformin.     Please advise

## 2019-05-25 NOTE — Telephone Encounter (Signed)
New Message     *STAT* If patient is at the pharmacy, call can be transferred to refill team.   1. Which medications need to be refilled? (please list name of each medication and dose if known) atorvastatin (LIPITOR) 80 MG tablet  2. Which pharmacy/location (including street and city if local pharmacy) is medication to be sent to? [facility] in [address]   3. Do they need a 30 day or 90 day supply? 90

## 2019-05-25 NOTE — Telephone Encounter (Signed)
Kristen Price started the patient on this med when she last saw her on 2/19 /2020. At that visit she was instructed to return for a fasting lipid panel that same week and then return for a repeat fasting lipid panel in 3 months. I do not see where patient had either of these labs drawn. Okay to refill? Please advise. Thanks, MI

## 2019-05-25 NOTE — Telephone Encounter (Signed)
Patient has been given phone number for Triage [facility] for assitance with finding PCP.  Her atorvastatin was refilled for 90 days until she can come fasting to her appointment with Dr. Clifton James in August.  Pt made aware that I will forward to Dr. Clifton James regarding refilling metformin.  She is aware that typically non cardiac meds are not filled by cardiology but that I will forward to Dr. Clifton James to see if he is agreeable to a refill of metformin.  I cannot see where the patient has ever been seen in office by Dr. Clifton James.  Saw in hospital in 2016.

## 2019-05-28 MED ORDER — METFORMIN HCL 500 MG PO TABS: 500.0000 mg | ORAL_TABLET | Freq: Two times a day (BID) | ORAL | 0 refills | Status: DC

## 2019-05-28 NOTE — Telephone Encounter (Signed)
Left message for pt to call back. Refilled metformin. 90 day supply.

## 2019-05-28 NOTE — Telephone Encounter (Signed)
We can refill the metformin. Kristen Price

## 2019-06-13 ENCOUNTER — Ambulatory Visit
Admission: EM | Admit: 2019-06-13 | Discharge: 2019-06-13 | Disposition: A | Discharge status: Home / Self Care | Payer: BC Managed Care – PPO | Attending: Emergency Medicine | Admitting: Emergency Medicine

## 2019-06-13 ENCOUNTER — Other Ambulatory Visit: Payer: Self-pay

## 2019-06-13 DIAGNOSIS — Z79899 Other long term (current) drug therapy: Secondary | ICD-10-CM | POA: Insufficient documentation

## 2019-06-13 DIAGNOSIS — Z7982 Long term (current) use of aspirin: Secondary | ICD-10-CM | POA: Insufficient documentation

## 2019-06-13 DIAGNOSIS — I252 Old myocardial infarction: Secondary | ICD-10-CM | POA: Diagnosis not present

## 2019-06-13 DIAGNOSIS — Z87891 Personal history of nicotine dependence: Secondary | ICD-10-CM | POA: Insufficient documentation

## 2019-06-13 DIAGNOSIS — Z880 Allergy status to penicillin: Secondary | ICD-10-CM | POA: Diagnosis not present

## 2019-06-13 DIAGNOSIS — K219 Gastro-esophageal reflux disease without esophagitis: Secondary | ICD-10-CM | POA: Diagnosis not present

## 2019-06-13 DIAGNOSIS — Z8249 Family history of ischemic heart disease and other diseases of the circulatory system: Secondary | ICD-10-CM | POA: Insufficient documentation

## 2019-06-13 DIAGNOSIS — Z888 Allergy status to other drugs, medicaments and biological substances status: Secondary | ICD-10-CM | POA: Diagnosis not present

## 2019-06-13 DIAGNOSIS — R102 Pelvic and perineal pain: Secondary | ICD-10-CM | POA: Insufficient documentation

## 2019-06-13 DIAGNOSIS — R42 Dizziness and giddiness: Secondary | ICD-10-CM | POA: Insufficient documentation

## 2019-06-13 DIAGNOSIS — Z113 Encounter for screening for infections with a predominantly sexual mode of transmission: Secondary | ICD-10-CM | POA: Insufficient documentation

## 2019-06-13 DIAGNOSIS — Z7951 Long term (current) use of inhaled steroids: Secondary | ICD-10-CM | POA: Insufficient documentation

## 2019-06-13 DIAGNOSIS — I10 Essential (primary) hypertension: Secondary | ICD-10-CM | POA: Insufficient documentation

## 2019-06-13 DIAGNOSIS — Z7984 Long term (current) use of oral hypoglycemic drugs: Secondary | ICD-10-CM | POA: Diagnosis not present

## 2019-06-13 DIAGNOSIS — E119 Type 2 diabetes mellitus without complications: Secondary | ICD-10-CM | POA: Diagnosis not present

## 2019-06-13 LAB — POCT URINALYSIS DIP (MANUAL ENTRY)
Bilirubin, UA: NEGATIVE
Glucose, UA: NEGATIVE mg/dL
Ketones, POC UA: NEGATIVE mg/dL
Leukocytes, UA: NEGATIVE
Nitrite, UA: NEGATIVE
Protein Ur, POC: NEGATIVE mg/dL
Spec Grav, UA: >=1.03 — AB (ref 1.010–1.025)
Urobilinogen, UA: 0.2 E.U./dL
pH, UA: 5.5 (ref 5.0–8.0)

## 2019-06-13 LAB — POCT URINE PREGNANCY: Preg Test, Ur: NEGATIVE

## 2019-06-13 MED ORDER — FLUCONAZOLE 150 MG PO TABS: 150.0000 mg | ORAL_TABLET | Freq: Every day | ORAL | 0 refills | Status: DC

## 2019-06-13 MED ORDER — METRONIDAZOLE 500 MG PO TABS: 500.0000 mg | ORAL_TABLET | Freq: Two times a day (BID) | ORAL | 0 refills | Status: DC

## 2019-06-13 NOTE — ED Provider Notes (Signed)
[facility]   [id number] 06/13/19 Arrival Time: 1859   Chief Complaint  Patient presents with   Vaginal Discharge   Dizziness    SUBJECTIVE:  Kristen Price is a 44 y.o. female who presents with complaints of gradual vaginal pain, fishy odor and itching for the past 2 days.  She denies a precipitating event, recent sexual encounter or recent antibiotic use.  Patient is sexually active with 1 female partner.  Denies vaginal discharge.  She has tried OTC medications without relief.  She reports similar symptoms in the past and was diagnosed with BVand treated accordingly.  She denies fever, chills, nausea, vomiting, abdominal or pelvic pain, urinary symptoms, vaginal itching, vaginal odor, vaginal bleeding, dyspareunia, vaginal rashes or lesions.  She is also complaining of dizziness for the past few days.  Denies any precipitating.  Described dizziness as unsteady.  Has tried OTC medication without relief.  Admits to his previous symptoms in the past that improved with Zofran.   Patient's last menstrual period was 05/25/2019. Current birth control method: Compliant with BC:  ROS: As per HPI.  All other pertinent ROS negative.     Past Medical History:  Diagnosis Date   Anxiety    Asthma    Chronic lower back pain    Depression    Diabetes (HCC) 2019   Family history of adverse reaction to anesthesia    "mom didn't want to wake up at all"   GERD (gastroesophageal reflux disease)    Hiatal hernia    Hypertension    NSTEMI (non-ST elevated myocardial infarction) (HCC) 06/06/2014   stent to circumfkex   Obesity    Past Surgical History:  Procedure Laterality Date   CARDIAC CATHETERIZATION N/A 06/07/2014   Procedure: Left Heart Cath and Coronary Angiography;  Surgeon: Lennette Bihari, MD;  Location: [facility] INVASIVE CV LAB;  Service: Cardiovascular;  Laterality: N/A;   CHOLECYSTECTOMY  2019   FEMUR FRACTURE SURGERY Left 2003   "metal rod runs from hip to knee"     FRACTURE SURGERY     HOODED EYE SURGERY     Allergies  Allergen Reactions   Amoxicillin Nausea And Vomiting    Did it involve swelling of the face/tongue/throat, SOB, or low BP? No Did it involve sudden or severe rash/hives, skin peeling, or any reaction on the inside of your mouth or nose? No Did you need to seek medical attention at a hospital or doctor's office? No When did it last happen? If all above answers are NO, may proceed with cephalosporin use.    Augmentin [Amoxicillin-Pot Clavulanate] Nausea And Vomiting   Benadryl [Diphenhydramine] Other (See Comments)    Patient stated that it makes her "go nuts"   Losartan Cough   Penicillins Nausea And Vomiting   No current facility-administered medications on file prior to encounter.   Current Outpatient Medications on File Prior to Encounter  Medication Sig Dispense Refill   albuterol (VENTOLIN HFA) 108 (90 Base) MCG/ACT inhaler Inhale 1-2 puffs into the lungs every 6 (six) hours as needed for wheezing or shortness of breath. 18 g 0   aspirin EC 81 MG EC tablet Take 1 tablet (81 mg total) by mouth daily.     atorvastatin (LIPITOR) 80 MG tablet Take 1 tablet (80 mg total) by mouth daily. 90 tablet 0   budesonide-formoterol (SYMBICORT) 160-4.5 MCG/ACT inhaler Inhale 2 puffs into the lungs 2 (two) times daily. 1 Inhaler 3   cetirizine (ZYRTEC ALLERGY) 10 MG tablet Take 1  tablet (10 mg total) by mouth daily. 30 tablet 0   fluticasone (FLONASE) 50 MCG/ACT nasal spray Place 1 spray into both nostrils daily for 14 days. 16 g 0   ipratropium-albuterol (DUONEB) 0.5-2.5 (3) MG/3ML SOLN Take 3 mLs by nebulization every 4 (four) hours as needed. 360 mL 0   metFORMIN (GLUCOPHAGE) 500 MG tablet Take 1 tablet (500 mg total) by mouth 2 (two) times daily with a meal. 180 tablet 0   montelukast (SINGULAIR) 10 MG tablet Take 1 tablet (10 mg total) by mouth at bedtime. Patient needs office visit 90 tablet 1   Nebulizers  (CLEVER CHOICE NEBULIZER) MISC 1 each by Does not apply route as needed (Use nebulizer as needed for wheezing and cough). 1 each 0   nitroGLYCERIN (NITROSTAT) 0.4 MG SL tablet Place 1 tablet (0.4 mg total) under the tongue every 5 (five) minutes x 3 doses as needed for chest pain. (Patient not taking: Reported on 01/10/2019) 25 tablet 12   omeprazole (PRILOSEC) 20 MG capsule Take 1 capsule (20 mg total) by mouth in the morning and at bedtime. 60 capsule 0   ondansetron (ZOFRAN ODT) 4 MG disintegrating tablet Take 1 tablet (4 mg total) by mouth every 8 (eight) hours as needed for nausea or vomiting. 20 tablet 0    Social History   Socioeconomic History   Marital status: Divorced    Spouse name: Not on file   Number of children: 1   Years of education: Not on file   Highest education level: Not on file  Occupational History   Occupation: Works from home for [employer]  Tobacco Use   Smoking status: Former Smoker    Packs/day: 1.50    Years: 30.00    Pack years: 45.00    Types: Cigarettes   Smokeless tobacco: Never Used   Tobacco comment: she uses vapor 6mg  no tobacco  Substance and Sexual Activity   Alcohol use: Yes    Alcohol/week: 0.0 standard drinks    Comment: occasionally   Drug use: No   Sexual activity: Not Currently    Birth control/protection: Implant    Comment: Nexplanon   Other Topics Concern   Not on file  Social History Narrative   Caffeine intake: "Mountain dew nut" can drink an 18 pack in 2-3 days   Exercise: No   Social Determinants of Strain:    Difficulty of Paying Living Expenses:   Food Insecurity:    Worried About Corporate investment banker in the Last Year:    Programme researcher, broadcasting/film/video in the Last Year:   Transportation Needs:    Barista (Medical):    Lack of Transportation (Non-Medical):   Physical Activity:    Days of Exercise per Week:    Minutes of Exercise per Session:   Stress:    Feeling of Stress  :   Social Connections:    Frequency of Communication with Friends and Family:    Frequency of Social Gatherings with Friends and Family:    Attends Religious Services:    Active Member of Clubs or Organizations:    Attends Freight forwarder:    Marital Status:   Intimate Partner Violence:    Fear of Current or Ex-Partner:    Emotionally Abused:    Physically Abused:    Sexually Abused:    Family History  Problem Relation Age of Onset   Coronary artery disease Mother 35  MI   Heart attack Mother    Hypertension Mother    Diabetes Mother    Asthma Mother    Coronary artery disease Father 21       MI   Heart attack Father    Hypertension Father    Stroke Neg Hx     OBJECTIVE:  Vitals:   06/13/19 1908  BP: (!) 154/80  Pulse: 90  Resp: 18  Temp: 98.2 F (36.8 C)  SpO2: 95%     Physical Exam Vitals and nursing note reviewed.  Constitutional:      General: She is not in acute distress.    Appearance: Normal appearance. She is normal weight. She is not ill-appearing, toxic-appearing or diaphoretic.  Cardiovascular:     Rate and Rhythm: Normal rate and regular rhythm.     Pulses: Normal pulses.     Heart sounds: Normal heart sounds. No murmur. No friction rub. No gallop.   Pulmonary:     Effort: Pulmonary effort is normal. No respiratory distress.     Breath sounds: Normal breath sounds. No stridor. No wheezing, rhonchi or rales.  Chest:     Chest wall: No tenderness.  Abdominal:     General: Abdomen is flat. Bowel sounds are normal. There is no distension.     Palpations: There is no mass.     Tenderness: There is no abdominal tenderness.     Hernia: No hernia is present.  Neurological:     General: No focal deficit present.     Mental Status: She is alert and oriented to person, place, and time.     GCS: GCS eye subscore is 4. GCS verbal subscore is 5. GCS motor subscore is 6.     Cranial Nerves: Cranial nerves are intact.  No cranial nerve deficit.     Sensory: Sensation is intact. No sensory deficit.     Motor: Motor function is intact. No weakness.     Coordination: Coordination is intact. Coordination normal.     Gait: Gait is intact. Gait normal.     Deep Tendon Reflexes: Reflexes normal.     Reflex Scores:      Patellar reflexes are 2+ on the right side and 2+ on the left side.    LABS:  Results for orders placed or performed during the hospital encounter of 06/13/19  POCT urinalysis dipstick  Result Value Ref Range   Color, UA yellow yellow   Clarity, UA clear clear   Glucose, UA negative negative mg/dL   Bilirubin, UA negative negative   Ketones, POC UA negative negative mg/dL   Spec Grav, UA >=7.124 (A) 1.010 - 1.025   Blood, UA trace-intact (A) negative   pH, UA 5.5 5.0 - 8.0   Protein Ur, POC negative negative mg/dL   Urobilinogen, UA 0.2 0.2 or 1.0 E.U./dL   Nitrite, UA Negative Negative   Leukocytes, UA Negative Negative  POCT urine pregnancy  Result Value Ref Range   Preg Test, Ur Negative Negative    Labs Reviewed  POCT URINALYSIS DIP (MANUAL ENTRY) - Abnormal; Notable for the following components:      Result Value   Spec Grav, UA >=1.030 (*)    Blood, UA trace-intact (*)    All other components within normal limits  POCT URINE PREGNANCY  CERVICOVAGINAL ANCILLARY ONLY    ASSESSMENT & PLAN:  1. Screening for STD (sexually transmitted disease)   2. Dizziness    Patient is stable at discharge.  Vaginal irritation  and fishy odor is likely from bacterial vaginosis.  Will prescribe metronidazole and Diflucan.  Meds ordered this encounter  Medications   metroNIDAZOLE (FLAGYL) 500 MG tablet    Sig: Take 1 tablet (500 mg total) by mouth 2 (two) times daily.    Dispense:  14 tablet    Refill:  0   fluconazole (DIFLUCAN) 150 MG tablet    Sig: Take 1 tablet (150 mg total) by mouth daily.    Dispense:  1 tablet    Refill:  0    Pending: Labs Reviewed  POCT URINALYSIS  DIP (MANUAL ENTRY) - Abnormal; Notable for the following components:      Result Value   Spec Grav, UA >=1.030 (*)    Blood, UA trace-intact (*)    All other components within normal limits  POCT URINE PREGNANCY  CERVICOVAGINAL ANCILLARY ONLY     Discharge instructions Continue to take Zofran for dizziness.  May use OTC Dramamine  Vaginal self-swab obtained.  We will follow up with you regarding abnormal results Take medications as prescribed and to completion If tests results are positive, please abstain from sexual activity until you and your partner(s) have been treated Follow up with PCP or [facility] if symptoms persists Return here or go to ER if you have any new or worsening symptoms fever, chills, nausea, vomiting, abdominal or pelvic pain, painful intercourse, vaginal discharge, vaginal bleeding, persistent symptoms despite treatment, etc...  Reviewed expectations re: course of current medical issues. Questions answered. Outlined signs and symptoms indicating need for more acute intervention. Patient verbalized understanding. After Visit Summary given.       Emerson Monte, FNP 06/13/19 1937

## 2019-06-13 NOTE — ED Triage Notes (Signed)
Pt c/o dizziness since yesterday. Has history of vertigo, but not taking anything at home.   Pt also c/o vaginal pain/itching x 2 days. Took Diflucan 1 dose 2 days ago.

## 2019-06-13 NOTE — Discharge Instructions (Addendum)
Continue to take Zofran for dizziness.  May use OTC Dramamine  Vaginal self-swab obtained.  We will follow up with you regarding abnormal results Take medications as prescribed and to completion If tests results are positive, please abstain from sexual activity until you and your partner(s) have been treated Follow up with PCP or [facility] if symptoms persists Return here or go to ER if you have any new or worsening symptoms fever, chills, nausea, vomiting, abdominal or pelvic pain, painful intercourse, vaginal discharge, vaginal bleeding, persistent symptoms despite treatment, etc..Marland Kitchen

## 2019-06-14 ENCOUNTER — Telehealth (HOSPITAL_COMMUNITY): Payer: Self-pay | Admitting: Orthopedic Surgery

## 2019-06-14 ENCOUNTER — Encounter: Payer: Self-pay | Admitting: Emergency Medicine

## 2019-06-14 LAB — CERVICOVAGINAL ANCILLARY ONLY
Bacterial Vaginitis (gardnerella): POSITIVE — AB
Candida Glabrata: NEGATIVE
Candida Vaginitis: POSITIVE — AB
Chlamydia: NEGATIVE
Comment: NEGATIVE
Comment: NEGATIVE
Comment: NEGATIVE
Comment: NEGATIVE
Comment: NEGATIVE
Comment: NORMAL
Neisseria Gonorrhea: NEGATIVE
Trichomonas: NEGATIVE

## 2019-06-14 NOTE — Telephone Encounter (Signed)
Pt notified of lab results.   Pt is requesting a work note for another day, she said "she is still feeling dizzy".   Nena Jordan FNP, notified.  Per Nena Jordan FNP he will give her work note for tomorrow. Pt notified and states she will pick it up.

## 2019-06-18 ENCOUNTER — Ambulatory Visit
Admission: EM | Admit: 2019-06-18 | Discharge: 2019-06-18 | Disposition: A | Discharge status: Home / Self Care | Payer: BC Managed Care – PPO | Attending: Emergency Medicine | Admitting: Emergency Medicine

## 2019-06-18 ENCOUNTER — Encounter: Payer: Self-pay | Admitting: Emergency Medicine

## 2019-06-18 DIAGNOSIS — Z760 Encounter for issue of repeat prescription: Secondary | ICD-10-CM | POA: Diagnosis not present

## 2019-06-18 DIAGNOSIS — Z113 Encounter for screening for infections with a predominantly sexual mode of transmission: Secondary | ICD-10-CM | POA: Diagnosis not present

## 2019-06-18 DIAGNOSIS — B379 Candidiasis, unspecified: Secondary | ICD-10-CM

## 2019-06-18 DIAGNOSIS — B9689 Other specified bacterial agents as the cause of diseases classified elsewhere: Secondary | ICD-10-CM | POA: Diagnosis not present

## 2019-06-18 DIAGNOSIS — N760 Acute vaginitis: Secondary | ICD-10-CM

## 2019-06-18 DIAGNOSIS — J4520 Mild intermittent asthma, uncomplicated: Secondary | ICD-10-CM | POA: Diagnosis not present

## 2019-06-18 DIAGNOSIS — R42 Dizziness and giddiness: Secondary | ICD-10-CM

## 2019-06-18 MED ORDER — MECLIZINE HCL 50 MG PO TABS: 50.0000 mg | ORAL_TABLET | Freq: Three times a day (TID) | ORAL | 0 refills | Status: DC | PRN

## 2019-06-18 MED ORDER — METFORMIN HCL 500 MG PO TABS: 500.0000 mg | ORAL_TABLET | Freq: Two times a day (BID) | ORAL | 0 refills | Status: DC

## 2019-06-18 MED ORDER — OMEPRAZOLE 20 MG PO CPDR: 20.0000 mg | DELAYED_RELEASE_CAPSULE | Freq: Two times a day (BID) | ORAL | 0 refills | Status: DC

## 2019-06-18 MED ORDER — CETIRIZINE HCL 10 MG PO TABS: 10.0000 mg | ORAL_TABLET | Freq: Every day | ORAL | 0 refills | Status: DC

## 2019-06-18 MED ORDER — MONTELUKAST SODIUM 10 MG PO TABS: 10.0000 mg | ORAL_TABLET | Freq: Every day | ORAL | 1 refills | Status: AC

## 2019-06-18 NOTE — ED Provider Notes (Signed)
[facility]   [id number] 06/18/19 Arrival Time: 1459  Chief Complaint  Patient presents with  . Dizziness    SUBJECTIVE:  Kristen Price is a 44 y.o. female who presents with complaint of dizziness for the past few days.  Denies a precipitating event, trauma, or recent URI within the past month.  Describes the dizziness as "the room spinning." States that it is  intermittent with episodes lasting 1  minutes to hours.  Has tried Zofran without relief.  Symptoms made worse with movement.  Denies previous symptoms.  Denies fever, chills, nausea, vomiting, hearing changes, tinnitus, ear pain, chest pain, syncope, SOB, weakness, slurred speech, memory or emotional changes, facial drooping/ asymmetry, incoordination, numbness or tingling, abdominal pain, changes in bowel or bladder habits.    Patient would also like to have another STD screening completed as well as have her medication refill   ROS: As per HPI.  All other pertinent ROS negative.    Past Medical History:  Diagnosis Date  . Anxiety   . Asthma   . Chronic lower back pain   . Depression   . Diabetes (HCC) 2019  . Family history of adverse reaction to anesthesia    "mom didn't want to wake up at all"  . GERD (gastroesophageal reflux disease)   . Hiatal hernia   . Hypertension   . NSTEMI (non-ST elevated myocardial infarction) (HCC) 06/06/2014   stent to circumfkex  . Obesity    Past Surgical History:  Procedure Laterality Date  . CARDIAC CATHETERIZATION N/A 06/07/2014   Procedure: Left Heart Cath and Coronary Angiography;  Surgeon: Lennette Bihari, MD;  Location: [facility] INVASIVE CV LAB;  Service: Cardiovascular;  Laterality: N/A;  . CHOLECYSTECTOMY  2019  . FEMUR FRACTURE SURGERY Left 2003   "metal rod runs from hip to knee"  . FRACTURE SURGERY    . HOODED EYE SURGERY     Allergies  Allergen Reactions  . Amoxicillin Nausea And Vomiting    Did it involve swelling of the face/tongue/throat, SOB, or low BP? No Did  it involve sudden or severe rash/hives, skin peeling, or any reaction on the inside of your mouth or nose? No Did you need to seek medical attention at a hospital or doctor's office? No When did it last happen? If all above answers are "NO", may proceed with cephalosporin use.   . Augmentin [Amoxicillin-Pot Clavulanate] Nausea And Vomiting  . Benadryl [Diphenhydramine] Other (See Comments)    Patient stated that it makes her "go nuts"  . Losartan Cough  . Penicillins Nausea And Vomiting   No current facility-administered medications on file prior to encounter.   Current Outpatient Medications on File Prior to Encounter  Medication Sig Dispense Refill  . albuterol (VENTOLIN HFA) 108 (90 Base) MCG/ACT inhaler Inhale 1-2 puffs into the lungs every 6 (six) hours as needed for wheezing or shortness of breath. 18 g 0  . aspirin EC 81 MG EC tablet Take 1 tablet (81 mg total) by mouth daily.    Marland Kitchen atorvastatin (LIPITOR) 80 MG tablet Take 1 tablet (80 mg total) by mouth daily. 90 tablet 0  . budesonide-formoterol (SYMBICORT) 160-4.5 MCG/ACT inhaler Inhale 2 puffs into the lungs 2 (two) times daily. 1 Inhaler 3  . fluconazole (DIFLUCAN) 150 MG tablet Take 1 tablet (150 mg total) by mouth daily. 1 tablet 0  . fluticasone (FLONASE) 50 MCG/ACT nasal spray Place 1 spray into both nostrils daily for 14 days. 16 g 0  . ipratropium-albuterol (  DUONEB) 0.5-2.5 (3) MG/3ML SOLN Take 3 mLs by nebulization every 4 (four) hours as needed. 360 mL 0  . metroNIDAZOLE (FLAGYL) 500 MG tablet Take 1 tablet (500 mg total) by mouth 2 (two) times daily. 14 tablet 0  . Nebulizers (CLEVER CHOICE NEBULIZER) MISC 1 each by Does not apply route as needed (Use nebulizer as needed for wheezing and cough). 1 each 0  . nitroGLYCERIN (NITROSTAT) 0.4 MG SL tablet Place 1 tablet (0.4 mg total) under the tongue every 5 (five) minutes x 3 doses as needed for chest pain. (Patient not taking: Reported on 01/10/2019) 25 tablet 12  .  ondansetron (ZOFRAN ODT) 4 MG disintegrating tablet Take 1 tablet (4 mg total) by mouth every 8 (eight) hours as needed for nausea or vomiting. 20 tablet 0   Social History   Socioeconomic History  . Marital status: Divorced    Spouse name: Not on file  . Number of children: 1  . Years of education: Not on file  . Highest education level: Not on file  Occupational History  . Occupation: Works from home for [employer]  . Smoking status: Former Smoker    Packs/day: 1.50    Years: 30.00    Pack years: 45.00    Types: Cigarettes  . Smokeless tobacco: Never Used  . Tobacco comment: she uses vapor 6mg  no tobacco  Vaping Use  . Vaping Use: Every day  Substance and Sexual Activity  . Alcohol use: Yes    Alcohol/week: 0.0 standard drinks    Comment: occasionally  . Drug use: No  . Sexual activity: Not Currently    Birth control/protection: Implant    Comment: Nexplanon   Other Topics Concern  . Not on file  Social History Narrative   Caffeine intake: "Mountain dew nut" can drink an 18 pack in 2-3 days   Exercise: No   Social Determinants of Health   Financial Resource Strain:   . Difficulty of Paying Living Expenses:   Food Insecurity:   . Worried About in the Last Year:   . Programme researcher, broadcasting/film/video in the Last Year:   Transportation Needs:   . Barista (Medical):   Freight forwarder Lack of Transportation (Non-Medical):   Physical Activity:   . Days of Exercise per Week:   . Minutes of Exercise per Session:   Stress:   . Feeling of Stress :   Social Connections:   . Frequency of Communication with Friends and Family:   . Frequency of Social Gatherings with Friends and Family:   . Attends Religious Services:   . Active Member of Clubs or Organizations:   . Attends Marland Kitchen Meetings:   Banker Marital Status:   Intimate Partner Violence:   . Fear of Current or Ex-Partner:   . Emotionally Abused:   Marland Kitchen Physically Abused:   . Sexually Abused:     Family History  Problem Relation Age of Onset  . Coronary artery disease Mother 24       MI  . Heart attack Mother   . Hypertension Mother   . Diabetes Mother   . Asthma Mother   . Coronary artery disease Father 59       MI  . Heart attack Father   . Hypertension Father   . Stroke Neg Hx     OBJECTIVE:  Vitals:   06/18/19 1538  Weight: 235 lb 3.2 oz (106.7 kg)  Height: 5\' 7"  (1.702  m)    General appearance: alert; no distress Eyes: PERRLA; EOMI; conjunctiva normal HENT: normocephalic; atraumatic; TMs normal; nasal mucosa normal; oral mucosa normal Hallpike: negative Neck: supple with FROM Lungs: clear to auscultation bilaterally Heart: regular rate and rhythm Abdomen: soft, non-tender; bowel sounds normal Extremities: no cyanosis or edema; symmetrical with no gross deformities Skin: warm and dry Neurologic: normal gait; normal symmetric reflexes; CN 2-12 grossly intact Psychological: alert and cooperative; normal mood and affect  Labs:  No results found for this or any previous visit (from the past 24 hour(s)). Orders placed or performed in visit on 02/22/18  . EKG 12-Lead    No results found.  ASSESSMENT & PLAN:  1. Dizziness   2. Yeast infection   3. Screening for STD (sexually transmitted disease)   4. BV (bacterial vaginosis)   5. Encounter for medication refill   6. Mild intermittent asthma without complication   7. Medication refill    Patient reports she is unable to work at this time due to her dizziness.  Work note was given.  Treatment for BV and yeast infection has been completed.  She would like to have another STD screening completed to make sure she is cleared.  Meds ordered this encounter  Medications  . meclizine (ANTIVERT) 50 MG tablet    Sig: Take 1 tablet (50 mg total) by mouth 3 (three) times daily as needed.    Dispense:  30 tablet    Refill:  0  . cetirizine (ZYRTEC ALLERGY) 10 MG tablet    Sig: Take 1 tablet (10 mg total) by  mouth daily.    Dispense:  30 tablet    Refill:  0  . montelukast (SINGULAIR) 10 MG tablet    Sig: Take 1 tablet (10 mg total) by mouth at bedtime. Patient needs office visit    Dispense:  90 tablet    Refill:  1  . metFORMIN (GLUCOPHAGE) 500 MG tablet    Sig: Take 1 tablet (500 mg total) by mouth 2 (two) times daily with a meal.    Dispense:  180 tablet    Refill:  0    NEED PCP APPOINTMENT.  Marland Kitchen omeprazole (PRILOSEC) 20 MG capsule    Sig: Take 1 capsule (20 mg total) by mouth in the morning and at bedtime.    Dispense:  60 capsule    Refill:  0    Discharge instructions  Meclizine was prescribed for dizziness Singulair and Zyrtec were refilled Metformin and omeprazole were also refilled Was advised to follow-up with PCP Return or go to ED for worsening symptoms  Reviewed expectations re: course of current medical issues. Questions answered. Outlined signs and symptoms indicating need for more acute intervention. Patient verbalized understanding. After Visit Summary given.    Emerson Monte, [place] 06/18/19 [phone]

## 2019-06-18 NOTE — ED Triage Notes (Addendum)
Dizziness and nausea since last Wednesday.  Seen at urgent care and told to take dramamine.  States she was feeling better then it got worse today.  Pt states she has also been treated for yeast infection and bv and would like to make sure that is cleared up.  Also would like refill on omeprazole and metformin.

## 2019-06-18 NOTE — Discharge Instructions (Addendum)
Meclizine was prescribed for dizziness Singulair and Zyrtec were refilled Metformin and omeprazole were also refilled Was advised to follow-up with PCP Return or go to ED for worsening symptoms

## 2019-06-19 LAB — CERVICOVAGINAL ANCILLARY ONLY
Bacterial Vaginitis (gardnerella): NEGATIVE
Candida Glabrata: NEGATIVE
Candida Vaginitis: NEGATIVE
Chlamydia: NEGATIVE
Comment: NEGATIVE
Comment: NEGATIVE
Comment: NEGATIVE
Comment: NEGATIVE
Comment: NEGATIVE
Comment: NORMAL
Neisseria Gonorrhea: NEGATIVE
Trichomonas: NEGATIVE

## 2019-08-15 ENCOUNTER — Ambulatory Visit
Admission: EM | Admit: 2019-08-15 | Discharge: 2019-08-15 | Disposition: A | Discharge status: Home / Self Care | Payer: BC Managed Care – PPO | Attending: Emergency Medicine | Admitting: Emergency Medicine

## 2019-08-15 ENCOUNTER — Other Ambulatory Visit: Payer: Self-pay

## 2019-08-15 ENCOUNTER — Encounter: Payer: Self-pay | Admitting: Emergency Medicine

## 2019-08-15 DIAGNOSIS — N390 Urinary tract infection, site not specified: Secondary | ICD-10-CM | POA: Diagnosis not present

## 2019-08-15 DIAGNOSIS — R350 Frequency of micturition: Secondary | ICD-10-CM | POA: Diagnosis not present

## 2019-08-15 DIAGNOSIS — Z79899 Other long term (current) drug therapy: Secondary | ICD-10-CM | POA: Diagnosis not present

## 2019-08-15 DIAGNOSIS — Z113 Encounter for screening for infections with a predominantly sexual mode of transmission: Secondary | ICD-10-CM | POA: Insufficient documentation

## 2019-08-15 DIAGNOSIS — R319 Hematuria, unspecified: Secondary | ICD-10-CM | POA: Insufficient documentation

## 2019-08-15 DIAGNOSIS — Z7984 Long term (current) use of oral hypoglycemic drugs: Secondary | ICD-10-CM | POA: Diagnosis not present

## 2019-08-15 DIAGNOSIS — J4521 Mild intermittent asthma with (acute) exacerbation: Secondary | ICD-10-CM | POA: Diagnosis not present

## 2019-08-15 DIAGNOSIS — N3001 Acute cystitis with hematuria: Secondary | ICD-10-CM

## 2019-08-15 DIAGNOSIS — Z7982 Long term (current) use of aspirin: Secondary | ICD-10-CM | POA: Insufficient documentation

## 2019-08-15 DIAGNOSIS — Z760 Encounter for issue of repeat prescription: Secondary | ICD-10-CM | POA: Insufficient documentation

## 2019-08-15 DIAGNOSIS — E119 Type 2 diabetes mellitus without complications: Secondary | ICD-10-CM | POA: Insufficient documentation

## 2019-08-15 DIAGNOSIS — R300 Dysuria: Secondary | ICD-10-CM | POA: Diagnosis not present

## 2019-08-15 DIAGNOSIS — Z87891 Personal history of nicotine dependence: Secondary | ICD-10-CM | POA: Diagnosis not present

## 2019-08-15 LAB — POCT URINE PREGNANCY: Preg Test, Ur: NEGATIVE

## 2019-08-15 LAB — POCT URINALYSIS DIP (MANUAL ENTRY)
Glucose, UA: 250 mg/dL — AB
Nitrite, UA: POSITIVE — AB
Protein Ur, POC: >=300 mg/dL — AB
Spec Grav, UA: 1.015 (ref 1.010–1.025)
Urobilinogen, UA: >=8 E.U./dL — AB
pH, UA: 5 (ref 5.0–8.0)

## 2019-08-15 MED ORDER — DEXAMETHASONE SODIUM PHOSPHATE 10 MG/ML IJ SOLN
10.0000 mg | Freq: Once | INTRAMUSCULAR | Status: AC
  Administered 2019-08-15: 10 mg via INTRAMUSCULAR

## 2019-08-15 MED ORDER — OMEPRAZOLE 20 MG PO CPDR: 20.0000 mg | DELAYED_RELEASE_CAPSULE | Freq: Two times a day (BID) | ORAL | 1 refills | Status: AC

## 2019-08-15 MED ORDER — CETIRIZINE HCL 10 MG PO TABS: 10.0000 mg | ORAL_TABLET | Freq: Every day | ORAL | 1 refills | Status: DC

## 2019-08-15 MED ORDER — PREDNISONE 10 MG (21) PO TBPK: ORAL_TABLET | Freq: Every day | ORAL | 0 refills | Status: DC

## 2019-08-15 MED ORDER — NITROFURANTOIN MONOHYD MACRO 100 MG PO CAPS: 100.0000 mg | ORAL_CAPSULE | Freq: Two times a day (BID) | ORAL | 0 refills | Status: DC

## 2019-08-15 MED ORDER — ALBUTEROL SULFATE HFA 108 (90 BASE) MCG/ACT IN AERS
2.0000 | INHALATION_SPRAY | Freq: Once | RESPIRATORY_TRACT | Status: AC
  Administered 2019-08-15: 2 via RESPIRATORY_TRACT

## 2019-08-15 MED ORDER — FLUCONAZOLE 200 MG PO TABS: ORAL_TABLET | ORAL | 0 refills | Status: DC

## 2019-08-15 NOTE — Discharge Instructions (Signed)
Urine positive for UTI Urine culture sent.  We will call you with abnormal results.   Vaginal swab obtained.  We will call you with abnormal results.  Please abstain from sexual activity until you have received your resutls Push fluids and get plenty of rest.   Take antibiotic as directed and to completion Diflucan prescribed for possible yeast infection Follow up with PCP if symptoms persists Return here or go to ER if you have any new or worsening symptoms such as fever, worsening abdominal pain, nausea/vomiting, flank pain, etc...  Proair inhaler given.  Use as directed Steroid shot given in office Take steroid as prescribed and to completion Follow up with PCP  Return here or go to ER if you have any new or worsening symptoms such as shortness of breath, difficulty breathing, accessory muscle use, rib retraction, or if symptoms do not improve with medication   Medications refilled for acid reflux and allergies

## 2019-08-15 NOTE — ED Triage Notes (Addendum)
Urinary frequency and burning would like std as well

## 2019-08-15 NOTE — ED Provider Notes (Signed)
[facility]   CC: Burning with urination; concern for STD; asthma flare; medication refill  SUBJECTIVE:  Kristen Price is a 44 y.o. female who complains of urinary frequency, and dysuria for the past 3 days.  Reports unprotected sex prior to symptoms.  Requests STD testing.  Unknown if partner with symptoms.  Has been sexually active with 1 female partner over the past 6 months.  Denies abdominal pain.  Has tried OTC AZO without relief.  Symptoms are made worse with urination.  Admits to similar symptoms in the past.    Also reports asthma flare x 2 days.  Reports cough, SOB, and wheezing.  Symptoms related to allergies, season changes.  Reports similar symptoms in the past that improved with steroid and steroid shot.    Also requests medication refills for acid reflux and allergies.  Does not have a PCP.    Denies fever, chills, nausea, vomiting, chest pain, runny nose, congestion, abdominal pain, flank pain, abnormal vaginal discharge, vaginal odor, vaginal itching, hematuria.    LMP: No LMP recorded.  ROS: As in HPI.  All other pertinent ROS negative.     Past Medical History:  Diagnosis Date  . Anxiety   . Asthma   . Chronic lower back pain   . Depression   . Diabetes (HCC) 2019  . Family history of adverse reaction to anesthesia    "mom didn't want to wake up at all"  . GERD (gastroesophageal reflux disease)   . Hiatal hernia   . Hypertension   . NSTEMI (non-ST elevated myocardial infarction) (HCC) 06/06/2014   stent to circumfkex  . Obesity    Past Surgical History:  Procedure Laterality Date  . CARDIAC CATHETERIZATION N/A 06/07/2014   Procedure: Left Heart Cath and Coronary Angiography;  Surgeon: Lennette Bihari, MD;  Location: [facility] INVASIVE CV LAB;  Service: Cardiovascular;  Laterality: N/A;  . CHOLECYSTECTOMY  2019  . FEMUR FRACTURE SURGERY Left 2003   "metal rod runs from hip to knee"  . FRACTURE SURGERY    . HOODED EYE SURGERY     Allergies  Allergen  Reactions  . Amoxicillin Nausea And Vomiting    Did it involve swelling of the face/tongue/throat, SOB, or low BP? No Did it involve sudden or severe rash/hives, skin peeling, or any reaction on the inside of your mouth or nose? No Did you need to seek medical attention at a hospital or doctor's office? No When did it last happen? If all above answers are "NO", may proceed with cephalosporin use.   . Augmentin [Amoxicillin-Pot Clavulanate] Nausea And Vomiting  . Benadryl [Diphenhydramine] Other (See Comments)    Patient stated that it makes her "go nuts"  . Losartan Cough  . Penicillins Nausea And Vomiting   No current facility-administered medications on file prior to encounter.   Current Outpatient Medications on File Prior to Encounter  Medication Sig Dispense Refill  . albuterol (VENTOLIN HFA) 108 (90 Base) MCG/ACT inhaler Inhale 1-2 puffs into the lungs every 6 (six) hours as needed for wheezing or shortness of breath. 18 g 0  . aspirin EC 81 MG EC tablet Take 1 tablet (81 mg total) by mouth daily.    Marland Kitchen atorvastatin (LIPITOR) 80 MG tablet Take 1 tablet (80 mg total) by mouth daily. 90 tablet 0  . budesonide-formoterol (SYMBICORT) 160-4.5 MCG/ACT inhaler Inhale 2 puffs into the lungs 2 (two) times daily. 1 Inhaler 3  . fluticasone (FLONASE) 50 MCG/ACT nasal spray Place 1 spray into  both nostrils daily for 14 days. 16 g 0  . ipratropium-albuterol (DUONEB) 0.5-2.5 (3) MG/3ML SOLN Take 3 mLs by nebulization every 4 (four) hours as needed. 360 mL 0  . meclizine (ANTIVERT) 50 MG tablet Take 1 tablet (50 mg total) by mouth 3 (three) times daily as needed. 30 tablet 0  . metFORMIN (GLUCOPHAGE) 500 MG tablet Take 1 tablet (500 mg total) by mouth 2 (two) times daily with a meal. 180 tablet 0  . montelukast (SINGULAIR) 10 MG tablet Take 1 tablet (10 mg total) by mouth at bedtime. Patient needs office visit 90 tablet 1  . Nebulizers (CLEVER CHOICE NEBULIZER) MISC 1 each by Does not apply  route as needed (Use nebulizer as needed for wheezing and cough). 1 each 0  . nitroGLYCERIN (NITROSTAT) 0.4 MG SL tablet Place 1 tablet (0.4 mg total) under the tongue every 5 (five) minutes x 3 doses as needed for chest pain. (Patient not taking: Reported on 01/10/2019) 25 tablet 12  . ondansetron (ZOFRAN ODT) 4 MG disintegrating tablet Take 1 tablet (4 mg total) by mouth every 8 (eight) hours as needed for nausea or vomiting. 20 tablet 0   Social History   Socioeconomic History  . Marital status: Divorced    Spouse name: Not on file  . Number of children: 1  . Years of education: Not on file  . Highest education level: Not on file  Occupational History  . Occupation: Works from home for [employer]  . Smoking status: Former Smoker    Packs/day: 1.50    Years: 30.00    Pack years: 45.00    Types: Cigarettes  . Smokeless tobacco: Never Used  . Tobacco comment: she uses vapor 6mg  no tobacco  Vaping Use  . Vaping Use: Every day  Substance and Sexual Activity  . Alcohol use: Yes    Alcohol/week: 0.0 standard drinks    Comment: occasionally  . Drug use: No  . Sexual activity: Not Currently    Birth control/protection: Implant    Comment: Nexplanon   Other Topics Concern  . Not on file  Social History Narrative   Caffeine intake: "Mountain dew nut" can drink an 18 pack in 2-3 days   Exercise: No   Social Determinants of Health   Financial Resource Strain:   . Difficulty of Paying Living Expenses:   Food Insecurity:   . Worried About in the Last Year:   . Programme researcher, broadcasting/film/video in the Last Year:   Transportation Needs:   . Barista (Medical):   Freight forwarder Lack of Transportation (Non-Medical):   Physical Activity:   . Days of Exercise per Week:   . Minutes of Exercise per Session:   Stress:   . Feeling of Stress :   Social Connections:   . Frequency of Communication with Friends and Family:   . Frequency of Social Gatherings with Friends and  Family:   . Attends Religious Services:   . Active Member of Clubs or Organizations:   . Attends Marland Kitchen Meetings:   Banker Marital Status:   Intimate Partner Violence:   . Fear of Current or Ex-Partner:   . Emotionally Abused:   Marland Kitchen Physically Abused:   . Sexually Abused:    Family History  Problem Relation Age of Onset  . Coronary artery disease Mother 20       MI  . Heart attack Mother   . Hypertension Mother   .  Diabetes Mother   . Asthma Mother   . Coronary artery disease Father [id number]       MI  . Heart attack Father   . Hypertension Father   . Stroke Neg Hx     OBJECTIVE:  Vitals:   08/15/19 1741  BP: (!) 161/85  Pulse: (!) 106  Resp: 17  Temp: 98.2 F (36.8 C)  TempSrc: Oral  SpO2: 95%   General appearance: Alert in no acute distress HEENT: NCAT.  Oropharynx clear.  Lungs: clear to auscultation bilaterally without adventitious breath sounds Heart: regular rate and rhythm.   Abdomen: soft; non-distended; no tenderness; bowel sounds present; no guarding or rebound tenderness Extremities: no edema; symmetrical with no gross deformities Skin: warm and dry Neurologic: Ambulates from chair to exam table without difficulty Psychological: alert and cooperative; normal mood and affect  Labs Reviewed  POCT URINALYSIS DIP (MANUAL ENTRY) - Abnormal; Notable for the following components:      Result Value   Color, UA orange (*)    Clarity, UA cloudy (*)    Glucose, UA =250 (*)    Bilirubin, UA small (*)    Ketones, POC UA small (15) (*)    Blood, UA small (*)    Protein Ur, POC >=300 (*)    Urobilinogen, UA >=8.0 (*)    Nitrite, UA Positive (*)    Leukocytes, UA Large (3+) (*)    All other components within normal limits  URINE CULTURE  POCT URINE PREGNANCY  CERVICOVAGINAL ANCILLARY ONLY    ASSESSMENT & PLAN:  1. Dysuria   2. Acute cystitis with hematuria   3. Mild intermittent asthma with exacerbation   4. Medication refill     Meds ordered this  encounter  Medications  . dexamethasone (DECADRON) injection 10 mg  . albuterol (VENTOLIN HFA) 108 (90 Base) MCG/ACT inhaler 2 puff  . nitrofurantoin, macrocrystal-monohydrate, (MACROBID) 100 MG capsule    Sig: Take 1 capsule (100 mg total) by mouth 2 (two) times daily.    Dispense:  10 capsule    Refill:  0    Order Specific Question:   Supervising Provider    Answer:   Eustace MooreNELSON, YVONNE SUE [[id number]]  . predniSONE (STERAPRED UNI-PAK 21 TAB) 10 MG (21) TBPK tablet    Sig: Take by mouth daily. Take 6 tabs by mouth daily  for 2 days, then 5 tabs for 2 days, then 4 tabs for 2 days, then 3 tabs for 2 days, 2 tabs for 2 days, then 1 tab by mouth daily for 2 days    Dispense:  42 tablet    Refill:  0    Order Specific Question:   Supervising Provider    Answer:   Eustace MooreNELSON, YVONNE SUE [[id number]]  . fluconazole (DIFLUCAN) 200 MG tablet    Sig: Take one dose by mouth, wait 72 hours, and then take second dose by mouth    Dispense:  2 tablet    Refill:  0    Order Specific Question:   Supervising Provider    Answer:   Eustace MooreNELSON, YVONNE SUE [[id number]]  . omeprazole (PRILOSEC) 20 MG capsule    Sig: Take 1 capsule (20 mg total) by mouth in the morning and at bedtime.    Dispense:  60 capsule    Refill:  1    Order Specific Question:   Supervising Provider    Answer:   Eustace MooreNELSON, YVONNE SUE [[id number]]  . cetirizine (ZYRTEC ALLERGY) 10 MG tablet  Sig: Take 1 tablet (10 mg total) by mouth daily.    Dispense:  30 tablet    Refill:  1    Order Specific Question:   Supervising Provider    Answer:   Eustace Moore [[id number]]   Urine positive for UTI Urine culture sent.  We will call you with abnormal results.   Vaginal swab obtained.  We will call you with abnormal results.  Please abstain from sexual activity until you have received your resutls Push fluids and get plenty of rest.   Take antibiotic as directed and to completion Diflucan prescribed for possible yeast infection Follow up with PCP if  symptoms persists Return here or go to ER if you have any new or worsening symptoms such as fever, worsening abdominal pain, nausea/vomiting, flank pain, etc...  Proair inhaler given.  Use as directed Steroid shot given in office Take steroid as prescribed and to completion Follow up with PCP  Return here or go to ER if you have any new or worsening symptoms such as shortness of breath, difficulty breathing, accessory muscle use, rib retraction, or if symptoms do not improve with medication   Medications refilled for acid reflux and allergies  Outlined signs and symptoms indicating need for more acute intervention. Patient verbalized understanding. After Visit Summary given.     Rennis Harding, PA-C 08/15/19 1806

## 2019-08-16 ENCOUNTER — Telehealth: Payer: Self-pay

## 2019-08-16 LAB — CERVICOVAGINAL ANCILLARY ONLY
Bacterial Vaginitis (gardnerella): POSITIVE — AB
Candida Glabrata: NEGATIVE
Candida Vaginitis: POSITIVE — AB
Chlamydia: NEGATIVE
Comment: NEGATIVE
Comment: NEGATIVE
Comment: NEGATIVE
Comment: NEGATIVE
Comment: NEGATIVE
Comment: NORMAL
Neisseria Gonorrhea: NEGATIVE
Trichomonas: NEGATIVE

## 2019-08-16 MED ORDER — METRONIDAZOLE 500 MG PO TABS: 500.0000 mg | ORAL_TABLET | Freq: Two times a day (BID) | ORAL | 0 refills | Status: DC

## 2019-08-17 ENCOUNTER — Telehealth: Payer: Self-pay | Admitting: *Deleted

## 2019-08-17 ENCOUNTER — Encounter (INDEPENDENT_AMBULATORY_CARE_PROVIDER_SITE_OTHER): Payer: Self-pay | Admitting: Primary Care

## 2019-08-17 LAB — URINE CULTURE: Culture: >=100000 — AB

## 2019-08-17 NOTE — Telephone Encounter (Signed)
Rx for Nitrofurantoin Monohyd Macro 100 mg Oral 2 times daily given DOS, no changes in tx plan.  RX for Fluconazole 200 MG Take one dose by mouth, wait 72 hours, and then take second dose by mouth.   RX for Metronidazole 500mg  BID x 7 days was sent to pharmacy yesterday.   Spoke with patient and she has no questions.

## 2019-08-18 ENCOUNTER — Other Ambulatory Visit: Payer: Self-pay | Admitting: Physician Assistant

## 2019-08-28 ENCOUNTER — Ambulatory Visit
Admission: EM | Admit: 2019-08-28 | Discharge: 2019-08-28 | Disposition: A | Discharge status: Home / Self Care | Payer: BC Managed Care – PPO | Attending: Emergency Medicine | Admitting: Emergency Medicine

## 2019-08-28 DIAGNOSIS — Z1152 Encounter for screening for COVID-19: Secondary | ICD-10-CM | POA: Diagnosis not present

## 2019-08-28 DIAGNOSIS — J4521 Mild intermittent asthma with (acute) exacerbation: Secondary | ICD-10-CM

## 2019-08-28 DIAGNOSIS — Z20822 Contact with and (suspected) exposure to covid-19: Secondary | ICD-10-CM | POA: Diagnosis not present

## 2019-08-28 MED ORDER — PREDNISONE 10 MG (21) PO TBPK: ORAL_TABLET | ORAL | 0 refills | Status: DC

## 2019-08-28 MED ORDER — AZITHROMYCIN 250 MG PO TABS: 250.0000 mg | ORAL_TABLET | Freq: Every day | ORAL | 0 refills | Status: DC

## 2019-08-28 MED ORDER — FLUCONAZOLE 200 MG PO TABS: 200.0000 mg | ORAL_TABLET | Freq: Once | ORAL | 0 refills | Status: AC

## 2019-08-28 NOTE — Discharge Instructions (Addendum)
Azithromycin and prednisone were prescribed Continue to use Proair inhaler .  Use as directed Take steroid as prescribed and to completion Follow up with PCP next week Return here or go to ER if you have any new or worsening symptoms such as shortness of breath, difficulty breathing, accessory muscle use, rib retraction, or if symptoms do not improve with medication

## 2019-08-28 NOTE — ED Triage Notes (Signed)
Pt reports she is concerned she did not take all of her steroid pills for asthma after dropping them on the floor.   Concerned for COVID after being exposed over the weekend.   Concerned for continued UTI symptoms, complaining of dysuria.

## 2019-08-28 NOTE — ED Provider Notes (Signed)
[facility]   [id number] 08/28/19 Arrival Time: 1740   OF:[id number]  SUBJECTIVE: History from: patient.  Kristen Price is a 44 y.o. female who presents with complaint of intermittent chest tightness, productive cough with yellow and Weich sputum and wheezing. Triggers: cold symptoms, exposure to fumes and respiration. Onset gradual, approximately 11 days ago. Describes wheezing as mild when present. Fever: no. Overall normal PO intake without n/v. Sick contacts: no. Typically her asthma is well controlled. Denies fever, chills, nausea, vomiting, SOB, chest pain, abdominal pain, changes in bowel or bladder function.   Inhaler OIT:GPQDIY.   Received flu shot this year: yes.   ROS: As per HPI.  All other pertinent ROS negative.    Past Medical History:  Diagnosis Date  . Anxiety   . Asthma   . Chronic lower back pain   . Depression   . Diabetes (HCC) 2019  . Family history of adverse reaction to anesthesia    "mom didn't want to wake up at all"  . GERD (gastroesophageal reflux disease)   . Hiatal hernia   . Hypertension   . NSTEMI (non-ST elevated myocardial infarction) (HCC) 06/06/2014   stent to circumfkex  . Obesity    Past Surgical History:  Procedure Laterality Date  . CARDIAC CATHETERIZATION N/A 06/07/2014   Procedure: Left Heart Cath and Coronary Angiography;  Surgeon: Lennette Bihari, MD;  Location: [facility] INVAS[facility] CV LAB;  Service: Cardiovascular;  Laterality: N/A;  . CHOLECYSTECTOMY  2019  . FEMUR FRACTURE SURGERY Left 2003   "metal rod runs from hip to knee"  . FRACTURE SURGERY    . HOODED EYE SURGERY     Allergies  Allergen Reactions  . Amoxicillin Nausea And Vomiting    Did it involve swelling of the face/tongue/throat, SOB, or low BP? No Did it involve sudden or severe rash/hives, skin peeling, or any reaction on the inside of your mouth or nose? No Did you need to seek medical attention at a hospital or doctor's office? No When did it last  happen? If all above answers are "NO", may proceed with cephalosporin use.   . Augmentin [Amoxicillin-Pot Clavulanate] Nausea And Vomiting  . Benadryl [Diphenhydramine] Other (See Comments)    Patient stated that it makes her "go nuts"  . Losartan Cough  . Penicillins Nausea And Vomiting   No current facility-administered medications on file prior to encounter.   Current Outpatient Medications on File Prior to Encounter  Medication Sig Dispense Refill  . albuterol (VENTOLIN HFA) 108 (90 Base) MCG/ACT inhaler Inhale 1-2 puffs into the lungs every 6 (six) hours as needed for wheezing or shortness of breath. 18 g 0  . aspirin EC 81 MG EC tablet Take 1 tablet (81 mg total) by mouth daily.    Marland Kitchen atorvastatin (LIPITOR) 80 MG tablet Take 1 tablet (80 mg total) by mouth daily. Please keep upcoming appt in August with Dr. Clifton James before anymore refills. Thank you 30 tablet 0  . budesonide-formoterol (SYMBICORT) 160-4.5 MCG/ACT inhaler Inhale 2 puffs into the lungs 2 (two) times daily. 1 Inhaler 3  . cetirizine (ZYRTEC ALLERGY) 10 MG tablet Take 1 tablet (10 mg total) by mouth daily. 30 tablet 1  . fluconazole (DIFLUCAN) 200 MG tablet Take one dose by mouth, wait 72 hours, and then take second dose by mouth 2 tablet 0  . fluticasone (FLONASE) 50 MCG/ACT nasal spray Place 1 spray into both nostrils daily for 14 days. 16 g 0  . ipratropium-albuterol (DUONEB) 0.5-2.5 (3)  MG/3ML SOLN Take 3 mLs by nebulization every 4 (four) hours as needed. 360 mL 0  . meclizine (ANTIVERT) 50 MG tablet Take 1 tablet (50 mg total) by mouth 3 (three) times daily as needed. 30 tablet 0  . metFORMIN (GLUCOPHAGE) 500 MG tablet Take 1 tablet (500 mg total) by mouth 2 (two) times daily with a meal. 180 tablet 0  . metroNIDAZOLE (FLAGYL) 500 MG tablet Take 1 tablet (500 mg total) by mouth 2 (two) times daily. 14 tablet 0  . montelukast (SINGULAIR) 10 MG tablet Take 1 tablet (10 mg total) by mouth at bedtime. Patient needs  office visit 90 tablet 1  . Nebulizers (CLEVER CHOICE NEBULIZER) MISC 1 each by Does not apply route as needed (Use nebulizer as needed for wheezing and cough). 1 each 0  . nitrofurantoin, macrocrystal-monohydrate, (MACROBID) 100 MG capsule Take 1 capsule (100 mg total) by mouth 2 (two) times daily. 10 capsule 0  . nitroGLYCERIN (NITROSTAT) 0.4 MG SL tablet Place 1 tablet (0.4 mg total) under the tongue every 5 (five) minutes x 3 doses as needed for chest pain. (Patient not taking: Reported on 01/10/2019) 25 tablet 12  . omeprazole (PRILOSEC) 20 MG capsule Take 1 capsule (20 mg total) by mouth in the morning and at bedtime. 60 capsule 1  . ondansetron (ZOFRAN ODT) 4 MG disintegrating tablet Take 1 tablet (4 mg total) by mouth every 8 (eight) hours as needed for nausea or vomiting. 20 tablet 0   Social History   Socioeconomic History  . Marital status: Divorced    Spouse name: Not on file  . Number of children: 1  . Years of education: Not on file  . Highest education level: Not on file  Occupational History  . Occupation: Works from home for [employer]  . Smoking status: Former Smoker    Packs/day: 1.50    Years: 30.00    Pack years: 45.00    Types: Cigarettes  . Smokeless tobacco: Never Used  . Tobacco comment: she uses vapor 6mg  no tobacco  Vaping Use  . Vaping Use: Every day  Substance and Sexual Activity  . Alcohol use: Yes    Alcohol/week: 0.0 standard drinks    Comment: occasionally  . Drug use: No  . Sexual activity: Not Currently    Birth control/protection: Implant    Comment: Nexplanon   Other Topics Concern  . Not on file  Social History Narrative   Caffeine intake: "Mountain dew nut" can drink an 18 pack in 2-3 days   Exercise: No   Social Determinants of Health   Financial Resource Strain:   . Difficulty of Paying Living Expenses: Not on file  Food Insecurity:   . Worried About in the Last Year: Not on file  . Ran Out of Food in  the Last Year: Not on file  Transportation Needs:   . Lack of Transportation (Medical): Not on file  . Lack of Transportation (Non-Medical): Not on file  Physical Activity:   . Days of Exercise per Week: Not on file  . Minutes of Exercise per Session: Not on file  Stress:   . Feeling of Stress : Not on file  Social Connections:   . Frequency of Communication with Friends and Family: Not on file  . Frequency of Social Gatherings with Friends and Family: Not on file  . Attends Religious Services: Not on file  . Active Member of Clubs or Organizations: Not on  file  . Attends Banker Meetings: Not on file  . Marital Status: Not on file  Intimate Partner Violence:   . Fear of Current or Ex-Partner: Not on file  . Emotionally Abused: Not on file  . Physically Abused: Not on file  . Sexually Abused: Not on file   Family History  Problem Relation Age of Onset  . Coronary artery disease Mother 28       MI  . Heart attack Mother   . Hypertension Mother   . Diabetes Mother   . Asthma Mother   . Coronary artery disease Father 61       MI  . Heart attack Father   . Hypertension Father   . Stroke Neg Hx     OBJECTIVE:  Vitals:   08/28/19 1747  BP: 134/83  Pulse: 95  Resp: 16  Temp: 98.4 F (36.9 C)  TempSrc: Oral  SpO2: 96%     General appearance: alert; appears fatigued HEENT: nasal congestion; clear runny nose; throat irritation secondary to post-nasal drainage Neck: supple without LAD Lungs: unlabored respirations, mild bilateral wheezing; cough: mild; no significant respiratory distress Skin: warm and dry Psychological: alert and cooperative; normal mood and affect  Imaging: No results found.  ASSESSMENT & PLAN:  1. Mild intermittent asthma with acute exacerbation   2. Encounter for screening for COVID-19     Nebulizer treatment needed: no. Improvement: no.  Meds ordered this encounter  Medications  . predniSONE (STERAPRED UNI-PAK 21 TAB) 10  MG (21) TBPK tablet    Sig: Take 6 tabs by mouth daily  for 1 days, then 5 tabs for 1days, then 4 tabs for 1 days, then 3 tabs for 1 days, 2 tabs for 1 days, then 1 tab by mouth daily for 1 days    Dispense:  21 tablet    Refill:  0  . azithromycin (ZITHROMAX) 250 MG tablet    Sig: Take 1 tablet (250 mg total) by mouth daily. Take first 2 tablets together, then 1 every day until finished.    Dispense:  6 tablet    Refill:  0   Discharge instructions  Azithromycin and prednisone were prescribed Continue to use Proair inhaler .  Use as directed Take steroid as prescribed and to completion Follow up with PCP next week Return here or go to ER if you have any new or worsening symptoms such as shortness of breath, difficulty breathing, accessory muscle use, rib retraction, or if symptoms do not improve with medication    Reviewed expectations re: course of current medical issues. Questions answered. Outlined signs and symptoms indicating need for more acute intervention. Patient verbalized understanding. After Visit Summary given.       Note: This document was prepared using Dragon voice recognition software and may include unintentional dictation errors.    Durward Parcel, FNP 08/28/19 1846

## 2019-08-29 LAB — NOVEL CORONAVIRUS, NAA: SARS-CoV-2, NAA: NOT DETECTED

## 2019-08-29 LAB — SARS-COV-2, NAA 2 DAY TAT

## 2019-08-31 ENCOUNTER — Ambulatory Visit: Payer: BC Managed Care – PPO | Admitting: Cardiovascular Disease

## 2019-08-31 NOTE — Progress Notes (Deleted)
No chief complaint on file.  History of Present Illness:44 yo female with history of CAD, tobacco abuse, HTN, GERD, hiatal hernia and diabetes who is here today for cardiac follow up. She had a NSTEMI in June 2016 and had a drug eluting stent placed in the proximal Circumflex. Echo in 2016 with LVEF=60%.   She is here today for follow up. The patient denies any chest pain, dyspnea, palpitations, lower extremity edema, orthopnea, PND, dizziness, near syncope or syncope.   Primary Care Physician: Grayce Sessions, NP   Past Medical History:  Diagnosis Date  . Anxiety   . Asthma   . Chronic lower back pain   . Depression   . Diabetes (HCC) 2019  . Family history of adverse reaction to anesthesia    "mom didn't want to wake up at all"  . GERD (gastroesophageal reflux disease)   . Hiatal hernia   . Hypertension   . NSTEMI (non-ST elevated myocardial infarction) (HCC) 06/06/2014   stent to circumfkex  . Obesity     Past Surgical History:  Procedure Laterality Date  . CARDIAC CATHETERIZATION N/A 06/07/2014   Procedure: Left Heart Cath and Coronary Angiography;  Surgeon: Lennette Bihari, MD;  Location: [facility] INVASIVE CV LAB;  Service: Cardiovascular;  Laterality: N/A;  . CHOLECYSTECTOMY  2019  . FEMUR FRACTURE SURGERY Left 2003   "metal rod runs from hip to knee"  . FRACTURE SURGERY    . HOODED EYE SURGERY      Current Outpatient Medications  Medication Sig Dispense Refill  . albuterol (VENTOLIN HFA) 108 (90 Base) MCG/ACT inhaler Inhale 1-2 puffs into the lungs every 6 (six) hours as needed for wheezing or shortness of breath. 18 g 0  . aspirin EC 81 MG EC tablet Take 1 tablet (81 mg total) by mouth daily.    Marland Kitchen atorvastatin (LIPITOR) 80 MG tablet Take 1 tablet (80 mg total) by mouth daily. Please keep upcoming appt in August with Dr. Clifton James before anymore refills. Thank you 30 tablet 0  . azithromycin (ZITHROMAX) 250 MG tablet Take 1 tablet (250 mg total) by mouth daily. Take  first 2 tablets together, then 1 every day until finished. 6 tablet 0  . budesonide-formoterol (SYMBICORT) 160-4.5 MCG/ACT inhaler Inhale 2 puffs into the lungs 2 (two) times daily. 1 Inhaler 3  . cetirizine (ZYRTEC ALLERGY) 10 MG tablet Take 1 tablet (10 mg total) by mouth daily. 30 tablet 1  . fluticasone (FLONASE) 50 MCG/ACT nasal spray Place 1 spray into both nostrils daily for 14 days. 16 g 0  . ipratropium-albuterol (DUONEB) 0.5-2.5 (3) MG/3ML SOLN Take 3 mLs by nebulization every 4 (four) hours as needed. 360 mL 0  . meclizine (ANTIVERT) 50 MG tablet Take 1 tablet (50 mg total) by mouth 3 (three) times daily as needed. 30 tablet 0  . metFORMIN (GLUCOPHAGE) 500 MG tablet Take 1 tablet (500 mg total) by mouth 2 (two) times daily with a meal. 180 tablet 0  . metroNIDAZOLE (FLAGYL) 500 MG tablet Take 1 tablet (500 mg total) by mouth 2 (two) times daily. 14 tablet 0  . montelukast (SINGULAIR) 10 MG tablet Take 1 tablet (10 mg total) by mouth at bedtime. Patient needs office visit 90 tablet 1  . Nebulizers (CLEVER CHOICE NEBULIZER) MISC 1 each by Does not apply route as needed (Use nebulizer as needed for wheezing and cough). 1 each 0  . nitrofurantoin, macrocrystal-monohydrate, (MACROBID) 100 MG capsule Take 1 capsule (100 mg total)  by mouth 2 (two) times daily. 10 capsule 0  . nitroGLYCERIN (NITROSTAT) 0.4 MG SL tablet Place 1 tablet (0.4 mg total) under the tongue every 5 (five) minutes x 3 doses as needed for chest pain. (Patient not taking: Reported on 01/10/2019) 25 tablet 12  . omeprazole (PRILOSEC) 20 MG capsule Take 1 capsule (20 mg total) by mouth in the morning and at bedtime. 60 capsule 1  . ondansetron (ZOFRAN ODT) 4 MG disintegrating tablet Take 1 tablet (4 mg total) by mouth every 8 (eight) hours as needed for nausea or vomiting. 20 tablet 0  . predniSONE (STERAPRED UNI-PAK 21 TAB) 10 MG (21) TBPK tablet Take 6 tabs by mouth daily  for 1 days, then 5 tabs for 1days, then 4 tabs for 1  days, then 3 tabs for 1 days, 2 tabs for 1 days, then 1 tab by mouth daily for 1 days 21 tablet 0   No current facility-administered medications for this visit.    Allergies  Allergen Reactions  . Amoxicillin Nausea And Vomiting    Did it involve swelling of the face/tongue/throat, SOB, or low BP? No Did it involve sudden or severe rash/hives, skin peeling, or any reaction on the inside of your mouth or nose? No Did you need to seek medical attention at a hospital or doctor's office? No When did it last happen? If all above answers are "NO", may proceed with cephalosporin use.   . Augmentin [Amoxicillin-Pot Clavulanate] Nausea And Vomiting  . Benadryl [Diphenhydramine] Other (See Comments)    Patient stated that it makes her "go nuts"  . Losartan Cough  . Penicillins Nausea And Vomiting    Social History   Socioeconomic History  . Marital status: Divorced    Spouse name: Not on file  . Number of children: 1  . Years of education: Not on file  . Highest education level: Not on file  Occupational History  . Occupation: Works from home for [employer]  . Smoking status: Former Smoker    Packs/day: 1.50    Years: 30.00    Pack years: 45.00    Types: Cigarettes  . Smokeless tobacco: Never Used  . Tobacco comment: she uses vapor 6mg  no tobacco  Vaping Use  . Vaping Use: Every day  Substance and Sexual Activity  . Alcohol use: Yes    Alcohol/week: 0.0 standard drinks    Comment: occasionally  . Drug use: No  . Sexual activity: Not Currently    Birth control/protection: Implant    Comment: Nexplanon   Other Topics Concern  . Not on file  Social History Narrative   Caffeine intake: "Mountain dew nut" can drink an 18 pack in 2-3 days   Exercise: No   Social Determinants of Health   Financial Resource Strain:   . Difficulty of Paying Living Expenses: Not on file  Food Insecurity:   . Worried About in the Last Year: Not on file  . Ran  Out of Food in the Last Year: Not on file  Transportation Needs:   . Lack of Transportation (Medical): Not on file  . Lack of Transportation (Non-Medical): Not on file  Physical Activity:   . Days of Exercise per Week: Not on file  . Minutes of Exercise per Session: Not on file  Stress:   . Feeling of Stress : Not on file  Social Connections:   . Frequency of Communication with Friends and Family: Not on file  .  Frequency of Social Gatherings with Friends and Family: Not on file  . Attends Religious Services: Not on file  . Active Member of Clubs or Organizations: Not on file  . Attends Banker Meetings: Not on file  . Marital Status: Not on file  Intimate Partner Violence:   . Fear of Current or Ex-Partner: Not on file  . Emotionally Abused: Not on file  . Physically Abused: Not on file  . Sexually Abused: Not on file    Family History  Problem Relation Age of Onset  . Coronary artery disease Mother 37       MI  . Heart attack Mother   . Hypertension Mother   . Diabetes Mother   . Asthma Mother   . Coronary artery disease Father 2       MI  . Heart attack Father   . Hypertension Father   . Stroke Neg Hx     Review of Systems:  As stated in the HPI and otherwise negative.   There were no vitals taken for this visit.  Physical Examination: General: Well developed, well nourished, NAD  HEENT: OP clear, mucus membranes moist  SKIN: warm, dry. No rashes. Neuro: No focal deficits  Musculoskeletal: Muscle strength 5/5 all ext  Psychiatric: Mood and affect normal  Neck: No JVD, no carotid bruits, no thyromegaly, no lymphadenopathy.  Lungs:Clear bilaterally, no wheezes, rhonci, crackles Cardiovascular: Regular rate and rhythm. No murmurs, gallops or rubs. Abdomen:Soft. Bowel sounds present. Non-tender.  Extremities: No lower extremity edema. Pulses are 2 + in the bilateral DP/PT.  EKG:  EKG {ACTION; IS/IS [id number]} ordered today. The ekg ordered today  demonstrates ***  Echo June 2016:  - Left ventricle: There may be mild decreased thickening at the  base of the inferior wall. The cavity size was normal. Wall  thickness was normal. The estimated ejection fraction was 60%.  - Right ventricle: The cavity size was normal. Systolic function  was normal.   Recent Labs: No results found for requested labs within last 8760 hours.   Lipid Panel    Component Value Date/Time   CHOL 129 09/16/2014 1208   TRIG 108.0 09/16/2014 1208   HDL 30.80 (L) 09/16/2014 1208   CHOLHDL 4 09/16/2014 1208   VLDL 21.6 09/16/2014 1208   LDLCALC 77 09/16/2014 1208     Wt Readings from Last 3 Encounters:  06/18/19 235 lb 3.2 oz (106.7 kg)  12/15/18 220 lb 7.4 oz (100 kg)  11/01/18 220 lb 14.4 oz (100.2 kg)     Other studies Reviewed: Additional studies/ records that were reviewed today include: ***. Review of the above records demonstrates: ***   Assessment and Plan:   1. CAD without angina:   2. HTN:   3. Hyperlipidemia:   4. Tobacco abuse:   Current medicines are reviewed at length with the patient today.  The patient {ACTIONS; HAS/DOES NOT HAVE:19233} concerns regarding medicines.  The following changes have been made:  {PLAN; NO CHANGE:13088:s}  Labs/ tests ordered today include: *** No orders of the defined types were placed in this encounter.    Disposition:   FU with *** in {gen number 3-15:176160} {TIME; UNITS DAY/WEEK/MONTH:19136}   Signed, Verne Carrow, MD 08/31/2019 6:55 AM    [facility] [address] [place], [place], [place]  [address] Phone: [phone]; Fax: [phone]

## 2019-09-14 ENCOUNTER — Ambulatory Visit
Admission: EM | Admit: 2019-09-14 | Discharge: 2019-09-14 | Disposition: A | Discharge status: Home / Self Care | Payer: BC Managed Care – PPO | Attending: Emergency Medicine | Admitting: Emergency Medicine

## 2019-09-14 ENCOUNTER — Other Ambulatory Visit: Payer: Self-pay

## 2019-09-14 ENCOUNTER — Encounter: Payer: Self-pay | Admitting: Emergency Medicine

## 2019-09-14 DIAGNOSIS — Z1152 Encounter for screening for COVID-19: Secondary | ICD-10-CM | POA: Diagnosis not present

## 2019-09-14 DIAGNOSIS — J069 Acute upper respiratory infection, unspecified: Secondary | ICD-10-CM | POA: Diagnosis not present

## 2019-09-14 MED ORDER — CETIRIZINE HCL 10 MG PO TABS: 10.0000 mg | ORAL_TABLET | Freq: Every day | ORAL | 0 refills | Status: AC

## 2019-09-14 MED ORDER — DEXAMETHASONE 4 MG PO TABS: 4.0000 mg | ORAL_TABLET | Freq: Two times a day (BID) | ORAL | 0 refills | Status: AC

## 2019-09-14 MED ORDER — BENZONATATE 100 MG PO CAPS: 100.0000 mg | ORAL_CAPSULE | Freq: Three times a day (TID) | ORAL | 0 refills | Status: DC

## 2019-09-14 MED ORDER — FLUTICASONE PROPIONATE 50 MCG/ACT NA SUSP: 1.0000 | Freq: Every day | NASAL | 0 refills | Status: AC

## 2019-09-14 NOTE — ED Triage Notes (Signed)
Patient states that she has sinus congstion, had a positive exposure to boyfriend this week

## 2019-09-14 NOTE — Discharge Instructions (Addendum)

## 2019-09-14 NOTE — ED Provider Notes (Signed)
[facility]   [id number] 09/14/19 Arrival Time: 1717   CC: COVID symptoms  SUBJECTIVE: History from: patient.  Destanie Vanderpool is a 45 y.o. female who presents with a complaint of Covid exposure.  Reported boyfriend tested positive for Covid this week and patient is having nasal congestion and cough denies sick exposure to COVID, flu or strep.  Denies recent travel.  Has tried OTC medication without relief.  Denies aggravating or alleviating factors.  Denies previous symptoms in the past.   Denies fever, chills, fatigue, sinus pain, rhinorrhea, sore throat, SOB, wheezing, chest pain, nausea, changes in bowel or bladder habits.     ROS: As per HPI.  All other pertinent ROS negative.      Past Medical History:  Diagnosis Date  . Anxiety   . Asthma   . Chronic lower back pain   . Depression   . Diabetes (HCC) 2019  . Family history of adverse reaction to anesthesia    "mom didn't want to wake up at all"  . GERD (gastroesophageal reflux disease)   . Hiatal hernia   . Hypertension   . NSTEMI (non-ST elevated myocardial infarction) (HCC) 06/06/2014   stent to circumfkex  . Obesity    Past Surgical History:  Procedure Laterality Date  . CARDIAC CATHETERIZATION N/A 06/07/2014   Procedure: Left Heart Cath and Coronary Angiography;  Surgeon: Lennette Bihari, MD;  Location: [facility] INVASIVE CV LAB;  Service: Cardiovascular;  Laterality: N/A;  . CHOLECYSTECTOMY  2019  . FEMUR FRACTURE SURGERY Left 2003   "metal rod runs from hip to knee"  . FRACTURE SURGERY    . HOODED EYE SURGERY     Allergies  Allergen Reactions  . Amoxicillin Nausea And Vomiting    Did it involve swelling of the face/tongue/throat, SOB, or low BP? No Did it involve sudden or severe rash/hives, skin peeling, or any reaction on the inside of your mouth or nose? No Did you need to seek medical attention at a hospital or doctor's office? No When did it last happen? If all above answers are "NO", may proceed  with cephalosporin use.   . Augmentin [Amoxicillin-Pot Clavulanate] Nausea And Vomiting  . Benadryl [Diphenhydramine] Other (See Comments)    Patient stated that it makes her "go nuts"  . Losartan Cough  . Penicillins Nausea And Vomiting   No current facility-administered medications on file prior to encounter.   Current Outpatient Medications on File Prior to Encounter  Medication Sig Dispense Refill  . albuterol (VENTOLIN HFA) 108 (90 Base) MCG/ACT inhaler Inhale 1-2 puffs into the lungs every 6 (six) hours as needed for wheezing or shortness of breath. 18 g 0  . aspirin EC 81 MG EC tablet Take 1 tablet (81 mg total) by mouth daily.    Marland Kitchen atorvastatin (LIPITOR) 80 MG tablet Take 1 tablet (80 mg total) by mouth daily. Please keep upcoming appt in August with Dr. Clifton James before anymore refills. Thank you 30 tablet 0  . azithromycin (ZITHROMAX) 250 MG tablet Take 1 tablet (250 mg total) by mouth daily. Take first 2 tablets together, then 1 every day until finished. 6 tablet 0  . budesonide-formoterol (SYMBICORT) 160-4.5 MCG/ACT inhaler Inhale 2 puffs into the lungs 2 (two) times daily. 1 Inhaler 3  . ipratropium-albuterol (DUONEB) 0.5-2.5 (3) MG/3ML SOLN Take 3 mLs by nebulization every 4 (four) hours as needed. 360 mL 0  . meclizine (ANTIVERT) 50 MG tablet Take 1 tablet (50 mg total) by mouth 3 (three)  times daily as needed. 30 tablet 0  . metFORMIN (GLUCOPHAGE) 500 MG tablet Take 1 tablet (500 mg total) by mouth 2 (two) times daily with a meal. 180 tablet 0  . metroNIDAZOLE (FLAGYL) 500 MG tablet Take 1 tablet (500 mg total) by mouth 2 (two) times daily. 14 tablet 0  . montelukast (SINGULAIR) 10 MG tablet Take 1 tablet (10 mg total) by mouth at bedtime. Patient needs office visit 90 tablet 1  . Nebulizers (CLEVER CHOICE NEBULIZER) MISC 1 each by Does not apply route as needed (Use nebulizer as needed for wheezing and cough). 1 each 0  . nitrofurantoin, macrocrystal-monohydrate, (MACROBID) 100  MG capsule Take 1 capsule (100 mg total) by mouth 2 (two) times daily. 10 capsule 0  . nitroGLYCERIN (NITROSTAT) 0.4 MG SL tablet Place 1 tablet (0.4 mg total) under the tongue every 5 (five) minutes x 3 doses as needed for chest pain. (Patient not taking: Reported on 01/10/2019) 25 tablet 12  . omeprazole (PRILOSEC) 20 MG capsule Take 1 capsule (20 mg total) by mouth in the morning and at bedtime. 60 capsule 1  . ondansetron (ZOFRAN ODT) 4 MG disintegrating tablet Take 1 tablet (4 mg total) by mouth every 8 (eight) hours as needed for nausea or vomiting. 20 tablet 0  . predniSONE (STERAPRED UNI-PAK 21 TAB) 10 MG (21) TBPK tablet Take 6 tabs by mouth daily  for 1 days, then 5 tabs for 1days, then 4 tabs for 1 days, then 3 tabs for 1 days, 2 tabs for 1 days, then 1 tab by mouth daily for 1 days 21 tablet 0   Social History   Socioeconomic History  . Marital status: Divorced    Spouse name: Not on file  . Number of children: 1  . Years of education: Not on file  . Highest education level: Not on file  Occupational History  . Occupation: Works from home for [employer]  Tobacco Use  . Smoking status: Former Smoker    Packs/day: 1.50    Years: 30.00    Pack years: 45.00    Types: Cigarettes  . Smokeless tobacco: Never Used  . Tobacco comment: she uses vapor 6mg  no tobacco  Vaping Use  . Vaping Use: Every day  Substance and Sexual Activity  . Alcohol use: Yes    Alcohol/week: 0.0 standard drinks    Comment: occasionally  . Drug use: No  . Sexual activity: Not Currently    Birth control/protection: Implant    Comment: Nexplanon   Other Topics Concern  . Not on file  Social History Narrative   Caffeine intake: "Mountain dew nut" can drink an 18 pack in 2-3 days   Exercise: No   Social Determinants of Health   Financial Resource Strain:   . Difficulty of Paying Living Expenses: Not on file  Food Insecurity:   . Worried About Programme researcher, broadcasting/film/videounning Out of Food in the Last Year: Not on file  . Ran Out  of Food in the Last Year: Not on file  Transportation Needs:   . Lack of Transportation (Medical): Not on file  . Lack of Transportation (Non-Medical): Not on file  Physical Activity:   . Days of Exercise per Week: Not on file  . Minutes of Exercise per Session: Not on file  Stress:   . Feeling of Stress : Not on file  Social Connections:   . Frequency of Communication with Friends and Family: Not on file  . Frequency of Social Gatherings with Friends  and Family: Not on file  . Attends Religious Services: Not on file  . Active Member of Clubs or Organizations: Not on file  . Attends Banker Meetings: Not on file  . Marital Status: Not on file  Intimate Partner Violence:   . Fear of Current or Ex-Partner: Not on file  . Emotionally Abused: Not on file  . Physically Abused: Not on file  . Sexually Abused: Not on file   Family History  Problem Relation Age of Onset  . Coronary artery disease Mother 76       MI  . Heart attack Mother   . Hypertension Mother   . Diabetes Mother   . Asthma Mother   . Coronary artery disease Father 89       MI  . Heart attack Father   . Hypertension Father   . Stroke Neg Hx     OBJECTIVE:  Vitals:   09/14/19 1839  BP: 125/76  Pulse: 100  Resp: 15  Temp: 99 F (37.2 C)  TempSrc: Oral  SpO2: 98%     General appearance: alert; appears fatigued, but nontoxic; speaking in full sentences and tolerating own secretions HEENT: NCAT; Ears: EACs clear, TMs pearly gray; Eyes: PERRL.  EOM grossly intact. Sinuses: nontender; Nose: nares patent without rhinorrhea, Throat: oropharynx clear, tonsils non erythematous or enlarged, uvula midline  Neck: supple without LAD Lungs: unlabored respirations, symmetrical air entry; cough: mild; no respiratory distress; CTAB Heart: regular rate and rhythm.  Radial pulses 2+ symmetrical bilaterally Skin: warm and dry Psychological: alert and cooperative; normal mood and affect  LABS:  No results  found for this or any previous visit (from the past 24 hour(s)).   ASSESSMENT & PLAN:  1. URI with cough and congestion   2. Encounter for screening for COVID-19     Meds ordered this encounter  Medications  . fluticasone (FLONASE) 50 MCG/ACT nasal spray    Sig: Place 1 spray into both nostrils daily for 14 days.    Dispense:  16 g    Refill:  0  . cetirizine (ZYRTEC ALLERGY) 10 MG tablet    Sig: Take 1 tablet (10 mg total) by mouth daily.    Dispense:  30 tablet    Refill:  0  . benzonatate (TESSALON) 100 MG capsule    Sig: Take 1 capsule (100 mg total) by mouth every 8 (eight) hours.    Dispense:  30 capsule    Refill:  0  . dexamethasone (DECADRON) 4 MG tablet    Sig: Take 1 tablet (4 mg total) by mouth 2 (two) times daily with a meal for 7 days.    Dispense:  7 tablet    Refill:  0    Discharge Instructions  COVID testing ordered.  It will take between 2-7 days for test results.  Someone will contact you regarding abnormal results.    In the meantime: You should remain isolated in your home for 10 days from symptom onset AND greater than 24 hours after symptoms resolution (absence of fever without the use of fever-reducing medication and improvement in respiratory symptoms), whichever is longer Get plenty of rest and push fluids Tessalon Perles prescribed for cough Zyrtec for nasal congestion, runny nose, and/or sore throat Flonase for nasal congestion and runny nose Decadron was prescribed Use medications daily for symptom relief Use OTC medications like ibuprofen or tylenol as needed fever or pain Call or go to the ED if you have any new  or worsening symptoms such as fever, worsening cough, shortness of breath, chest tightness, chest pain, turning blue, changes in mental status, etc...   Reviewed expectations re: course of current medical issues. Questions answered. Outlined signs and symptoms indicating need for more acute intervention. Patient verbalized  understanding. After Visit Summary given.      Note: This document was prepared using Dragon voice recognition software and may include unintentional dictation errors.    Durward Parcel, FNP 09/14/19 1912

## 2019-09-16 ENCOUNTER — Telehealth: Payer: BC Managed Care – PPO | Admitting: Emergency Medicine

## 2019-09-16 DIAGNOSIS — R197 Diarrhea, unspecified: Secondary | ICD-10-CM | POA: Diagnosis not present

## 2019-09-16 NOTE — Progress Notes (Signed)
Time spent: 10 min (delayed response due to ongoing MDLive visits)  We are sorry that you are not feeling well.  Here is how we plan to help!  Based on what you have shared with me it looks like you have Acute Infectious Diarrhea.  Most cases of acute diarrhea are due to infections with virus and bacteria and are self-limited conditions lasting less than 14 days.  Based on your chart it looks like you have a pending COVID test after an exposure to someone with COVID.  COVID can cause diarrhea and other GI symptoms (nausea, vomiting, abdominal pain) so it could be related.   For your symptoms you may take Imodium 2 mg tablets that are over the counter at your local pharmacy. Take two tablet now and then one after each loose stool up to 6 a day.  Antibiotics are not needed for most people with diarrhea.  HOME CARE  We recommend changing your diet to help with your symptoms for the next few days.  Drink plenty of fluids that contain water salt and sugar. Sports drinks such as Gatorade may help.   You may try broths, soups, bananas, applesauce, soft breads, mashed potatoes or crackers.   You are considered infectious for as long as the diarrhea continues. Hand washing or use of alcohol based hand sanitizers is recommend.  It is best to stay out of work or school until your symptoms stop.   GET HELP RIGHT AWAY  If you have dark yellow colored urine or do not pass urine frequently you should drink more fluids.    If your symptoms worsen   If you feel like you are going to pass out (faint)  You have a new problem  MAKE SURE YOU   Understand these instructions.  Will watch your condition.  Will get help right away if you are not doing well or get worse.  Your e-visit answers were reviewed by a board certified advanced clinical practitioner to complete your personal care plan.  Depending on the condition, your plan could have included both over the counter or prescription  medications.  If there is a problem please reply  once you have received a response from your provider.  Your safety is important to [place].  If you have drug allergies check your prescription carefully.    You can use MyChart to ask questions about today's visit, request a non-urgent call back, or ask for a work or school excuse for 24 hours related to this e-Visit. If it has been greater than 24 hours you will need to follow up with your provider, or enter a new e-Visit to address those concerns.   You will get an e-mail in the next two days asking about your experience.  I hope that your e-visit has been valuable and will speed your recovery. Thank you for using e-visits.  Sharen Heck, PA-C [facility]

## 2019-09-17 ENCOUNTER — Encounter (INDEPENDENT_AMBULATORY_CARE_PROVIDER_SITE_OTHER): Payer: Self-pay | Admitting: Primary Care

## 2019-09-17 DIAGNOSIS — J4520 Mild intermittent asthma, uncomplicated: Secondary | ICD-10-CM

## 2019-09-18 LAB — NOVEL CORONAVIRUS, NAA: SARS-CoV-2, NAA: DETECTED — AB

## 2019-09-21 ENCOUNTER — Other Ambulatory Visit: Payer: Self-pay

## 2019-09-21 ENCOUNTER — Emergency Department (HOSPITAL_COMMUNITY)
Admission: EM | Admit: 2019-09-21 | Discharge: 2019-09-21 | Disposition: A | Discharge status: Home / Self Care | Payer: BC Managed Care – PPO | Attending: Emergency Medicine | Admitting: Emergency Medicine

## 2019-09-21 ENCOUNTER — Encounter (HOSPITAL_COMMUNITY): Payer: Self-pay | Admitting: *Deleted

## 2019-09-21 DIAGNOSIS — R05 Cough: Secondary | ICD-10-CM | POA: Diagnosis not present

## 2019-09-21 DIAGNOSIS — Z7982 Long term (current) use of aspirin: Secondary | ICD-10-CM | POA: Diagnosis not present

## 2019-09-21 DIAGNOSIS — R0981 Nasal congestion: Secondary | ICD-10-CM | POA: Insufficient documentation

## 2019-09-21 DIAGNOSIS — Z7951 Long term (current) use of inhaled steroids: Secondary | ICD-10-CM | POA: Diagnosis not present

## 2019-09-21 DIAGNOSIS — Z7984 Long term (current) use of oral hypoglycemic drugs: Secondary | ICD-10-CM | POA: Insufficient documentation

## 2019-09-21 DIAGNOSIS — E1165 Type 2 diabetes mellitus with hyperglycemia: Secondary | ICD-10-CM | POA: Diagnosis not present

## 2019-09-21 DIAGNOSIS — R739 Hyperglycemia, unspecified: Secondary | ICD-10-CM | POA: Diagnosis not present

## 2019-09-21 DIAGNOSIS — Z8616 Personal history of COVID-19: Secondary | ICD-10-CM | POA: Diagnosis not present

## 2019-09-21 DIAGNOSIS — J45909 Unspecified asthma, uncomplicated: Secondary | ICD-10-CM | POA: Insufficient documentation

## 2019-09-21 DIAGNOSIS — Z87891 Personal history of nicotine dependence: Secondary | ICD-10-CM | POA: Insufficient documentation

## 2019-09-21 DIAGNOSIS — I10 Essential (primary) hypertension: Secondary | ICD-10-CM | POA: Insufficient documentation

## 2019-09-21 LAB — BASIC METABOLIC PANEL
Anion gap: 15 (ref 5–15)
BUN: 19 mg/dL (ref 6–20)
CO2: 21 mmol/L — ABNORMAL LOW (ref 22–32)
Calcium: 8.7 mg/dL — ABNORMAL LOW (ref 8.9–10.3)
Chloride: 96 mmol/L — ABNORMAL LOW (ref 98–111)
Creatinine, Ser: 0.95 mg/dL (ref 0.44–1.00)
GFR calc Af Amer: >60 mL/min (ref >60)
GFR calc non Af Amer: >60 mL/min (ref >60)
Glucose, Bld: 466 mg/dL — ABNORMAL HIGH (ref 70–99)
Potassium: 4.2 mmol/L (ref 3.5–5.1)
Sodium: 132 mmol/L — ABNORMAL LOW (ref 135–145)

## 2019-09-21 LAB — CBC
HCT: 41.1 % (ref 36.0–46.0)
Hemoglobin: 13.7 g/dL (ref 12.0–15.0)
MCH: 30.9 pg (ref 26.0–34.0)
MCHC: 33.3 g/dL (ref 30.0–36.0)
MCV: 92.8 fL (ref 80.0–100.0)
Platelets: 311 10*3/uL (ref 150–400)
RBC: 4.43 MIL/uL (ref 3.87–5.11)
RDW: 12.5 % (ref 11.5–15.5)
WBC: 20.4 10*3/uL — ABNORMAL HIGH (ref 4.0–10.5)
nRBC: 0 % (ref 0.0–0.2)

## 2019-09-21 LAB — CBG MONITORING, ED
Glucose-Capillary: 425 mg/dL — ABNORMAL HIGH (ref 70–99)
Glucose-Capillary: 363 mg/dL — ABNORMAL HIGH (ref 70–99)
Glucose-Capillary: 371 mg/dL — ABNORMAL HIGH (ref 70–99)

## 2019-09-21 MED ORDER — ALBUTEROL SULFATE HFA 108 (90 BASE) MCG/ACT IN AERS
1.0000 | INHALATION_SPRAY | Freq: Four times a day (QID) | RESPIRATORY_TRACT | 0 refills | Status: DC | PRN
Start: 2019-09-21 — End: 2020-06-30

## 2019-09-21 MED ORDER — BUDESONIDE-FORMOTEROL FUMARATE 160-4.5 MCG/ACT IN AERO: 2.0000 | INHALATION_SPRAY | Freq: Two times a day (BID) | RESPIRATORY_TRACT | 3 refills | Status: AC

## 2019-09-21 MED ORDER — LACTATED RINGERS IV BOLUS
1000.0000 mL | Freq: Once | INTRAVENOUS | Status: AC
  Administered 2019-09-21: 1000 mL via INTRAVENOUS

## 2019-09-21 MED ORDER — INSULIN ASPART 100 UNIT/ML ~~LOC~~ SOLN
10.0000 [IU] | Freq: Once | SUBCUTANEOUS | Status: AC
  Administered 2019-09-21: 10 [IU] via SUBCUTANEOUS
  Filled 2019-09-21: qty 1

## 2019-09-21 MED ORDER — IPRATROPIUM-ALBUTEROL 0.5-2.5 (3) MG/3ML IN SOLN: 3.0000 mL | RESPIRATORY_TRACT | 0 refills | Status: AC | PRN

## 2019-09-21 NOTE — ED Notes (Signed)
Dr Criss Alvine notified of pt's Blood glucose of 371. No new orders given.

## 2019-09-21 NOTE — ED Provider Notes (Signed)
[facility] EMERGENCY DEPARTMENT Provider Note   CSN: [id number] Arrival date & time: 09/21/19  1538     History Chief Complaint  Patient presents with  . Hyperglycemia    Kristen Price is a 44 y.o. female.  HPI 44 year old female presents with hyperglycemia.  She states that she was diagnosed with Covid a couple weeks ago.  At first she thought it was allergies then diagnosed with Covid.  Was put on steroids but then because she messed up the dosages she states she had to go on another round of steroids.  She is now on dexamethasone and is due to finish tonight.  However she is been feeling a little worse and worse and it came to ahead today.  She feels diffuse weakness, blurry vision, increased thirst and increased urination.  Did not have any test trips were finally got some and her glucose was high, 300, 400, and then 500.  She is on metformin 500 twice daily.   Past Medical History:  Diagnosis Date  . Anxiety   . Asthma   . Chronic lower back pain   . Depression   . Diabetes (HCC) 2019  . Family history of adverse reaction to anesthesia    "mom didn't want to wake up at all"  . GERD (gastroesophageal reflux disease)   . Hiatal hernia   . Hypertension   . NSTEMI (non-ST elevated myocardial infarction) (HCC) 06/06/2014   stent to circumfkex  . Obesity     Patient Active Problem List   Diagnosis Date Noted  . Diabetes mellitus (HCC) 02/22/2018  . Obesity (BMI 35.0-39.9 without comorbidity) 02/22/2018  . Mild intermittent asthma without complication 03/06/2017  . Conductive hearing loss of left ear 08/19/2015  . Gallbladder disease 03/03/2015  . Coronary artery disease involving native coronary artery of native heart without angina pectoris 06/24/2014  . Elevated troponin   . History of non-ST elevation myocardial infarction (NSTEMI) 06/06/2014  . Tobacco abuse 06/06/2014  . Chronic low back pain 10/25/2011  . GAD (generalized anxiety disorder) 09/27/2011  . Insomnia  09/27/2011    Past Surgical History:  Procedure Laterality Date  . CARDIAC CATHETERIZATION N/A 06/07/2014   Procedure: Left Heart Cath and Coronary Angiography;  Surgeon: Lennette Bihari, MD;  Location: [facility] INVASIVE CV LAB;  Service: Cardiovascular;  Laterality: N/A;  . CHOLECYSTECTOMY  2019  . FEMUR FRACTURE SURGERY Left 2003   "metal rod runs from hip to knee"  . FRACTURE SURGERY    . HOODED EYE SURGERY       OB History    Gravida  1   Para  1   Term  1   Preterm      AB      Living        SAB      TAB      Ectopic      Multiple      Live Births              Family History  Problem Relation Age of Onset  . Coronary artery disease Mother 69       MI  . Heart attack Mother   . Hypertension Mother   . Diabetes Mother   . Asthma Mother   . Coronary artery disease Father 62       MI  . Heart attack Father   . Hypertension Father   . Stroke Neg Hx     Social History   Tobacco Use  .  Smoking status: Former Smoker    Packs/day: 1.50    Years: 30.00    Pack years: 45.00    Types: Cigarettes  . Smokeless tobacco: Never Used  . Tobacco comment: she uses vapor 6mg  no tobacco  Vaping Use  . Vaping Use: Every day  Substance Use Topics  . Alcohol use: Yes    Alcohol/week: 0.0 standard drinks    Comment: occasionally  . Drug use: No    Home Medications Prior to Admission medications   Medication Sig Start Date End Date Taking? Authorizing Provider  albuterol (VENTOLIN HFA) 108 (90 Base) MCG/ACT inhaler Inhale 1-2 puffs into the lungs every 6 (six) hours as needed for wheezing or shortness of breath. 09/21/19   09/23/19, NP  aspirin EC 81 MG EC tablet Take 1 tablet (81 mg total) by mouth daily. 06/08/14   08/08/14, PA-C  atorvastatin (LIPITOR) 80 MG tablet Take 1 tablet (80 mg total) by mouth daily. Please keep upcoming appt in August with Dr. September before anymore refills. Thank you 08/20/19   08/22/19, MD  azithromycin  (ZITHROMAX) 250 MG tablet Take 1 tablet (250 mg total) by mouth daily. Take first 2 tablets together, then 1 every day until finished. 08/28/19   Avegno, 08/30/19, FNP  benzonatate (TESSALON) 100 MG capsule Take 1 capsule (100 mg total) by mouth every 8 (eight) hours. 09/14/19   Avegno, 11/14/19, FNP  budesonide-formoterol (SYMBICORT) 160-4.5 MCG/ACT inhaler Inhale 2 puffs into the lungs 2 (two) times daily. 09/21/19   09/23/19, NP  cetirizine (ZYRTEC ALLERGY) 10 MG tablet Take 1 tablet (10 mg total) by mouth daily. 09/14/19   Avegno, 11/14/19, FNP  dexamethasone (DECADRON) 4 MG tablet Take 1 tablet (4 mg total) by mouth 2 (two) times daily with a meal for 7 days. 09/14/19 09/21/19  Avegno, 09/23/19, FNP  fluticasone (FLONASE) 50 MCG/ACT nasal spray Place 1 spray into both nostrils daily for 14 days. 09/14/19 09/28/19  Avegno, 09/30/19, FNP  ipratropium-albuterol (DUONEB) 0.5-2.5 (3) MG/3ML SOLN Take 3 mLs by nebulization every 4 (four) hours as needed. 09/21/19   09/23/19, NP  meclizine (ANTIVERT) 50 MG tablet Take 1 tablet (50 mg total) by mouth 3 (three) times daily as needed. 06/18/19   Avegno, 06/20/19, FNP  metFORMIN (GLUCOPHAGE) 500 MG tablet Take 1 tablet (500 mg total) by mouth 2 (two) times daily with a meal. 06/18/19   Avegno, 06/20/19, FNP  metroNIDAZOLE (FLAGYL) 500 MG tablet Take 1 tablet (500 mg total) by mouth 2 (two) times daily. 08/16/19   Wurst, 10/16/19, PA-C  montelukast (SINGULAIR) 10 MG tablet Take 1 tablet (10 mg total) by mouth at bedtime. Patient needs office visit 06/18/19   06/20/19, FNP  Nebulizers (CLEVER CHOICE NEBULIZER) MISC 1 each by Does not apply route as needed (Use nebulizer as needed for wheezing and cough). 04/13/19   Avegno, 06/13/19, FNP  nitrofurantoin, macrocrystal-monohydrate, (MACROBID) 100 MG capsule Take 1 capsule (100 mg total) by mouth 2 (two) times daily. 08/15/19   Wurst, 10/15/19, PA-C  nitroGLYCERIN (NITROSTAT) 0.4 MG  SL tablet Place 1 tablet (0.4 mg total) under the tongue every 5 (five) minutes x 3 doses as needed for chest pain. Patient not taking: Reported on 01/10/2019 02/22/18   02/24/18, PA-C  omeprazole (PRILOSEC) 20 MG capsule Take 1 capsule (20 mg total) by mouth in the morning and at bedtime. 08/15/19  Wurst, [place], PA-C  ondansetron (ZOFRAN ODT) 4 MG disintegrating tablet Take 1 tablet (4 mg total) by mouth every 8 (eight) hours as needed for nausea or vomiting. 03/20/19   Wieters, Hallie C, PA-C  predniSONE (STERAPRED UNI-PAK 21 TAB) 10 MG (21) TBPK tablet Take 6 tabs by mouth daily  for 1 days, then 5 tabs for 1days, then 4 tabs for 1 days, then 3 tabs for 1 days, 2 tabs for 1 days, then 1 tab by mouth daily for 1 days 08/28/19   Durward Parcel, FNP    Allergies    Amoxicillin, Augmentin [amoxicillin-pot clavulanate], Benadryl [diphenhydramine], Losartan, and Penicillins  Review of Systems   Review of Systems  HENT: Positive for congestion.   Respiratory: Positive for cough. Negative for shortness of breath.   Endocrine: Positive for polydipsia and polyuria.  Genitourinary: Negative for dysuria.  Neurological: Positive for weakness.  All other systems reviewed and are negative.   Physical Exam Updated Vital Signs BP 120/72 (BP Location: Right Arm)   Pulse 77   Temp 99.2 F (37.3 C) (Oral)   Resp 18   SpO2 96%   Physical Exam Vitals and nursing note reviewed.  Constitutional:      Appearance: She is well-developed.  HENT:     Head: Normocephalic and atraumatic.     Right Ear: External ear normal.     Left Ear: External ear normal.     Nose: Nose normal.  Eyes:     General:        Right eye: No discharge.        Left eye: No discharge.  Cardiovascular:     Rate and Rhythm: Normal rate and regular rhythm.     Heart sounds: Normal heart sounds.  Pulmonary:     Effort: Pulmonary effort is normal.     Breath sounds: Normal breath sounds. No wheezing.  Abdominal:      Palpations: Abdomen is soft.     Tenderness: There is no abdominal tenderness.  Skin:    General: Skin is warm and dry.  Neurological:     Mental Status: She is alert.  Psychiatric:        Mood and Affect: Mood is not anxious.     ED Results / Procedures / Treatments   Labs (all labs ordered are listed, but only abnormal results are displayed) Labs Reviewed  BASIC METABOLIC PANEL - Abnormal; Notable for the following components:      Result Value   Sodium 132 (*)    Chloride 96 (*)    CO2 21 (*)    Glucose, Bld 466 (*)    Calcium 8.7 (*)    All other components within normal limits  CBC - Abnormal; Notable for the following components:   WBC 20.4 (*)    All other components within normal limits  CBG MONITORING, ED - Abnormal; Notable for the following components:   Glucose-Capillary 425 (*)    All other components within normal limits  CBG MONITORING, ED - Abnormal; Notable for the following components:   Glucose-Capillary 363 (*)    All other components within normal limits  CBG MONITORING, ED - Abnormal; Notable for the following components:   Glucose-Capillary 371 (*)    All other components within normal limits  URINALYSIS, ROUTINE W REFLEX MICROSCOPIC  POC URINE PREG, ED    EKG None  Radiology No results found.  Procedures Procedures (including critical care time)  Medications Ordered in ED Medications  lactated  ringers bolus 1,000 mL ( Intravenous Stopped 09/21/19 2149)  lactated ringers bolus 1,000 mL ( Intravenous Stopped 09/21/19 2035)  insulin aspart (novoLOG) injection 10 Units (10 Units Subcutaneous Given 09/21/19 2048)    ED Course  I have reviewed the triage vital signs and the nursing notes.  Pertinent labs & imaging results that were available during my care of the patient were reviewed by me and considered in my medical decision making (see chart for details).    MDM Rules/Calculators/A&P                          Patient presents with  hyperglycemia.  This is likely due to acute infection as well as her being on steroids multiple times.  I think this likely also explains her significant leukocytosis.  She does not have signs/symptoms of bacterial infection.  I offered chest x-ray given her persistent cough but she declines.  Her lungs are clear.  She was given fluids and some subcutaneous insulin.  However I do not think aggressive outpatient insulin regimen would be warranted as she is likely to come back down now that she is done with steroids.  I did recommend she could increase her Metformin temporarily at home and then follow-up with her PCP.  Labs not consistent with DKA.  Patient wanted to leave prior to giving a urine sample but my suspicion of UTI/DKA is low.  Kristen Price was evaluated in Emergency Department on 09/21/2019 for the symptoms described in the history of present illness. She was evaluated in the context of the global COVID-19 pandemic, which necessitated consideration that the patient might be at risk for infection with the SARS-CoV-2 virus that causes COVID-19. Institutional protocols and algorithms that pertain to the evaluation of patients at risk for COVID-19 are in a state of rapid change based on information released by regulatory bodies including the CDC and federal and state organizations. These policies and algorithms were followed during the patient's care in the ED.  Final Clinical Impression(s) / ED Diagnoses Final diagnoses:  Hyperglycemia    Rx / DC Orders ED Discharge Orders    None       Pricilla LovelessGoldston, Telma Pyeatt, MD 09/21/19 2212

## 2019-09-21 NOTE — ED Triage Notes (Signed)
Pt states she has had covid and was placed on a steroid and since she has been taking the steroid her sugars have been running in the 500's

## 2019-09-21 NOTE — ED Notes (Addendum)
Pt reports she has one dose of steroid left due to covid. Pt dx with covid 2 weeks on 9/10 Pt reports feeling tired and still has a cough

## 2019-09-25 ENCOUNTER — Telehealth: Payer: BC Managed Care – PPO | Admitting: Emergency Medicine

## 2019-09-25 ENCOUNTER — Ambulatory Visit: Payer: Self-pay

## 2019-09-25 DIAGNOSIS — U071 COVID-19: Secondary | ICD-10-CM

## 2019-09-25 NOTE — Progress Notes (Signed)
Based on what you shared with me, I feel your condition warrants further evaluation and I recommend that you be seen for a face to face visit.  Please contact your primary care physician practice to be seen. Many offices offer virtual options to be seen via video if you are not comfortable going in person to a medical facility at this time.  If you do not have a PCP, [facility] offers a free physician referral service available at [phone]. Our trained staff has the experience, knowledge and resources to put you in touch with a physician who is right for you.   You also have the option of a video visit through [email or web address]  If you are having a true medical emergency please call 911.  NOTE: If you entered your credit card information for this eVisit, you will not be charged. You may see a "hold" on your card for the $35 but that hold will drop off and you will not have a charge processed.  Your e-visit answers were reviewed by a board certified advanced clinical practitioner to complete your personal care plan.  Thank you for using e-Visits.  

## 2019-09-25 NOTE — Telephone Encounter (Signed)
°  Pt. Reports she was diagnosed with COVID 19 09/14/19 and is still sick. Has cough, shortness of breath, fatigue and weakness. Had an e-visit today and was told to have a face to face visit. Pt. Going to [facility]. Reason for Disposition  Patient sounds very sick or weak to the triager  Answer Assessment - Initial Assessment Questions 1. COVID-19 DIAGNOSIS: "Who made your Coronavirus (COVID-19) diagnosis?" "Was it confirmed by a positive lab test?" If not diagnosed by a HCP, ask "Are there lots of cases (community spread) where you live?" (See public health department website, if unsure)     [facility]  2. COVID-19 EXPOSURE: "Was there any known exposure to COVID before the symptoms began?" CDC Definition of close contact: within 6 feet (2 meters) for a total of 15 minutes or more over a 24-hour period.      No 3. ONSET: "When did the COVID-19 symptoms start?"      09/14/19 4. WORST SYMPTOM: "What is your worst symptom?" (e.g., cough, fever, shortness of breath, muscle aches)     Shortness of breath 5. COUGH: "Do you have a cough?" If Yes, ask: "How bad is the cough?"       Yes 6. FEVER: "Do you have a fever?" If Yes, ask: "What is your temperature, how was it measured, and when did it start?"     No 7. RESPIRATORY STATUS: "Describe your breathing?" (e.g., shortness of breath, wheezing, unable to speak)      Shortness of breath 8. BETTER-SAME-WORSE: "Are you getting better, staying the same or getting worse compared to yesterday?"  If getting worse, ask, "In what way?"     Better 9. HIGH RISK DISEASE: "Do you have any chronic medical problems?" (e.g., asthma, heart or lung disease, weak immune system, obesity, etc.)     Asthma 10. PREGNANCY: "Is there any chance you are pregnant?" "When was your last menstrual period?"       No 11. OTHER SYMPTOMS: "Do you have any other symptoms?"  (e.g., chills, fatigue, headache, loss of smell or taste, muscle pain, sore throat; new loss of smell or taste especially  support the diagnosis of COVID-19)       Fatigue, shortness of breath  Protocols used: CORONAVIRUS (COVID-19) DIAGNOSED OR SUSPECTED-A-AH

## 2019-09-26 ENCOUNTER — Ambulatory Visit
Admission: EM | Admit: 2019-09-26 | Discharge: 2019-09-26 | Disposition: A | Discharge status: Home / Self Care | Payer: BC Managed Care – PPO | Attending: Emergency Medicine | Admitting: Emergency Medicine

## 2019-09-26 ENCOUNTER — Encounter: Payer: Self-pay | Admitting: Emergency Medicine

## 2019-09-26 DIAGNOSIS — R059 Cough, unspecified: Secondary | ICD-10-CM

## 2019-09-26 DIAGNOSIS — U071 COVID-19: Secondary | ICD-10-CM | POA: Diagnosis not present

## 2019-09-26 DIAGNOSIS — R52 Pain, unspecified: Secondary | ICD-10-CM

## 2019-09-26 DIAGNOSIS — R0602 Shortness of breath: Secondary | ICD-10-CM

## 2019-09-26 DIAGNOSIS — J019 Acute sinusitis, unspecified: Secondary | ICD-10-CM

## 2019-09-26 MED ORDER — DOXYCYCLINE HYCLATE 100 MG PO CAPS: 100.0000 mg | ORAL_CAPSULE | Freq: Two times a day (BID) | ORAL | 0 refills | Status: DC

## 2019-09-26 MED ORDER — MELOXICAM 7.5 MG PO TABS: 7.5000 mg | ORAL_TABLET | Freq: Every day | ORAL | 0 refills | Status: AC

## 2019-09-26 NOTE — ED Triage Notes (Signed)
Pt is covid positive.  Having severe body aches and coughing constantly.

## 2019-09-26 NOTE — Discharge Instructions (Addendum)
Unable to rule out blood clot in urgent care setting.  Offered patient further evaluation and management in the ED.  Patient declines at this time and would like to try outpatient therapy first.  Aware of the risk associated with this decision including missed diagnosis, organ damage, organ failure, and/or death.  Patient aware and in agreement.     Get plenty of rest and push fluids Doxycycline for sinus infection Mobic for body aches and pains Continue with at home breathing treatments and albuterol inhaler for shortness of breath.  We will hold off on oral steroids given elevated blood sugar Use OTC medications like ibuprofen or tylenol as needed fever or pain Follow up with COVID respiratory clinic for further evaluation and management of chronic covid symptoms Go to the ED if you have any new or worsening symptoms such as fever, worsening cough, shortness of breath, chest tightness, chest pain, turning blue, changes in mental status, etc..Marland Kitchen

## 2019-09-26 NOTE — ED Provider Notes (Signed)
[facility]ARE CENTER   [id number] 09/26/19 Arrival Time: 1253   CC: COVID infection  SUBJECTIVE: History from: patient.  Shaneice Porrata is a 44 y.o. female who presents with severe body aches, sinus pain/ pressure, and nonproductive cough x 5 days.  Tested positive for COVID 3 weeks ago.  Symptoms were improving, then went to [facility] ED 5 days ago.  Treated for hyperglycemia secondary to steroid use per patient.  Has tried medications without relief.  Symptoms are made worse with working.  Requesting work not.  Denies previous symptoms in the past.   Denies fever, rhinorrhea, sore throat, SOB, wheezing, chest pain, nausea, changes in bowel or bladder habits.    ROS: As per HPI.  All other pertinent ROS negative.     Past Medical History:  Diagnosis Date  . Anxiety   . Asthma   . Chronic lower back pain   . Depression   . Diabetes (HCC) 2019  . Family history of adverse reaction to anesthesia    "mom didn't want to wake up at all"  . GERD (gastroesophageal reflux disease)   . Hiatal hernia   . Hypertension   . NSTEMI (non-ST elevated myocardial infarction) (HCC) 06/06/2014   stent to circumfkex  . Obesity    Past Surgical History:  Procedure Laterality Date  . CARDIAC CATHETERIZATION N/A 06/07/2014   Procedure: Left Heart Cath and Coronary Angiography;  Surgeon: Lennette Bihari, MD;  Location: [facility] INVASIVE CV LAB;  Service: Cardiovascular;  Laterality: N/A;  . CHOLECYSTECTOMY  2019  . FEMUR FRACTURE SURGERY Left 2003   "metal rod runs from hip to knee"  . FRACTURE SURGERY    . HOODED EYE SURGERY     Allergies  Allergen Reactions  . Amoxicillin Nausea And Vomiting    Did it involve swelling of the face/tongue/throat, SOB, or low BP? No Did it involve sudden or severe rash/hives, skin peeling, or any reaction on the inside of your mouth or nose? No Did you need to seek medical attention at a hospital or doctor's office? No When did it last happen? If all above  answers are "NO", may proceed with cephalosporin use.   . Augmentin [Amoxicillin-Pot Clavulanate] Nausea And Vomiting  . Benadryl [Diphenhydramine] Other (See Comments)    Patient stated that it makes her "go nuts"  . Losartan Cough  . Penicillins Nausea And Vomiting   No current facility-administered medications on file prior to encounter.   Current Outpatient Medications on File Prior to Encounter  Medication Sig Dispense Refill  . albuterol (VENTOLIN HFA) 108 (90 Base) MCG/ACT inhaler Inhale 1-2 puffs into the lungs every 6 (six) hours as needed for wheezing or shortness of breath. 18 g 0  . aspirin EC 81 MG EC tablet Take 1 tablet (81 mg total) by mouth daily.    Marland Kitchen atorvastatin (LIPITOR) 80 MG tablet Take 1 tablet (80 mg total) by mouth daily. Please keep upcoming appt in August with Dr. Clifton James before anymore refills. Thank you 30 tablet 0  . budesonide-formoterol (SYMBICORT) 160-4.5 MCG/ACT inhaler Inhale 2 puffs into the lungs 2 (two) times daily. 10.2 g 3  . cetirizine (ZYRTEC ALLERGY) 10 MG tablet Take 1 tablet (10 mg total) by mouth daily. 30 tablet 0  . fluticasone (FLONASE) 50 MCG/ACT nasal spray Place 1 spray into both nostrils daily for 14 days. 16 g 0  . ipratropium-albuterol (DUONEB) 0.5-2.5 (3) MG/3ML SOLN Take 3 mLs by nebulization every 4 (four) hours as needed. 360 mL  0  . meclizine (ANTIVERT) 50 MG tablet Take 1 tablet (50 mg total) by mouth 3 (three) times daily as needed. 30 tablet 0  . metFORMIN (GLUCOPHAGE) 500 MG tablet Take 1 tablet (500 mg total) by mouth 2 (two) times daily with a meal. 180 tablet 0  . montelukast (SINGULAIR) 10 MG tablet Take 1 tablet (10 mg total) by mouth at bedtime. Patient needs office visit 90 tablet 1  . Nebulizers (CLEVER CHOICE NEBULIZER) MISC 1 each by Does not apply route as needed (Use nebulizer as needed for wheezing and cough). 1 each 0  . nitrofurantoin, macrocrystal-monohydrate, (MACROBID) 100 MG capsule Take 1 capsule (100 mg  total) by mouth 2 (two) times daily. 10 capsule 0  . nitroGLYCERIN (NITROSTAT) 0.4 MG SL tablet Place 1 tablet (0.4 mg total) under the tongue every 5 (five) minutes x 3 doses as needed for chest pain. (Patient not taking: Reported on 01/10/2019) 25 tablet 12  . omeprazole (PRILOSEC) 20 MG capsule Take 1 capsule (20 mg total) by mouth in the morning and at bedtime. 60 capsule 1   Social History   Socioeconomic History  . Marital status: Divorced    Spouse name: Not on file  . Number of children: 1  . Years of education: Not on file  . Highest education level: Not on file  Occupational History  . Occupation: Works from home for [employer]  . Smoking status: Former Smoker    Packs/day: 1.50    Years: 30.00    Pack years: 45.00    Types: Cigarettes  . Smokeless tobacco: Never Used  . Tobacco comment: she uses vapor 6mg  no tobacco  Vaping Use  . Vaping Use: Every day  Substance and Sexual Activity  . Alcohol use: Yes    Alcohol/week: 0.0 standard drinks    Comment: occasionally  . Drug use: No  . Sexual activity: Not Currently    Birth control/protection: Implant    Comment: Nexplanon   Other Topics Concern  . Not on file  Social History Narrative   Caffeine intake: "Mountain dew nut" can drink an 18 pack in 2-3 days   Exercise: No   Social Determinants of Health   Financial Resource Strain:   . Difficulty of Paying Living Expenses: Not on file  Food Insecurity:   . Worried About in the Last Year: Not on file  . Ran Out of Food in the Last Year: Not on file  Transportation Needs:   . Lack of Transportation (Medical): Not on file  . Lack of Transportation (Non-Medical): Not on file  Physical Activity:   . Days of Exercise per Week: Not on file  . Minutes of Exercise per Session: Not on file  Stress:   . Feeling of Stress : Not on file  Social Connections:   . Frequency of Communication with Friends and Family: Not on file  . Frequency of  Social Gatherings with Friends and Family: Not on file  . Attends Religious Services: Not on file  . Active Member of Clubs or Organizations: Not on file  . Attends Programme researcher, broadcasting/film/video Meetings: Not on file  . Marital Status: Not on file  Intimate Partner Violence:   . Fear of Current or Ex-Partner: Not on file  . Emotionally Abused: Not on file  . Physically Abused: Not on file  . Sexually Abused: Not on file   Family History  Problem Relation Age of Onset  . Coronary  artery disease Mother 43       MI  . Heart attack Mother   . Hypertension Mother   . Diabetes Mother   . Asthma Mother   . Coronary artery disease Father 49       MI  . Heart attack Father   . Hypertension Father   . Stroke Neg Hx     OBJECTIVE:  Vitals:   09/26/19 1315  BP: 120/84  Pulse: (!) 114  Resp: 20  Temp: 98.8 F (37.1 C)  TempSrc: Oral  SpO2: 93%  Weight: 233 lb 11 oz (106 kg)  Height: 5\' 7"  (1.702 m)     General appearance: alert; appears fatigued, but nontoxic; speaking in full sentences and tolerating own secretions HEENT: NCAT; Ears: EACs clear, TMs pearly gray; Eyes: PERRL.  EOM grossly intact. Sinuses: nontender; Nose: nares patent without rhinorrhea, Throat: oropharynx clear, tonsils non erythematous or enlarged, uvula midline  Neck: supple without LAD Lungs: unlabored respirations, symmetrical air entry; cough: mild; no respiratory distress; CTAB Heart: regular rate and rhythm.   Skin: warm and dry Psychological: alert and cooperative; normal mood and affect   ASSESSMENT & PLAN:  1. COVID-19 virus infection   2. Acute non-recurrent sinusitis, unspecified location   3. Cough   4. Body aches   5. Shortness of breath     Meds ordered this encounter  Medications  . doxycycline (VIBRAMYCIN) 100 MG capsule    Sig: Take 1 capsule (100 mg total) by mouth 2 (two) times daily.    Dispense:  20 capsule    Refill:  0    Order Specific Question:   Supervising Provider    Answer:    Eustace Moore  . meloxicam (MOBIC) 7.5 MG tablet    Sig: Take 1 tablet (7.5 mg total) by mouth daily.    Dispense:  30 tablet    Refill:  0    Order Specific Question:   Supervising Provider    Answer:   [[id number]] Eustace Moore   Unable to rule out blood clot in urgent care setting.  Offered patient further evaluation and management in the ED.  Patient declines at this time and would like to try outpatient therapy first.  Aware of the risk associated with this decision including missed diagnosis, organ damage, organ failure, and/or death.  Patient aware and in agreement.     Get plenty of rest and push fluids Doxycycline for sinus infection Mobic for body aches and pains Continue with at home breathing treatments and albuterol inhaler for shortness of breath.  We will hold off on oral steroids given elevated blood sugar Use OTC medications like ibuprofen or tylenol as needed fever or pain Follow up with COVID respiratory clinic for further evaluation and management of chronic covid symptoms Go to the ED if you have any new or worsening symptoms such as fever, worsening cough, shortness of breath, chest tightness, chest pain, turning blue, changes in mental status, etc...   Patient made aware that we do not manage chronic COVID symptoms.  Encouraged to follow up with COVID clinic.  Requests work note.  Aware that we DO NOT DO FMLA paperwork   Reviewed expectations re: course of current medical issues. Questions answered. Outlined signs and symptoms indicating need for more acute intervention. Patient verbalized understanding. After Visit Summary given.         [[id number]], PA-C 09/26/19 1341

## 2019-10-01 ENCOUNTER — Telehealth: Payer: Self-pay | Admitting: Cardiovascular Disease

## 2019-10-01 DIAGNOSIS — Z760 Encounter for issue of repeat prescription: Secondary | ICD-10-CM

## 2019-10-01 MED ORDER — METFORMIN HCL 500 MG PO TABS: 500.0000 mg | ORAL_TABLET | Freq: Two times a day (BID) | ORAL | 0 refills | Status: DC

## 2019-10-01 NOTE — Telephone Encounter (Signed)
Follow up:     Patient calling to check the status of her refill. Please call patient. Patient states her sugar is running about 300.

## 2019-10-01 NOTE — Telephone Encounter (Signed)
LMTCB regarding Metformin refill

## 2019-10-01 NOTE — Telephone Encounter (Signed)
*  STAT* If patient is at the pharmacy, call can be transferred to refill team.   1. Which medications need to be refilled? (please list name of each medication and dose if known) metFORMIN (GLUCOPHAGE) 500 MG tablet, 2 tablets twice a day  2. Which pharmacy/location (including street and city if local pharmacy) is medication to be sent to? [id number] [place], [place] - [address]  3. Do they need a 30 day or 90 day supply? 7 day supply   Patient is completely out of medication. She states her medication was increased by the hospital so she ran out. She states her appointment with her diabetic doctor is not until Friday, so she needs enough medication to make it to her appointment.

## 2019-10-01 NOTE — Telephone Encounter (Signed)
[facility]ed [facility]. They have a prescription on hold from August that they are going to get ready for her and will contact her to let her know.

## 2019-10-01 NOTE — Telephone Encounter (Signed)
Patient calling back stating she is very confused, because she called [facility] and they told her they do not have the medication.

## 2019-10-01 NOTE — Telephone Encounter (Signed)
I spoke w the patient and adv of my conversation w [facility]- that they have a metformin prescription on hold from Aug that they will refill.

## 2019-10-03 DIAGNOSIS — E7849 Other hyperlipidemia: Secondary | ICD-10-CM | POA: Diagnosis not present

## 2019-10-03 DIAGNOSIS — Z0000 Encounter for general adult medical examination without abnormal findings: Secondary | ICD-10-CM | POA: Diagnosis not present

## 2019-10-03 DIAGNOSIS — Z6837 Body mass index (BMI) 37.0-37.9, adult: Secondary | ICD-10-CM | POA: Diagnosis not present

## 2019-10-03 DIAGNOSIS — Z1331 Encounter for screening for depression: Secondary | ICD-10-CM | POA: Diagnosis not present

## 2019-10-03 DIAGNOSIS — J45909 Unspecified asthma, uncomplicated: Secondary | ICD-10-CM | POA: Diagnosis not present

## 2019-10-03 DIAGNOSIS — E119 Type 2 diabetes mellitus without complications: Secondary | ICD-10-CM | POA: Diagnosis not present

## 2019-10-04 DIAGNOSIS — Z3042 Encounter for surveillance of injectable contraceptive: Secondary | ICD-10-CM | POA: Diagnosis not present

## 2019-10-07 ENCOUNTER — Other Ambulatory Visit: Payer: Self-pay | Admitting: Cardiovascular Disease

## 2019-10-08 ENCOUNTER — Ambulatory Visit: Payer: BC Managed Care – PPO

## 2019-10-10 NOTE — Progress Notes (Deleted)
Cardiology Office Note    Date:  10/10/2019   ID:  Kristen Price, DOB 12/15/1975, MRN [id number]  PCP:  Shawnie Dapper, PA-C  Cardiologist: No primary care provider on file. EPS: None  No chief complaint on file.   History of Present Illness:  Kristen Price is a 44 y.o. female with history of NSTEMI 06/2014 treated with DES to proximal circumflex.  Normal LVEF 60% on 2D echo.  Last seen in our office 02/2015 at which time she had continued to smoke.  She was a no-show for appointment 02/2017 and Dr. Clifton James agreed to stop her Plavix.    I saw the patient 02/22/2018 which time I restarted Lipitor and ordered a lipid panel.  Advised smoking cessation, exercise and weight loss.  Patient had Covid earlier in September.    Past Medical History:  Diagnosis Date  . Anxiety   . Asthma   . Chronic lower back pain   . Depression   . Diabetes (HCC) 2019  . Family history of adverse reaction to anesthesia    "mom didn't want to wake up at all"  . GERD (gastroesophageal reflux disease)   . Hiatal hernia   . Hypertension   . NSTEMI (non-ST elevated myocardial infarction) (HCC) 06/06/2014   stent to circumfkex  . Obesity     Past Surgical History:  Procedure Laterality Date  . CARDIAC CATHETERIZATION N/A 06/07/2014   Procedure: Left Heart Cath and Coronary Angiography;  Surgeon: Lennette Bihari, MD;  Location: [facility] INVASIVE CV LAB;  Service: Cardiovascular;  Laterality: N/A;  . CHOLECYSTECTOMY  2019  . FEMUR FRACTURE SURGERY Left 2003   "metal rod runs from hip to knee"  . FRACTURE SURGERY    . HOODED EYE SURGERY      Current Medications: No outpatient medications have been marked as taking for the 10/16/19 encounter (Appointment) with Dyann Kief, PA-C.     Allergies:   Amoxicillin, Augmentin [amoxicillin-pot clavulanate], Benadryl [diphenhydramine], Losartan, and Penicillins   Social History   Socioeconomic History  . Marital status: Divorced    Spouse name: Not on file    . Number of children: 1  . Years of education: Not on file  . Highest education level: Not on file  Occupational History  . Occupation: Works from home for [employer]  . Smoking status: Former Smoker    Packs/day: 1.50    Years: 30.00    Pack years: 45.00    Types: Cigarettes  . Smokeless tobacco: Never Used  . Tobacco comment: she uses vapor 6mg  no tobacco  Vaping Use  . Vaping Use: Every day  Substance and Sexual Activity  . Alcohol use: Yes    Alcohol/week: 0.0 standard drinks    Comment: occasionally  . Drug use: No  . Sexual activity: Not Currently    Birth control/protection: Implant    Comment: Nexplanon   Other Topics Concern  . Not on file  Social History Narrative   Caffeine intake: "Mountain dew nut" can drink an 18 pack in 2-3 days   Exercise: No   Social Determinants of Health   Financial Resource Strain:   . Difficulty of Paying Living Expenses: Not on file  Food Insecurity:   . Worried About in the Last Year: Not on file  . Ran Out of Food in the Last Year: Not on file  Transportation Needs:   . Lack of Transportation (Medical): Not on file  . Lack  of Transportation (Non-Medical): Not on file  Physical Activity:   . Days of Exercise per Week: Not on file  . Minutes of Exercise per Session: Not on file  Stress:   . Feeling of Stress : Not on file  Social Connections:   . Frequency of Communication with Friends and Family: Not on file  . Frequency of Social Gatherings with Friends and Family: Not on file  . Attends Religious Services: Not on file  . Active Member of Clubs or Organizations: Not on file  . Attends Banker Meetings: Not on file  . Marital Status: Not on file     Family History:  The patient's ***family history includes Asthma in her mother; Coronary artery disease (age of onset: 77) in her father; Coronary artery disease (age of onset: 14) in her mother; Diabetes in her mother; Heart attack in  her father and mother; Hypertension in her father and mother.   ROS:   Please see the history of present illness.    ROS All other systems reviewed and are negative.   PHYSICAL EXAM:   VS:  LMP 09/25/2019   Physical Exam  GEN: Well nourished, well developed, in no acute distress  HEENT: normal  Neck: no JVD, carotid bruits, or masses Cardiac:RRR; no murmurs, rubs, or gallops  Respiratory:  clear to auscultation bilaterally, normal work of breathing GI: soft, nontender, nondistended, + BS Ext: without cyanosis, clubbing, or edema, Good distal pulses bilaterally MS: no deformity or atrophy  Skin: warm and dry, no rash Neuro:  Alert and Oriented x 3, Strength and sensation are intact Psych: euthymic mood, full affect  Wt Readings from Last 3 Encounters:  09/26/19 233 lb 11 oz (106 kg)  06/18/19 235 lb 3.2 oz (106.7 kg)  12/15/18 220 lb 7.4 oz (100 kg)      Studies/Labs Reviewed:   EKG:  EKG is*** ordered today.  The ekg ordered today demonstrates ***  Recent Labs: 09/21/2019: BUN 19; Creatinine, Ser 0.95; Hemoglobin 13.7; Platelets 311; Potassium 4.2; Sodium 132   Lipid Panel    Component Value Date/Time   CHOL 129 09/16/2014 1208   TRIG 108.0 09/16/2014 1208   HDL 30.80 (L) 09/16/2014 1208   CHOLHDL 4 09/16/2014 1208   VLDL 21.6 09/16/2014 1208   LDLCALC 77 09/16/2014 1208    Additional studies/ records that were reviewed today include:  2D echo 6/3/2016Study Conclusions   - Left ventricle: There may be mild decreased thickening at the   base of the inferior wall. The cavity size was normal. Wall   thickness was normal. The estimated ejection fraction was 60%. - Right ventricle: The cavity size was normal. Systolic function   was normal.    Cardiac catheterization 6/3/20161st Diag lesion, 20% stenosed.  Prox Cx lesion, 90% stenosed. There is a 0% residual stenosis post intervention.  A drug-eluting stent was placed.   Preserved global LV contractility with  an ejection fraction of 55% but with evidence for small focal region of mid to basal inferior hypocontractility.   Predominant single-vessel coronary obstructive disease with evidence for mild 20% narrowing in the very proximal first diagonal branch of the LAD with an otherwise normal LAD system; 90% percent eccentric stenosis in the proximal left circumflex coronary artery arising immediately after the left atrial circumflex branch and a dominant left circumflex system.  There is  filling of the small distal circumflex vessel from this left atrial circumflex branch which contains a small focal aneurysm; and normal  nondominant RCA.   Successful PCI of the left circumflex coronary artery with insertion of a 3.518 mm Resolute DES stent postdilated to 3.76 mm with the 90% stenosis being reduced to 0%.   RECOMMENDATION:   The patient will continue with antiplatelets therapy for minimum of a year.  She was screened to participate in the Twilight study involving Brilinta/aspirin.  Smoking cessation is imperative.  Aggressive lipid-lowering therapy will be instituted along with medical therapy for her CAD.       ASSESSMENT:    1. Coronary artery disease involving native coronary artery of native heart without angina pectoris   2. Tobacco abuse   3. Hyperlipidemia, unspecified hyperlipidemia type   4. Type 2 diabetes mellitus with hyperosmolarity without coma, without long-term current use of insulin (HCC)   5. Obesity (BMI 35.0-39.9 without comorbidity)      PLAN:  In order of problems listed above:  CAD status post NSTEMI 06/2014 treated with DES to the proximal circumflex, normal LVEF 60% on echo.  Tobacco abuse  Hyperlipidemia  Diabetes mellitus  Obesity   Medication Adjustments/Labs and Tests Ordered: Current medicines are reviewed at length with the patient today.  Concerns regarding medicines are outlined above.  Medication changes, Labs and Tests ordered today are listed in the  Patient Instructions below. There are no Patient Instructions on file for this visit.   Elson Clan, PA-C  10/10/2019 3:08 PM    [facility] [address] [place], [place], [place]  [address] Phone: [phone]; Fax: [phone]

## 2019-10-15 ENCOUNTER — Telehealth: Payer: Self-pay | Admitting: Cardiovascular Disease

## 2019-10-15 NOTE — Telephone Encounter (Signed)
Kristen Price, there is no message in this encounter.

## 2019-10-16 ENCOUNTER — Ambulatory Visit: Payer: BC Managed Care – PPO | Admitting: Physician Assistant

## 2019-10-22 ENCOUNTER — Other Ambulatory Visit: Payer: Self-pay | Admitting: Cardiovascular Disease

## 2019-10-24 ENCOUNTER — Emergency Department (HOSPITAL_COMMUNITY): Payer: BC Managed Care – PPO

## 2019-10-24 ENCOUNTER — Telehealth: Payer: Self-pay | Admitting: Physician Assistant

## 2019-10-24 ENCOUNTER — Other Ambulatory Visit: Payer: Self-pay

## 2019-10-24 ENCOUNTER — Emergency Department (HOSPITAL_COMMUNITY)
Admission: EM | Admit: 2019-10-24 | Discharge: 2019-10-24 | Discharge status: Against Medical Advice | Payer: BC Managed Care – PPO | Attending: Emergency Medicine | Admitting: Emergency Medicine

## 2019-10-24 ENCOUNTER — Encounter (HOSPITAL_COMMUNITY): Payer: Self-pay | Admitting: Emergency Medicine

## 2019-10-24 DIAGNOSIS — Z955 Presence of coronary angioplasty implant and graft: Secondary | ICD-10-CM | POA: Insufficient documentation

## 2019-10-24 DIAGNOSIS — Z7951 Long term (current) use of inhaled steroids: Secondary | ICD-10-CM | POA: Insufficient documentation

## 2019-10-24 DIAGNOSIS — Z87891 Personal history of nicotine dependence: Secondary | ICD-10-CM | POA: Insufficient documentation

## 2019-10-24 DIAGNOSIS — J45909 Unspecified asthma, uncomplicated: Secondary | ICD-10-CM | POA: Insufficient documentation

## 2019-10-24 DIAGNOSIS — I2510 Atherosclerotic heart disease of native coronary artery without angina pectoris: Secondary | ICD-10-CM | POA: Insufficient documentation

## 2019-10-24 DIAGNOSIS — Z7982 Long term (current) use of aspirin: Secondary | ICD-10-CM | POA: Diagnosis not present

## 2019-10-24 DIAGNOSIS — I10 Essential (primary) hypertension: Secondary | ICD-10-CM | POA: Insufficient documentation

## 2019-10-24 DIAGNOSIS — Z7984 Long term (current) use of oral hypoglycemic drugs: Secondary | ICD-10-CM | POA: Diagnosis not present

## 2019-10-24 DIAGNOSIS — R079 Chest pain, unspecified: Secondary | ICD-10-CM | POA: Diagnosis not present

## 2019-10-24 DIAGNOSIS — E119 Type 2 diabetes mellitus without complications: Secondary | ICD-10-CM | POA: Insufficient documentation

## 2019-10-24 LAB — BASIC METABOLIC PANEL
Anion gap: 7 (ref 5–15)
BUN: 10 mg/dL (ref 6–20)
CO2: 24 mmol/L (ref 22–32)
Calcium: 8.7 mg/dL — ABNORMAL LOW (ref 8.9–10.3)
Chloride: 105 mmol/L (ref 98–111)
Creatinine, Ser: 0.73 mg/dL (ref 0.44–1.00)
GFR, Estimated: >60 mL/min (ref >60)
Glucose, Bld: 129 mg/dL — ABNORMAL HIGH (ref 70–99)
Potassium: 3.7 mmol/L (ref 3.5–5.1)
Sodium: 136 mmol/L (ref 135–145)

## 2019-10-24 LAB — CBC
HCT: 38.8 % (ref 36.0–46.0)
Hemoglobin: 12.5 g/dL (ref 12.0–15.0)
MCH: 30.7 pg (ref 26.0–34.0)
MCHC: 32.2 g/dL (ref 30.0–36.0)
MCV: 95.3 fL (ref 80.0–100.0)
Platelets: 251 10*3/uL (ref 150–400)
RBC: 4.07 MIL/uL (ref 3.87–5.11)
RDW: 13.6 % (ref 11.5–15.5)
WBC: 8.1 10*3/uL (ref 4.0–10.5)
nRBC: 0 % (ref 0.0–0.2)

## 2019-10-24 LAB — TROPONIN I (HIGH SENSITIVITY): Troponin I (High Sensitivity): 4 ng/L (ref <18)

## 2019-10-24 LAB — POC URINE PREG, ED: Preg Test, Ur: NEGATIVE

## 2019-10-24 LAB — D-DIMER, QUANTITATIVE: D-Dimer, Quant: 0.44 ug/mL-FEU (ref 0.00–0.50)

## 2019-10-24 NOTE — Discharge Instructions (Addendum)
You were seen in the emergency department for chest pain.  You had an EKG and a chest x-ray that did not show an obvious explanation for your symptoms.  Unfortunately you had to leave and did not wait for the results of your other test.  Please return to the emergency department for further testing when you are able.  Follow-up with your doctor.  Please call the nurse today when you get home and we can only see if your results were abnormal.  Return if any concerns.

## 2019-10-24 NOTE — ED Triage Notes (Signed)
Pt c/o of left sided cp since 2 days ago. Pt took 325 of ASA at home

## 2019-10-24 NOTE — ED Provider Notes (Signed)
[facility] EMERGENCY DEPARTMENT Provider Note   CSN: [id number] Arrival date & time: 10/24/19  [id number]     History Chief Complaint  Patient presents with  . Chest Pain    Kristen Price is a 44 y.o. female.  She has a history of diabetes and an NSTEMI with a stent.  Had Covid last month.  He has been doing well from that.  For the last 2 days she has noticed some vague upper left chest discomfort.  Different than her cardiac pain.  She calls it nagging and it just does not go away.  She has been taking her regular medications but tried nothing else for it.  She called EMS today and they told her she should come get checked out in the emergency department.  No shortness of breath fevers chills cough diaphoresis nausea vomiting.  No trauma.  Not reproducible.  The history is provided by the patient.  Chest Pain Pain location:  L chest Pain quality: aching   Pain radiates to:  Does not radiate Pain severity:  Mild Onset quality:  Gradual Duration:  2 days Timing:  Constant Progression:  Unchanged Chronicity:  New Relieved by:  None tried Worsened by:  Nothing Ineffective treatments:  None tried Associated symptoms: no abdominal pain, no back pain, no fever, no headache and no shortness of breath   Risk factors: coronary artery disease, diabetes mellitus and high cholesterol        Past Medical History:  Diagnosis Date  . Anxiety   . Asthma   . Chronic lower back pain   . Depression   . Diabetes (HCC) 2019  . Family history of adverse reaction to anesthesia    "mom didn't want to wake up at all"  . GERD (gastroesophageal reflux disease)   . Hiatal hernia   . Hypertension   . NSTEMI (non-ST elevated myocardial infarction) (HCC) 06/06/2014   stent to circumfkex  . Obesity     Patient Active Problem List   Diagnosis Date Noted  . Diabetes mellitus (HCC) 02/22/2018  . Obesity (BMI 35.0-39.9 without comorbidity) 02/22/2018  . Mild intermittent asthma without complication  03/06/2017  . Conductive hearing loss of left ear 08/19/2015  . Gallbladder disease 03/03/2015  . Coronary artery disease involving native coronary artery of native heart without angina pectoris 06/24/2014  . Elevated troponin   . History of non-ST elevation myocardial infarction (NSTEMI) 06/06/2014  . Tobacco abuse 06/06/2014  . Chronic low back pain 10/25/2011  . GAD (generalized anxiety disorder) 09/27/2011  . Insomnia 09/27/2011    Past Surgical History:  Procedure Laterality Date  . CARDIAC CATHETERIZATION N/A 06/07/2014   Procedure: Left Heart Cath and Coronary Angiography;  Surgeon: Lennette Biharihomas A Kelly, MD;  [facility];  Service: Cardiovascular;  Laterality: N/A;  . CHOLECYSTECTOMY  2019  . FEMUR FRACTURE SURGERY Left 2003   "metal rod runs from hip to knee"  . FRACTURE SURGERY    . HOODED EYE SURGERY       OB History    Gravida  1   Para  1   Term  1   Preterm      AB      Living        SAB      TAB      Ectopic      Multiple      Live Births              Family History  Problem Relation Age of Onset  . Coronary artery disease Mother 62       MI  . Heart attack Mother   . Hypertension Mother   . Diabetes Mother   . Asthma Mother   . Coronary artery disease Father 48       MI  . Heart attack Father   . Hypertension Father   . Stroke Neg Hx     Social History   Tobacco Use  . Smoking status: Former Smoker    Packs/day: 1.50    Years: 30.00    Pack years: 45.00    Types: Cigarettes  . Smokeless tobacco: Never Used  . Tobacco comment: she uses vapor 6mg  no tobacco  Vaping Use  . Vaping Use: Every day  Substance Use Topics  . Alcohol use: Yes    Alcohol/week: 0.0 standard drinks    Comment: occasionally  . Drug use: No    Home Medications Prior to Admission medications   Medication Sig Start Date End Date Taking? Authorizing Provider  albuterol (VENTOLIN HFA) 108 (90 Base) MCG/ACT inhaler Inhale 1-2 puffs into the  lungs every 6 (six) hours as needed for wheezing or shortness of breath. 09/21/19   09/23/19, NP  aspirin EC 81 MG EC tablet Take 1 tablet (81 mg total) by mouth daily. 06/08/14   08/08/14, PA-C  atorvastatin (LIPITOR) 80 MG tablet Take 1 tablet (80 mg total) by mouth daily. Please keep upcoming appt in November before anymore refills or refill with PCP. Final Attempt 10/24/19   10/26/19, MD  budesonide-formoterol [facility]) 160-4.5 MCG/ACT inhaler Inhale 2 puffs into the lungs 2 (two) times daily. 09/21/19   09/23/19, NP  cetirizine (ZYRTEC ALLERGY) 10 MG tablet Take 1 tablet (10 mg total) by mouth daily. 09/14/19   Avegno, 11/14/19, FNP  doxycycline (VIBRAMYCIN) 100 MG capsule Take 1 capsule (100 mg total) by mouth 2 (two) times daily. 09/26/19   Wurst, 09/28/19, PA-C  fluticasone (FLONASE) 50 MCG/ACT nasal spray Place 1 spray into both nostrils daily for 14 days. 09/14/19 09/28/19  Avegno, 09/30/19, FNP  ipratropium-albuterol (DUONEB) 0.5-2.5 (3) MG/3ML SOLN Take 3 mLs by nebulization every 4 (four) hours as needed. 09/21/19   09/23/19, NP  meclizine (ANTIVERT) 50 MG tablet Take 1 tablet (50 mg total) by mouth 3 (three) times daily as needed. 06/18/19   Avegno, 06/20/19, FNP  meloxicam (MOBIC) 7.5 MG tablet Take 1 tablet (7.5 mg total) by mouth daily. 09/26/19   Wurst, 09/28/19, PA-C  metFORMIN (GLUCOPHAGE) 500 MG tablet Take by mouth 2 (two) times daily with a meal.    [provider]  montelukast (SINGULAIR) 10 MG tablet Take 1 tablet (10 mg total) by mouth at bedtime. Patient needs office visit 06/18/19   06/20/19, FNP  Nebulizers (CLEVER CHOICE NEBULIZER) MISC 1 each by Does not apply route as needed (Use nebulizer as needed for wheezing and cough). 04/13/19   Avegno, 06/13/19, FNP  nitrofurantoin, macrocrystal-monohydrate, (MACROBID) 100 MG capsule Take 1 capsule (100 mg total) by mouth 2 (two) times daily. 08/15/19   Wurst,  10/15/19, PA-C  nitroGLYCERIN (NITROSTAT) 0.4 MG SL tablet Place 1 tablet (0.4 mg total) under the tongue every 5 (five) minutes x 3 doses as needed for chest pain. Patient not taking: Reported on 01/10/2019 02/22/18   02/24/18, PA-C  omeprazole (PRILOSEC) 20 MG capsule Take 1 capsule (20 mg total) by mouth  in the morning and at bedtime. 08/15/19   Wurst, [place], PA-C    Allergies    Amoxicillin, Augmentin [amoxicillin-pot clavulanate], Benadryl [diphenhydramine], Losartan, and Penicillins  Review of Systems   Review of Systems  Constitutional: Negative for fever.  HENT: Negative for sore throat.   Eyes: Negative for visual disturbance.  Respiratory: Negative for shortness of breath.   Cardiovascular: Positive for chest pain. Negative for leg swelling.  Gastrointestinal: Negative for abdominal pain.  Genitourinary: Negative for dysuria.  Musculoskeletal: Negative for back pain.  Skin: Negative for rash.  Neurological: Negative for headaches.    Physical Exam Updated Vital Signs BP (!) 142/84   Pulse 79   Resp 14   Ht 5\' 7"  (1.702 m)   Wt 105.7 kg   LMP 09/25/2019   SpO2 99%   BMI 36.49 kg/m   Physical Exam Vitals and nursing note reviewed.  Constitutional:      General: She is not in acute distress.    Appearance: She is well-developed.  HENT:     Head: Normocephalic and atraumatic.  Eyes:     Conjunctiva/sclera: Conjunctivae normal.  Cardiovascular:     Rate and Rhythm: Normal rate and regular rhythm.     Heart sounds: Normal heart sounds. No murmur heard.   Pulmonary:     Effort: Pulmonary effort is normal. No respiratory distress.     Breath sounds: Normal breath sounds.  Abdominal:     Palpations: Abdomen is soft.     Tenderness: There is no abdominal tenderness.  Musculoskeletal:        General: Normal range of motion.     Cervical back: Neck supple.     Right lower leg: No tenderness. No edema.     Left lower leg: No tenderness. No edema.   Skin:    General: Skin is warm and dry.     Capillary Refill: Capillary refill takes less than 2 seconds.  Neurological:     General: No focal deficit present.     Mental Status: She is alert.     ED Results / Procedures / Treatments   Labs (all labs ordered are listed, but only abnormal results are displayed) Labs Reviewed  BASIC METABOLIC PANEL - Abnormal; Notable for the following components:      Result Value   Glucose, Bld 129 (*)    Calcium 8.7 (*)    All other components within normal limits  CBC  D-DIMER, QUANTITATIVE (NOT AT [facility])  POC URINE PREG, ED  TROPONIN I (HIGH SENSITIVITY)    EKG EKG Interpretation  Date/Time:  Wednesday October 24 2019 10:06:27 EDT Ventricular Rate:  87 PR Interval:  140 QRS Duration: 78 QT Interval:  354 QTC Calculation: 425 R Axis:   78 Text Interpretation: Probable sinus Otherwise normal ECG No significant change since prior 2/17 Confirmed by 3/17 [id number]) on 10/24/2019 10:27:19 AM   Radiology DG Chest 2 View  Result Date: 10/24/2019 CLINICAL DATA:  Chest pain.  COVID positive 09/14/2019. EXAM: CHEST - 2 VIEW COMPARISON:  12/15/2018 FINDINGS: Cardiomediastinal contours within normal limits. Subtle interstitial opacities at the peripheral aspect of the lung bases. No confluent consolidation. No pleural effusions or pneumothorax. No acute osseous abnormality. IMPRESSION: Subtle interstitial opacities at the peripheral aspect of the lung bases, which are new from prior and concerning for atypical pneumonia (possibly related to patient's recent COVID diagnosis). No confluent consolidation. Electronically Signed   By: 14/11/2018 MD   On: 10/24/2019 10:32  Procedures Procedures (including critical care time)  Medications Ordered in ED Medications - No data to display  ED Course  I have reviewed the triage vital signs and the nursing notes.  Pertinent labs & imaging results that were available during my care of  the patient were reviewed by me and considered in my medical decision making (see chart for details).  Clinical Course as of Oct 24 1754  Wed Oct 24, 2019  1314 I was informed by the nurse that the patient needs to go pick up her son because he was getting discharged from school because he had vomited.  She is going to sign out.  I updated her that her troponin and dimer are not back and so she would be signing out AGAINST MEDICAL ADVICE.  She understands this.  I asked that she either return when she gets her son safely home or she at least checks in with the nurse and make sure that her tests are not abnormal.   [MB]    Clinical Course User Index [MB] Terrilee Files, MD   MDM Rules/Calculators/A&P                         This patient complains of left upper chest pain; this involves an extensive number of treatment Options and is a complaint that carries with it a high risk of complications and Morbidity. The differential includes musculoskeletal, reflux, ACS, pneumonia, PE, pneumothorax  I ordered, reviewed and interpreted labs, which included CBC with normal white count normal hemoglobin, chemistries normal other than mildly elevated glucose, D-dimer unremarkable, 1 troponin low, pregnancy test negative I ordered imaging studies which included chest x-ray and I independently    visualized and interpreted imaging which showed no acute infiltrates, radiology called subtle opacities possibly related to recent Covid infection Previous records obtained and reviewed in epic, no recent admissions  After the interventions stated above, I reevaluated the patient and found patient still to be having minimal symptoms.  Her work-up was incomplete but she needed to leave to pick up her son.  Recommended she return to the emergency department for continued work-up of her symptoms.  Return instructions discussed.   Final Clinical Impression(s) / ED Diagnoses Final diagnoses:  Nonspecific chest pain     Rx / DC Orders ED Discharge Orders    None       Terrilee Files, MD 10/24/19 1758

## 2019-10-24 NOTE — Telephone Encounter (Signed)
New message:    Patient called the EMT this morning, because of chest pains, they did not see anything wrong. But patient would like to get apt soon. EMS are still there.

## 2019-10-24 NOTE — Telephone Encounter (Signed)
Agree that she needs to go to the ED. 

## 2019-10-24 NOTE — Telephone Encounter (Signed)
Patient is calling in stating that she called EMS this morning due to chest discomfort. EMS is currently still at her residence and urging her to go to the ED to be evaluated. The patient is calling our office to get advisement on her symptoms. Patient reports chest discomfort that has been going on since yesterday. States pain is currently 1/10. Denies any pain radiating, SOB or diaphoresis. Pt states that EKG done by EMS did not show anything. Patient was diagnosed with COVID back in September and states she is in fear of going to the ED because she does not want to get COVID again. Patient requesting same day appointment. Advised patient that with her history of previous MI and stent, she should go to the ED to be evaluated immediately as our office is unable to provide care for acute chest pain. Patient is still hesitant to go. Encouraged patient to listen to EMT who is continuing to suggest she be checked out by ED. Patient declined going to the ED at this time. Reiterated the importance of being seen by emergency personnel. Patient verbalized understanding and continues to decline.

## 2019-11-03 ENCOUNTER — Encounter: Payer: Self-pay | Admitting: Physician Assistant

## 2019-11-03 NOTE — Progress Notes (Addendum)
Cardiology Office Note    Date:  11/06/2019   ID:  Kristen Price, DOB Aug 23, 1975, MRN [id number]  PCP:  Shawnie Dapper, PA-C  Cardiologist:  Verne Carrow, MD  Electrophysiologist:  None   Chief Complaint: overdue f/u CAD (last OV 02/2018)  History of Present Illness:   Kristen Price is a 44 y.o. female with history of CAD/NSTEMI 2016 status post drug-eluting stent to be proximal circumflex, diabetes, former tobacco abuse, anxiety, depression, chronic lower back pain, GERD, obesity who presents for overdue follow-up. At time of her NSTEMI/cardiac catheterization the only other disease she had was 20% first diagonal.  2D echocardiogram 06/2014 showed EF 60% with mild decreased thickening at the base of the inferior wall, otherwise unremarkable.  She was a no-show for her appointment in 2019 and Dr. Clifton James agreed to stop her Plavix.  She was last seen in our office in February 2020.  Atorvastatin was restarted with recommendation to return for repeat labs but do not see this occurred. She had Covid in September 2021.  She was seen in the ED 10/24/2019 with atypical chest pain but left AMA. D-dimer and troponin were normal.  She is seen for follow-up today overall feeling okay. For the past several weeks she has noticed intermittent atypical chest pains. She has a very difficult time quantifying them. The discomfort she had had that prompted her emergency room visit was 3 days in a row. The first 2 days it would come and go and the third day it was fairly constant. This occurs exclusively when she is sitting still and not distracted with other things such as working. When she is up moving around or doing housework, she does not feel this. It is not exacerbated/precipitated by anything. She has a hard time describing the sensation. It is always located in her left upper chest wall and does not radiate. It happens a few times a week. Sometimes she has a brief sharp fleeting sensation that she  felt briefly this morning. It is not associated with any other symptoms. Her blood pressure is elevated today but this is unusual for her. She states it is normal at home, below 130 systolic. There was apparently some confusion this morning for scheduling had told her to arrive much earlier than her appointment time for lab work for unclear reasons.  Labwork independently reviewed: 10/2019 d-dimer wnl, troponin negative, CBC wnl, glucose 129, Cr 0.73, K 3.7   Past Medical History:  Diagnosis Date  . Anxiety   . Asthma   . CAD (coronary artery disease)    a. CAD/NSTEMI 2016 status post drug-eluting stent to be proximal circumfle.  . Chronic lower back pain   . Depression   . Diabetes (HCC) 2019  . Family history of adverse reaction to anesthesia    "mom didn't want to wake up at all"  . GERD (gastroesophageal reflux disease)   . Hiatal hernia   . HLD (hyperlipidemia)   . Hypertension   . Obesity     Past Surgical History:  Procedure Laterality Date  . CARDIAC CATHETERIZATION N/A 06/07/2014   Procedure: Left Heart Cath and Coronary Angiography;  Surgeon: Lennette Bihari, MD;  [facility] INVASIVE CV LAB;  Service: Cardiovascular;  Laterality: N/A;  . CHOLECYSTECTOMY  2019  . FEMUR FRACTURE SURGERY Left 2003   "metal rod runs from hip to knee"  . FRACTURE SURGERY    . HOODED EYE SURGERY      Current Medications: Current Meds  Medication  Sig  . albuterol (VENTOLIN HFA) 108 (90 Base) MCG/ACT inhaler Inhale 1-2 puffs into the lungs every 6 (six) hours as needed for wheezing or shortness of breath.  . Ascorbic Acid (VITAMIN C) 1000 MG tablet Take 1,000 mg by mouth in the morning and at bedtime.  Marland Kitchen. aspirin EC 81 MG EC tablet Take 1 tablet (81 mg total) by mouth daily.  Marland Kitchen. atorvastatin (LIPITOR) 80 MG tablet Take 1 tablet (80 mg total) by mouth daily. Please keep upcoming appt in November before anymore refills or refill with PCP. Final Attempt  . budesonide-formoterol (SYMBICORT)  160-4.5 MCG/ACT inhaler Inhale 2 puffs into the lungs 2 (two) times daily.  . calcium-vitamin D 250-100 MG-UNIT tablet Take 1 tablet by mouth 2 (two) times daily.  . cetirizine (ZYRTEC ALLERGY) 10 MG tablet Take 1 tablet (10 mg total) by mouth daily.  Marland Kitchen. ipratropium-albuterol (DUONEB) 0.5-2.5 (3) MG/3ML SOLN Take 3 mLs by nebulization every 4 (four) hours as needed.  . meclizine (ANTIVERT) 50 MG tablet Take 1 tablet (50 mg total) by mouth 3 (three) times daily as needed.  . meloxicam (MOBIC) 7.5 MG tablet Take 1 tablet (7.5 mg total) by mouth daily.  . metFORMIN (GLUCOPHAGE) 500 MG tablet Take by mouth 2 (two) times daily with a meal.  . montelukast (SINGULAIR) 10 MG tablet Take 1 tablet (10 mg total) by mouth at bedtime. Patient needs office visit  . Nebulizers (CLEVER CHOICE NEBULIZER) MISC 1 each by Does not apply route as needed (Use nebulizer as needed for wheezing and cough).  . nitroGLYCERIN (NITROSTAT) 0.4 MG SL tablet Place 1 tablet (0.4 mg total) under the tongue every 5 (five) minutes x 3 doses as needed for chest pain.  Marland Kitchen. omeprazole (PRILOSEC) 20 MG capsule Take 1 capsule (20 mg total) by mouth in the morning and at bedtime.  . Zinc 50 MG TABS Take 50 mg by mouth daily.      Allergies:   Amoxicillin, Augmentin [amoxicillin-pot clavulanate], Benadryl [diphenhydramine], and Penicillins   Social History   Socioeconomic History  . Marital status: Divorced    Spouse name: Not on file  . Number of children: 1  . Years of education: Not on file  . Highest education level: Not on file  Occupational History  . Occupation: Works from home for [employer]  Tobacco Use  . Smoking status: Former Smoker    Packs/day: 1.50    Years: 30.00    Pack years: 45.00    Types: Cigarettes  . Smokeless tobacco: Never Used  . Tobacco comment: she uses vapor 6mg  no tobacco  Vaping Use  . Vaping Use: Every day  Substance and Sexual Activity  . Alcohol use: Yes    Alcohol/week: 0.0 standard drinks     Comment: occasionally  . Drug use: No  . Sexual activity: Not Currently    Birth control/protection: Implant    Comment: Nexplanon   Other Topics Concern  . Not on file  Social History Narrative   Caffeine intake: "Mountain dew nut" can drink an 18 pack in 2-3 days   Exercise: No   Social Determinants of Health   Financial Resource Strain:   . Difficulty of Paying Living Expenses: Not on file  Food Insecurity:   . Worried About Programme researcher, broadcasting/film/videounning Out of Food in the Last Year: Not on file  . Ran Out of Food in the Last Year: Not on file  Transportation Needs:   . Lack of Transportation (Medical): Not on file  .  Lack of Transportation (Non-Medical): Not on file  Physical Activity:   . Days of Exercise per Week: Not on file  . Minutes of Exercise per Session: Not on file  Stress:   . Feeling of Stress : Not on file  Social Connections:   . Frequency of Communication with Friends and Family: Not on file  . Frequency of Social Gatherings with Friends and Family: Not on file  . Attends Religious Services: Not on file  . Active Member of Clubs or Organizations: Not on file  . Attends Banker Meetings: Not on file  . Marital Status: Not on file     Family History:  The patient's family history includes Asthma in her mother; Coronary artery disease (age of onset: 4) in her father; Coronary artery disease (age of onset: 50) in her mother; Diabetes in her mother; Heart attack in her father and mother; Hypertension in her father and mother. There is no history of Stroke.  ROS:   Please see the history of present illness. Otherwise, review of systems is positive for rare occasional fleeting leg pains without swelling, cramping, redness, or claudication. All other systems are reviewed and otherwise negative.    EKGs/Labs/Other Studies Reviewed:    Studies reviewed are outlined and summarized above. Reports included below if pertinent.  2D echo 2016 - Left ventricle: There may be  mild decreased thickening at the  base of the inferior wall. The cavity size was normal. Wall  thickness was normal. The estimated ejection fraction was 60%.  - Right ventricle: The cavity size was normal. Systolic function  was normal.   LHC 2016  1st Diag lesion, 20% stenosed.  Prox Cx lesion, 90% stenosed. There is a 0% residual stenosis post intervention.  A drug-eluting stent was placed.   Preserved global LV contractility with an ejection fraction of 55% but with evidence for small focal region of mid to basal inferior hypocontractility.  Predominant single-vessel coronary obstructive disease with evidence for mild 20% narrowing in the very proximal first diagonal branch of the LAD with an otherwise normal LAD system; 90% percent eccentric stenosis in the proximal left circumflex coronary artery arising immediately after the left atrial circumflex branch and a dominant left circumflex system.  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  Successful PCI of the left circumflex coronary artery with insertion of a 3.518 mm Resolute DES stent postdilated to 3.76 mm with the 90% stenosis being reduced to 0%.  RECOMMENDATION:  The patient will continue with antiplatelets therapy for minimum of a year.  She was screened to participate in the Twilight study involving Brilinta/aspirin.  Smoking cessation is imperative.  Aggressive lipid-lowering therapy will be instituted along with medical therapy for her CAD.     EKG:  EKG is ordered today, personally reviewed, demonstrating NSR 86bpm, no acute STT changes  Recent Labs: 10/24/2019: BUN 10; Creatinine, Ser 0.73; Hemoglobin 12.5; Platelets 251; Potassium 3.7; Sodium 136  Recent Lipid Panel    Component Value Date/Time   CHOL 129 09/16/2014 1208   TRIG 108.0 09/16/2014 1208   HDL 30.80 (L) 09/16/2014 1208    CHOLHDL 4 09/16/2014 1208   VLDL 21.6 09/16/2014 1208   LDLCALC 77 09/16/2014 1208    PHYSICAL EXAM:    VS:  BP (!) 150/90   Pulse 86   Ht 5\' 7"  (1.702 m)   Wt 244  lb 9.6 oz (110.9 kg)   SpO2 100%   BMI 38.31 kg/m   BMI: Body mass index is 38.31 kg/m.  GEN: Well nourished, well developed WF, in no acute distress HEENT: normocephalic, atraumatic Neck: no JVD, carotid bruits, or masses Cardiac: RRR; no murmurs, rubs, or gallops, no edema  Respiratory:  clear to auscultation bilaterally, normal work of breathing GI: soft, nontender, nondistended, + BS MS: no deformity or atrophy Skin: warm and dry, no rash Neuro:  Alert and Oriented x 3, Strength and sensation are intact, follows commands Psych: euthymic mood, full affect  Wt Readings from Last 3 Encounters:  11/06/19 244 lb 9.6 oz (110.9 kg)  10/24/19 233 lb (105.7 kg)  09/26/19 233 lb 11 oz (106 kg)     ASSESSMENT & PLAN:   1. CAD with atypical chest pain -recently seen in the ED for atypical chest pain with negative troponin and negative D-dimer. EKG is unrevealing. Suspect this is likely musculoskeletal/neuropathic but given her diabetes and coronary history, will pursue nuclear stress testing to exclude atypical angina. Given her chronic back issues, will choose Lexiscan over exercise. Risks, benefits, and objectives of study were discussed with the patient who is agreeable to proceed. She would like to wait to see what her insurance covers for the study. Continue aspirin, statin, and refill nitroglycerin. See below regarding blood pressure. 2. Hyperlipidemia - due for LFTs and lipids today, will obtain. She is fasting. 3. Former tobacco abuse - quit 1 year ago, still occasionally uses vape product. Encouraged continued abstinence from tobacco/nicotine products. 4. Elevated blood pressure without diagnosis of HTN -suspect her blood pressure was elevated due to the stress of the misunderstanding with arriving early. I rechecked  it at the end of the office visit and it was 133/82. Her blood pressure was previously normal in other encounters. Instructions relayed to the patient for checking at home and calling if she is seeing tendency for >130 systolic or >80 diastolic. She was not previously on beta blocker due to soft BP per notes. If BP comes to require therapy for control, could consider initiation of BB but would need to keep in mind hx of lung issues requiring chronic inhalers. ARB would be another consideration. She had cough listed in her hx with losartan but the patient refuted this and requested removal today.  Disposition: F/u with Dr. Clifton James in 1 year (sooner if nuclear stress test abnormal or symptoms evolve).  Medication Adjustments/Labs and Tests Ordered: Current medicines are reviewed at length with the patient today.  Concerns regarding medicines are outlined above. Medication changes, Labs and Tests ordered today are summarized above and listed in the Patient Instructions accessible in Encounters.   Signed, Laurann Montana, PA-C  11/06/2019 8:20 AM    [facility] [address] [place], [place], [place]  [address] Phone: [phone]; Fax: [phone]

## 2019-11-06 ENCOUNTER — Encounter: Payer: Self-pay | Admitting: *Deleted

## 2019-11-06 ENCOUNTER — Other Ambulatory Visit: Payer: Self-pay

## 2019-11-06 ENCOUNTER — Other Ambulatory Visit: Payer: BC Managed Care – PPO

## 2019-11-06 ENCOUNTER — Ambulatory Visit: Payer: BC Managed Care – PPO | Admitting: Physician Assistant

## 2019-11-06 ENCOUNTER — Encounter: Payer: Self-pay | Admitting: Physician Assistant

## 2019-11-06 VITALS — BP 150/90 | HR 86 | Ht 67.0 in | Wt 244.6 lb

## 2019-11-06 DIAGNOSIS — R0789 Other chest pain: Secondary | ICD-10-CM

## 2019-11-06 DIAGNOSIS — E785 Hyperlipidemia, unspecified: Secondary | ICD-10-CM

## 2019-11-06 DIAGNOSIS — I2510 Atherosclerotic heart disease of native coronary artery without angina pectoris: Secondary | ICD-10-CM

## 2019-11-06 DIAGNOSIS — Z87891 Personal history of nicotine dependence: Secondary | ICD-10-CM | POA: Diagnosis not present

## 2019-11-06 DIAGNOSIS — R030 Elevated blood-pressure reading, without diagnosis of hypertension: Secondary | ICD-10-CM

## 2019-11-06 LAB — LIPID PANEL
Chol/HDL Ratio: 3.8 ratio (ref 0.0–4.4)
Cholesterol, Total: 146 mg/dL (ref 100–199)
HDL: 38 mg/dL — ABNORMAL LOW (ref >39)
LDL Chol Calc (NIH): 86 mg/dL (ref 0–99)
Triglycerides: 124 mg/dL (ref 0–149)
VLDL Cholesterol Cal: 22 mg/dL (ref 5–40)

## 2019-11-06 LAB — HEPATIC FUNCTION PANEL
ALT: 15 IU/L (ref 0–32)
AST: 13 IU/L (ref 0–40)
Albumin: 4.3 g/dL (ref 3.8–4.8)
Alkaline Phosphatase: 62 IU/L (ref 44–121)
Bilirubin Total: 0.3 mg/dL (ref 0.0–1.2)
Bilirubin, Direct: <0.1 mg/dL (ref 0.00–0.40)
Total Protein: 6.8 g/dL (ref 6.0–8.5)

## 2019-11-06 MED ORDER — NITROGLYCERIN 0.4 MG SL SUBL: 0.4000 mg | SUBLINGUAL_TABLET | SUBLINGUAL | 3 refills | Status: AC | PRN

## 2019-11-06 MED ORDER — ATORVASTATIN CALCIUM 80 MG PO TABS: 80.0000 mg | ORAL_TABLET | Freq: Every day | ORAL | 3 refills | Status: AC

## 2019-11-06 NOTE — Patient Instructions (Addendum)
Medication Instructions:  Your physician recommends that you continue on your current medications as directed. Please refer to the Current Medication list given to you today.  *If you need a refill on your cardiac medications before your next appointment, please call your pharmacy*   Lab Work: TODAY:  LIPID & LFT  If you have labs (blood work) drawn today and your tests are completely normal, you will receive your results only by: Marland Kitchen MyChart Message (if you have MyChart) OR . A paper copy in the mail If you have any lab test that is abnormal or we need to change your treatment, we will call you to review the results.   Testing/Procedures: Your physician has requested that you have a lexiscan myoview. For further information please visit [email or web address] Please follow instruction sheet BELOW:    You are scheduled for a Myocardial Perfusion Imaging Study    Please arrive 15 minutes prior to your appointment time for registration and insurance purposes.  The test will take approximately 3 to 4 hours to complete; you may bring reading material.  If someone comes with you to your appointment, they will need to remain in the main lobby due to limited space in the testing area. **If you are pregnant or breastfeeding, please notify the nuclear lab prior to your appointment**  How to prepare for your Myocardial Perfusion Test: . Do not eat or drink 3 hours prior to your test, except you may have water. . Do not consume products containing caffeine (regular or decaffeinated) 12 hours prior to your test. (ex: coffee, chocolate, sodas, tea). . Do bring a list of your current medications with you.  If not listed below, you may take your medications as normal.  HOLD METFORMIN UNTIL AFTER TEST . Do wear comfortable clothes (no dresses or overalls) and walking shoes, tennis shoes preferred (No heels or open toe shoes are allowed). . Do NOT wear cologne, perfume, aftershave, or lotions (deodorant is  allowed). . If these instructions are not followed, your test will have to be rescheduled.    Follow-Up: At [facility], you and your health needs are our priority.  As part of our continuing mission to provide you with exceptional heart care, we have created designated Provider Care Teams.  These Care Teams include your primary Cardiologist (physician) and Advanced Practice Providers (APPs -  Physician Assistants and Nurse Practitioners) who all work together to provide you with the care you need, when you need it.  We recommend signing up for the patient portal called "MyChart".  Sign up information is provided on this After Visit Summary.  MyChart is used to connect with patients for Virtual Visits (Telemedicine).  Patients are able to view lab/test results, encounter notes, upcoming appointments, etc.  Non-urgent messages can be sent to your provider as well.   To learn more about what you can do with MyChart, go to [email or web address].    Your next appointment:   12 month(s)  The format for your next appointment:   In Person  Provider:   You may see Verne Carrow, MD or one of the following Advanced Practice Providers on your designated Care Team:    Ronie Spies, PA-C  Jacolyn Reedy, PA-C    Other Instructions Please monitor your blood pressure occasionally at home. I would recommend using a blood pressure cuff that goes on your arm. The wrist ones can be inaccurate. If possible, try to select one that also reports your heart rate. To check your blood pressure,  choose a time at least 3 hours after taking your blood pressure medicines. If you can sample it at different times of the day, that's great - it might give you more information about how your blood pressure fluctuates. Remain seated in a chair for 5 minutes quietly beforehand, then check it. Call us if you tend to get readings of greater than 130 on the top number or 80 on the bottom number.

## 2019-11-07 ENCOUNTER — Telehealth: Payer: Self-pay | Admitting: *Deleted

## 2019-11-07 DIAGNOSIS — Z79899 Other long term (current) drug therapy: Secondary | ICD-10-CM

## 2019-11-07 DIAGNOSIS — E785 Hyperlipidemia, unspecified: Secondary | ICD-10-CM

## 2019-11-07 MED ORDER — EZETIMIBE 10 MG PO TABS: 10.0000 mg | ORAL_TABLET | Freq: Every day | ORAL | 3 refills | Status: AC

## 2019-11-07 NOTE — Telephone Encounter (Signed)
-----   Message from Laurann Montana, [place] sent at 11/06/2019  5:11 PM EDT ----- Please let pt know labs look good overall but notes on cholesterol. "Bad cholesterol" LDL is 86 which is not terrible but with patients with coronary disease we recommend a goal <70. At this point 2 options. A) if she feels there is room for achievable improvement in lifestyle, would work on diet/exercise and recheck liver/lipids in 4 months or B) if she feels she is doing as much as she can in this department, would add ezetimibe and recheck liver/lipids in 6-8 weeks.  HDL remains low which is the "good cholesterol" but actually improved from prior. The best way to increase this is exercise.  Dayna Dunn PA-C

## 2019-11-07 NOTE — Telephone Encounter (Signed)
Per pt via mychart. she will add Zetia 10 mg qd and repeat lipid/lft in 6-8 weeks.  Prescription sent to [facility] [address]

## 2019-11-08 ENCOUNTER — Telehealth: Payer: Self-pay

## 2019-11-08 NOTE — Telephone Encounter (Signed)
**Note De-Identified Velton Roselle Obfuscation** I started a Ezetimibe PA through covermymeds: Key: [id number]

## 2019-11-08 NOTE — Telephone Encounter (Signed)
**Note De-Identified Victorious Cosio Obfuscation** Following message was received from covermymeds: [name] Key: [id number] - PA Case ID: [id number] Outcome Approved today PA Case: [id number] Approved, Coverage Starts on: 11/08/2019, Coverage Ends on: 11/07/2020. Drug Ezetimibe 10MG  tablets Form PA Form [phone] NCPDP)  I have notified [facility] of this approval as well.

## 2019-11-19 ENCOUNTER — Other Ambulatory Visit (INDEPENDENT_AMBULATORY_CARE_PROVIDER_SITE_OTHER): Payer: Self-pay | Admitting: Primary Care

## 2019-11-21 DIAGNOSIS — J019 Acute sinusitis, unspecified: Secondary | ICD-10-CM | POA: Diagnosis not present

## 2019-12-03 DIAGNOSIS — E119 Type 2 diabetes mellitus without complications: Secondary | ICD-10-CM | POA: Diagnosis not present

## 2019-12-04 DIAGNOSIS — K529 Noninfective gastroenteritis and colitis, unspecified: Secondary | ICD-10-CM | POA: Diagnosis not present

## 2019-12-04 DIAGNOSIS — J3489 Other specified disorders of nose and nasal sinuses: Secondary | ICD-10-CM | POA: Diagnosis not present

## 2019-12-12 ENCOUNTER — Encounter: Payer: Self-pay | Admitting: Adult Health

## 2019-12-12 ENCOUNTER — Ambulatory Visit (INDEPENDENT_AMBULATORY_CARE_PROVIDER_SITE_OTHER): Payer: BC Managed Care – PPO | Admitting: Adult Health

## 2019-12-12 ENCOUNTER — Other Ambulatory Visit: Payer: Self-pay

## 2019-12-12 VITALS — BP 136/87 | HR 92 | Ht 67.0 in | Wt 246.0 lb

## 2019-12-12 DIAGNOSIS — Z3042 Encounter for surveillance of injectable contraceptive: Secondary | ICD-10-CM

## 2019-12-12 DIAGNOSIS — N921 Excessive and frequent menstruation with irregular cycle: Secondary | ICD-10-CM

## 2019-12-12 MED ORDER — MEDROXYPROGESTERONE ACETATE 150 MG/ML IM SUSP: 150.0000 mg | INTRAMUSCULAR | 1 refills | Status: DC

## 2019-12-12 NOTE — Progress Notes (Signed)
  Subjective:     Patient ID: Kristen Price, female   DOB: 02/28/1975, 44 y.o.   MRN: [id number]  HPI Kristen Price is a 44 year old white female,divorced, G1P1 in for complaints of bleeding with depo, she got her first depo 11/03/19 at [facility] and had period 11/13-27 then has spotted brown till 3 days ago. She works from home.  PCP is Dwyane Luo PA   Review of Systems Spotting with depo Not sexually active Denies any pain  Reviewed past medical,surgical, social and family history. Reviewed medications and allergies.     Objective:   Physical Exam BP 136/87 (BP Location: Left Arm, Patient Position: Sitting, Cuff Size: Normal)   Pulse 92   Ht 5\' 7"  (1.702 m)   Wt 246 lb (111.6 kg)   LMP 09/24/2019   BMI 38.53 kg/m  Skin warm and dry.Pelvic: external genitalia is normal in appearance no lesions, vagina: brown discharge withiout odor,no lesions,urethra has no lesions or masses noted, cervix:smooth and bulbous, uterus: normal size, shape and contour, non tender, no masses felt, adnexa: no masses or tenderness noted. Bladder is non tender and no masses felt.  AA is 3 PHQ 9 score is 17, no SI, GAD 7 score is 9    Upstream - 12/12/19 1627      Pregnancy Intention Screening   Does the patient want to become pregnant in the next year? No    Does the patient's partner want to become pregnant in the next year? No    Would the patient like to discuss contraceptive options today? Yes      Contraception Wrap Up   Current Method Hormonal Injection    End Method Hormonal Injection    Contraception Counseling Provided Yes         Examination chaperoned by 14/08/21 LPN    Assessment:     1. Irregular intermenstrual bleeding, resolved Discussed that usually stops by second injection  2. Encounter for surveillance of injectable contraceptive Meds ordered this encounter  Medications  . medroxyPROGESTERone (DEPO-PROVERA) 150 MG/ML injection    Sig: Inject 1 mL (150 mg total) into the muscle every  3 (three) months.    Dispense:  1 mL    Refill:  1    Order Specific Question:   Supervising Provider    Answer:   Marchelle Folks [[id number]]      Plan:     Return 01/28/20 for pap and physical and depo

## 2020-01-07 ENCOUNTER — Other Ambulatory Visit: Payer: BC Managed Care – PPO

## 2020-01-28 ENCOUNTER — Encounter: Payer: Self-pay | Admitting: Adult Health

## 2020-01-28 ENCOUNTER — Other Ambulatory Visit (HOSPITAL_COMMUNITY)
Admission: RE | Admit: 2020-01-28 | Discharge: 2020-01-28 | Disposition: A | Discharge status: Home / Self Care | Payer: Managed Care, Other (non HMO) | Source: Ambulatory Visit | Attending: Adult Health | Admitting: Adult Health

## 2020-01-28 ENCOUNTER — Other Ambulatory Visit: Payer: Self-pay

## 2020-01-28 ENCOUNTER — Ambulatory Visit (INDEPENDENT_AMBULATORY_CARE_PROVIDER_SITE_OTHER): Payer: Managed Care, Other (non HMO) | Admitting: Adult Health

## 2020-01-28 VITALS — BP 146/92 | HR 103 | Ht 66.0 in | Wt 251.0 lb

## 2020-01-28 DIAGNOSIS — Z308 Encounter for other contraceptive management: Secondary | ICD-10-CM | POA: Diagnosis not present

## 2020-01-28 DIAGNOSIS — F410 Panic disorder [episodic paroxysmal anxiety] without agoraphobia: Secondary | ICD-10-CM

## 2020-01-28 DIAGNOSIS — N921 Excessive and frequent menstruation with irregular cycle: Secondary | ICD-10-CM

## 2020-01-28 DIAGNOSIS — Z3042 Encounter for surveillance of injectable contraceptive: Secondary | ICD-10-CM | POA: Diagnosis not present

## 2020-01-28 DIAGNOSIS — Z01419 Encounter for gynecological examination (general) (routine) without abnormal findings: Secondary | ICD-10-CM | POA: Diagnosis present

## 2020-01-28 DIAGNOSIS — K429 Umbilical hernia without obstruction or gangrene: Secondary | ICD-10-CM

## 2020-01-28 DIAGNOSIS — R030 Elevated blood-pressure reading, without diagnosis of hypertension: Secondary | ICD-10-CM

## 2020-01-28 HISTORY — DX: Encounter for gynecological examination (general) (routine) without abnormal findings: Z01.419

## 2020-01-28 MED ORDER — HYDROXYZINE HCL 10 MG PO TABS: 10.0000 mg | ORAL_TABLET | Freq: Three times a day (TID) | ORAL | 3 refills | Status: AC | PRN

## 2020-01-28 MED ORDER — MEDROXYPROGESTERONE ACETATE 150 MG/ML IM SUSP
150.0000 mg | Freq: Once | INTRAMUSCULAR | Status: AC
  Administered 2020-01-28: 150 mg via INTRAMUSCULAR

## 2020-01-28 MED ORDER — MEDROXYPROGESTERONE ACETATE 150 MG/ML IM SUSP: 150.0000 mg | INTRAMUSCULAR | 4 refills | Status: DC

## 2020-01-28 NOTE — Progress Notes (Signed)
Patient ID: Kristen Price, female   DOB: 1975/10/08, 45 y.o.   MRN: [id number] History of Present Illness: Kristen Price is a 45 year old white female,divorced, G1P1 in for well woman gyn exam and depo. PCP is Dwyane Luo PA    Current Medications, Allergies, Past Medical History, Past Surgical History, Family History and Social History were reviewed in [employer] record.     Review of Systems: Patient denies any headaches, hearing loss, fatigue, blurred vision, shortness of breath, chest pain, abdominal pain, problems with bowel movements, urination, or intercourse. No joint pain or mood swings. Has some brown bleeding with depo Has panic attacks, requests xanax, told to call PCP   Physical Exam:BP (!) 146/92 (BP Location: Left Arm, Patient Position: Sitting, Cuff Size: Large)   Pulse (!) 103   Ht 5\' 6"  (1.676 m)   Wt 251 lb (113.9 kg)   BMI 40.51 kg/m  General:  Well developed, well nourished, no acute distress Skin:  Warm and dry Neck:  Midline trachea, normal thyroid, good ROM, no lymphadenopathy Lungs; Clear to auscultation bilaterally Breast:  No dominant palpable mass, retraction, or nipple discharge Cardiovascular: Regular rate and rhythm Abdomen:  Soft, non tender, no hepatosplenomegaly,has small umbilical hernia  Pelvic:  External genitalia is normal in appearance, no lesions.  The vagina is normal in appearance,has brown discharge. Urethra has no lesions or masses. The cervix is bulbous. Pap with GC/CHL and HRHPV genotyping performed. Uterus is felt to be normal size, shape, and contour.  No adnexal masses or tenderness noted.Bladder is non tender, no masses felt. Rectal: pt refused, will send 3 hemoccult cards home with her Extremities/musculoskeletal:  No swelling or varicosities noted, no clubbing or cyanosis Psych:  No mood changes, alert and cooperative,seems happy Fall risk is low Pt refused PHQ 9   Upstream - 01/28/20 1540      Pregnancy Intention  Screening   Does the patient want to become pregnant in the next year? No    Does the patient's partner want to become pregnant in the next year? No    Would the patient like to discuss contraceptive options today? No      Contraception Wrap Up   Current Method Hormonal Injection    End Method Hormonal Injection    Contraception Counseling Provided No         Examination chaperoned by 01/30/20 LPN.   Impression and Plan: 1. Encounter for gynecological examination with Papanicolaou smear of cervix Pap sent Physical in 1 year Pap in 3 if normal Mammogram advised Sent 3 Hemoccult cards home to do and return   2. Encounter for other contraceptive management She got depo in office today   3. Encounter for surveillance of injectable contraceptive Will refill depo  4. Irregular intermenstrual bleeding   5. Panic attacks Will try vistaril But call PCP if wants xanax  6. Umbilical hernia without obstruction and without gangrene If ever has pain or can not push in go to ER   7. Elevated BP without diagnosis of hypertension Keep check on BP she said it was due to having to undress

## 2020-01-31 LAB — CYTOLOGY - PAP
Chlamydia: NEGATIVE
Comment: NEGATIVE
Comment: NEGATIVE
Comment: NORMAL
Diagnosis: NEGATIVE
High risk HPV: NEGATIVE
Neisseria Gonorrhea: NEGATIVE

## 2020-02-08 ENCOUNTER — Encounter: Payer: Self-pay | Admitting: Adult Health

## 2020-02-28 ENCOUNTER — Telehealth: Payer: Self-pay | Admitting: Physician Assistant

## 2020-02-28 NOTE — Telephone Encounter (Signed)
I received the following message in Epic: "[employer] works with your patients' plan sponsors to provide you with the enclosed [employer] safety and health considerations for patients in your practice. Please review the health information provided and make any changes in therapy that you believe are appropriate. [employer] understands that the information may not be applicable to every patient's therapy and therefore presents it as informational only.  Adverse Drug Interaction: ERYTHROMYCIN and ATORVASTATIN CALCIUM Our claims record suggests that your patient is receiving ERYTHROMYCIN and ATORVASTATIN CALCIUM. Concomitant use of atorvastatin with moderate to strong cytochrome P450 3A4 inhibitors such as erythromycin or clarithromycin is associated with an increased risk of skeletal muscle effects, including myopathy and rhabdomyolysis. Caution is advised with atorvastatin doses exceeding 20 mg. In a population-based cohort study of patients 65 years and older, concomitant use of atorvastatin with erythromycin or clarithromycin was associated with a higher risk for hospitalization with rhabdomyolysis or acute kidney injury compared with azithromycin. Please weigh the potential risks versus benefits for your patient and consider a dose adjustment if appropriate."   We do not have erythromycin (an antibiotic) on her medicine list. Please call patient to let her know of this drug interaction and ask her to contact the prescriber of her erythromycin to let them know of this interaction if they may want to consider alternatives since she is on atorvastatin for her chronic coronary artery disease. Thx.

## 2020-02-28 NOTE — Telephone Encounter (Signed)
Called patient and reviewed advice from Ronie Spies, [place]. Patient states she is taking erythromycin in preparation for teeth extraction. She states she has noticed increased muscle cramps especially in her legs. She states she will call the prescriber to ask if there is a different antibiotic and understands that she should not stop atorvastatin. She was grateful for the call.

## 2020-03-21 ENCOUNTER — Telehealth: Payer: Self-pay | Admitting: Adult Health

## 2020-04-22 ENCOUNTER — Ambulatory Visit (INDEPENDENT_AMBULATORY_CARE_PROVIDER_SITE_OTHER): Payer: Managed Care, Other (non HMO) | Admitting: *Deleted

## 2020-04-22 ENCOUNTER — Other Ambulatory Visit: Payer: Self-pay

## 2020-04-22 DIAGNOSIS — Z308 Encounter for other contraceptive management: Secondary | ICD-10-CM

## 2020-04-22 MED ORDER — MEDROXYPROGESTERONE ACETATE 150 MG/ML IM SUSP
150.0000 mg | Freq: Once | INTRAMUSCULAR | Status: AC
  Administered 2020-04-22: 150 mg via INTRAMUSCULAR

## 2020-04-22 NOTE — Progress Notes (Signed)
   NURSE VISIT- INJECTION  SUBJECTIVE:  Kristen Price is a 45 y.o. G53P1000 female here for a Depo Provera for contraception/period management. She is a GYN patient.   OBJECTIVE:  There were no vitals taken for this visit.  Appears well, in no apparent distress  Injection administered in: Right deltoid  Meds ordered this encounter  Medications  . medroxyPROGESTERone (DEPO-PROVERA) injection 150 mg    ASSESSMENT: GYN patient Depo Provera for contraception/period management PLAN: Follow-up: in 11-13 weeks for next Depo   Malachy Mood  04/22/2020 4:07 PM

## 2020-05-15 ENCOUNTER — Other Ambulatory Visit (INDEPENDENT_AMBULATORY_CARE_PROVIDER_SITE_OTHER): Payer: Self-pay | Admitting: Primary Care

## 2020-05-15 NOTE — Telephone Encounter (Signed)
Requested medications are due for refill today.  yes  Requested medications are on the active medications list.  yes  Last refill. 09/21/2019  Future visit scheduled.   No  Notes to clinic.  Pt has not been seen for over 1 year.  More than 3 months overdue for office visit.

## 2020-05-20 ENCOUNTER — Other Ambulatory Visit (INDEPENDENT_AMBULATORY_CARE_PROVIDER_SITE_OTHER): Payer: Self-pay | Admitting: Primary Care

## 2020-05-27 ENCOUNTER — Other Ambulatory Visit (HOSPITAL_COMMUNITY): Payer: Self-pay | Admitting: Internal Medicine

## 2020-05-27 DIAGNOSIS — R059 Cough, unspecified: Secondary | ICD-10-CM

## 2020-05-30 ENCOUNTER — Other Ambulatory Visit (INDEPENDENT_AMBULATORY_CARE_PROVIDER_SITE_OTHER): Payer: Self-pay | Admitting: Primary Care

## 2020-06-13 NOTE — Telephone Encounter (Signed)
Spoke w patient.  She clarified that since she had covid last year her blood sugars have been higher/harder to control.  She now has been referred to endocrinology, but the appointment is not until late August.    She is currently on metformin 1000 mg BID w extra 500 mg daily, glipizide 10 mg BID and so her PCP wants to add acarbose to try to help until she gets to endocrinology.   Even though this may not be most ideal combination, she wants to know if she is safe to take it as far as her heart.  What she read about it made her concerned and she wanted to ask before starting it.  Pt again aware I will review with our PharmD and let her know if acarbose is safe to use.

## 2020-06-13 NOTE — Telephone Encounter (Deleted)
metformin

## 2020-06-26 ENCOUNTER — Other Ambulatory Visit: Payer: Self-pay

## 2020-06-26 ENCOUNTER — Encounter: Payer: Self-pay | Admitting: Internal Medicine

## 2020-06-26 ENCOUNTER — Ambulatory Visit (INDEPENDENT_AMBULATORY_CARE_PROVIDER_SITE_OTHER): Payer: Managed Care, Other (non HMO) | Admitting: Internal Medicine

## 2020-06-26 ENCOUNTER — Other Ambulatory Visit (INDEPENDENT_AMBULATORY_CARE_PROVIDER_SITE_OTHER): Payer: Self-pay | Admitting: Primary Care

## 2020-06-26 VITALS — BP 122/88 | HR 94 | Ht 67.0 in | Wt 255.0 lb

## 2020-06-26 DIAGNOSIS — E1159 Type 2 diabetes mellitus with other circulatory complications: Secondary | ICD-10-CM | POA: Diagnosis not present

## 2020-06-26 IMAGING — CR DG SHOULDER 2+V PORT*R*
1 series · 2 of 2 positions shown · non-contrast
Comparison: None.

CLINICAL DATA: Fall, pain

EXAM:
PORTABLE RIGHT SHOULDER

[Series 1: ap · 0.17mm/px · 2 of 2 slices shown]
[im 1/2]
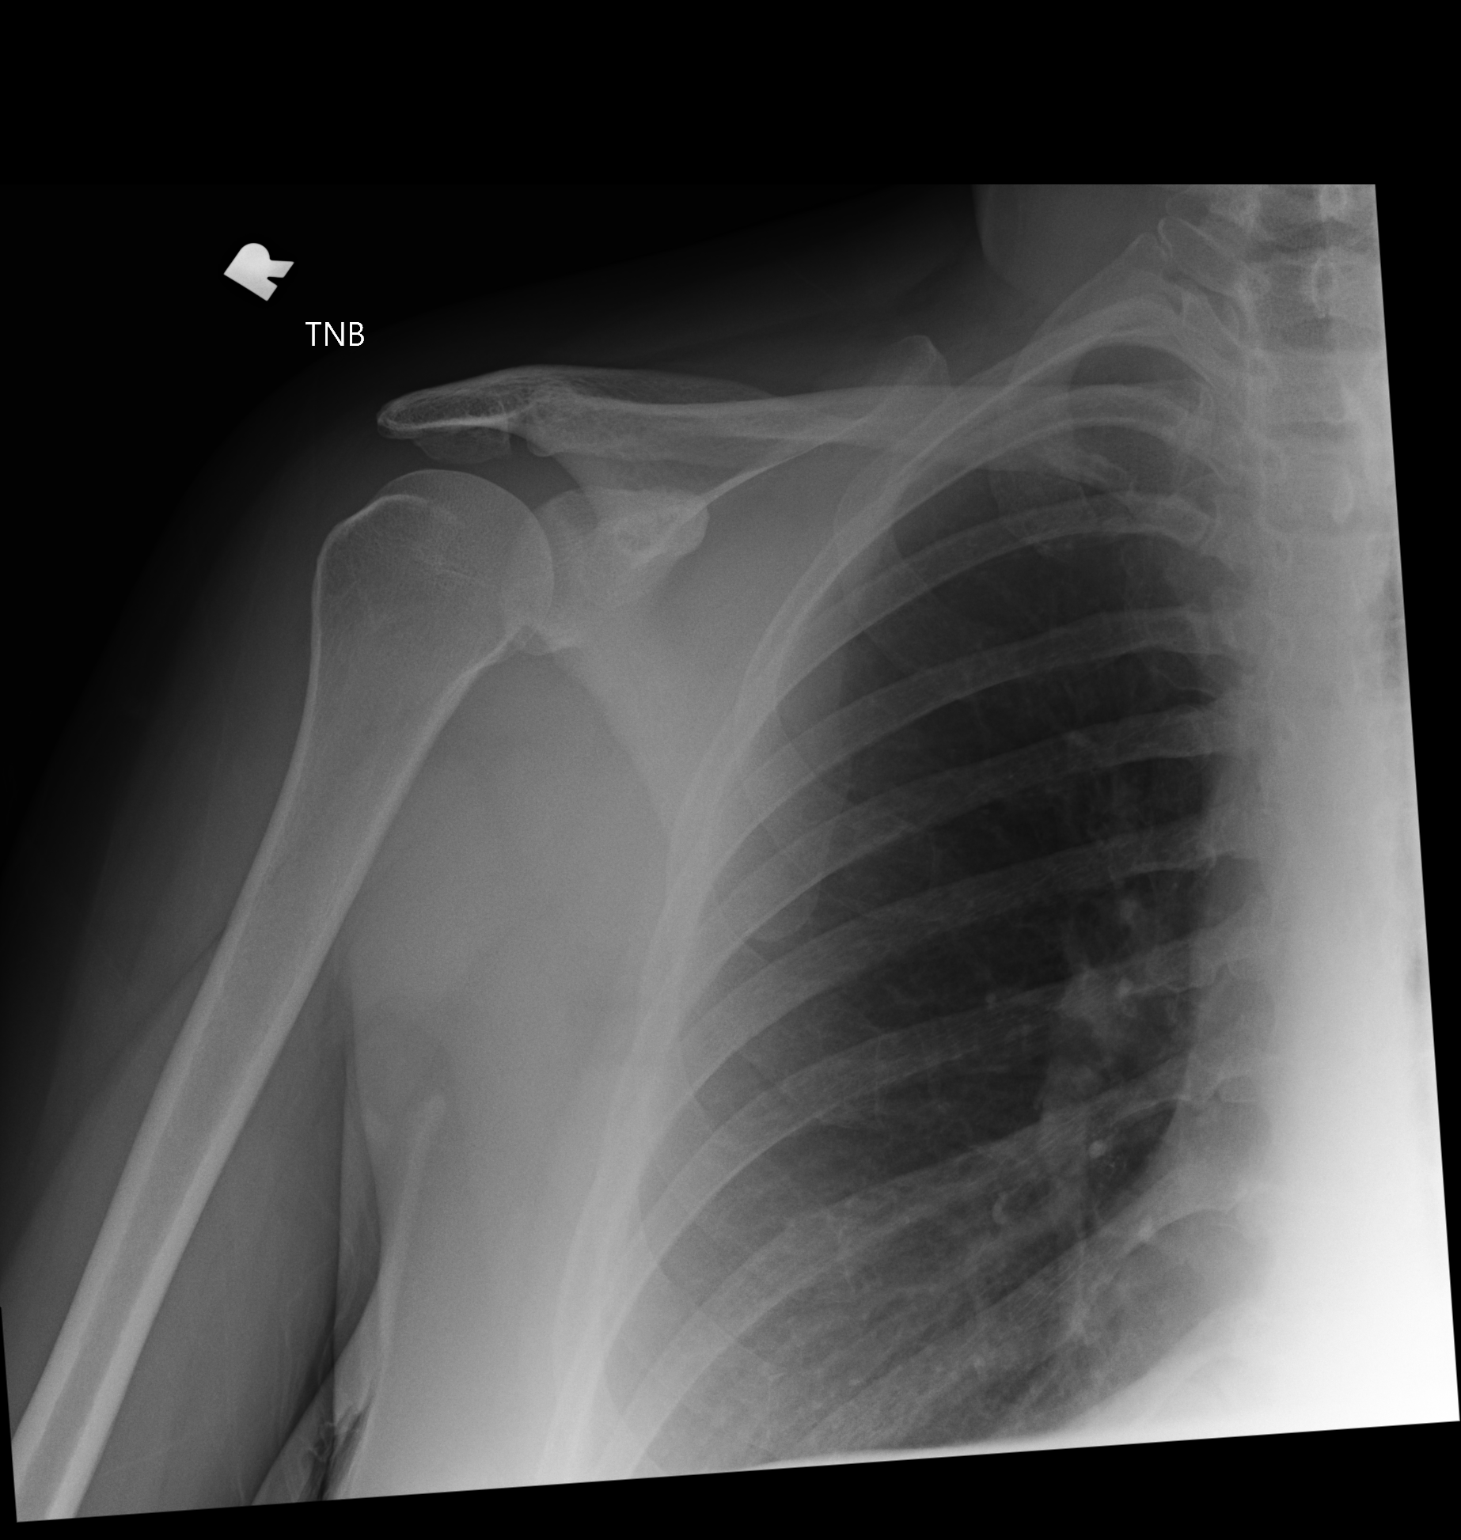
[im 2/2]
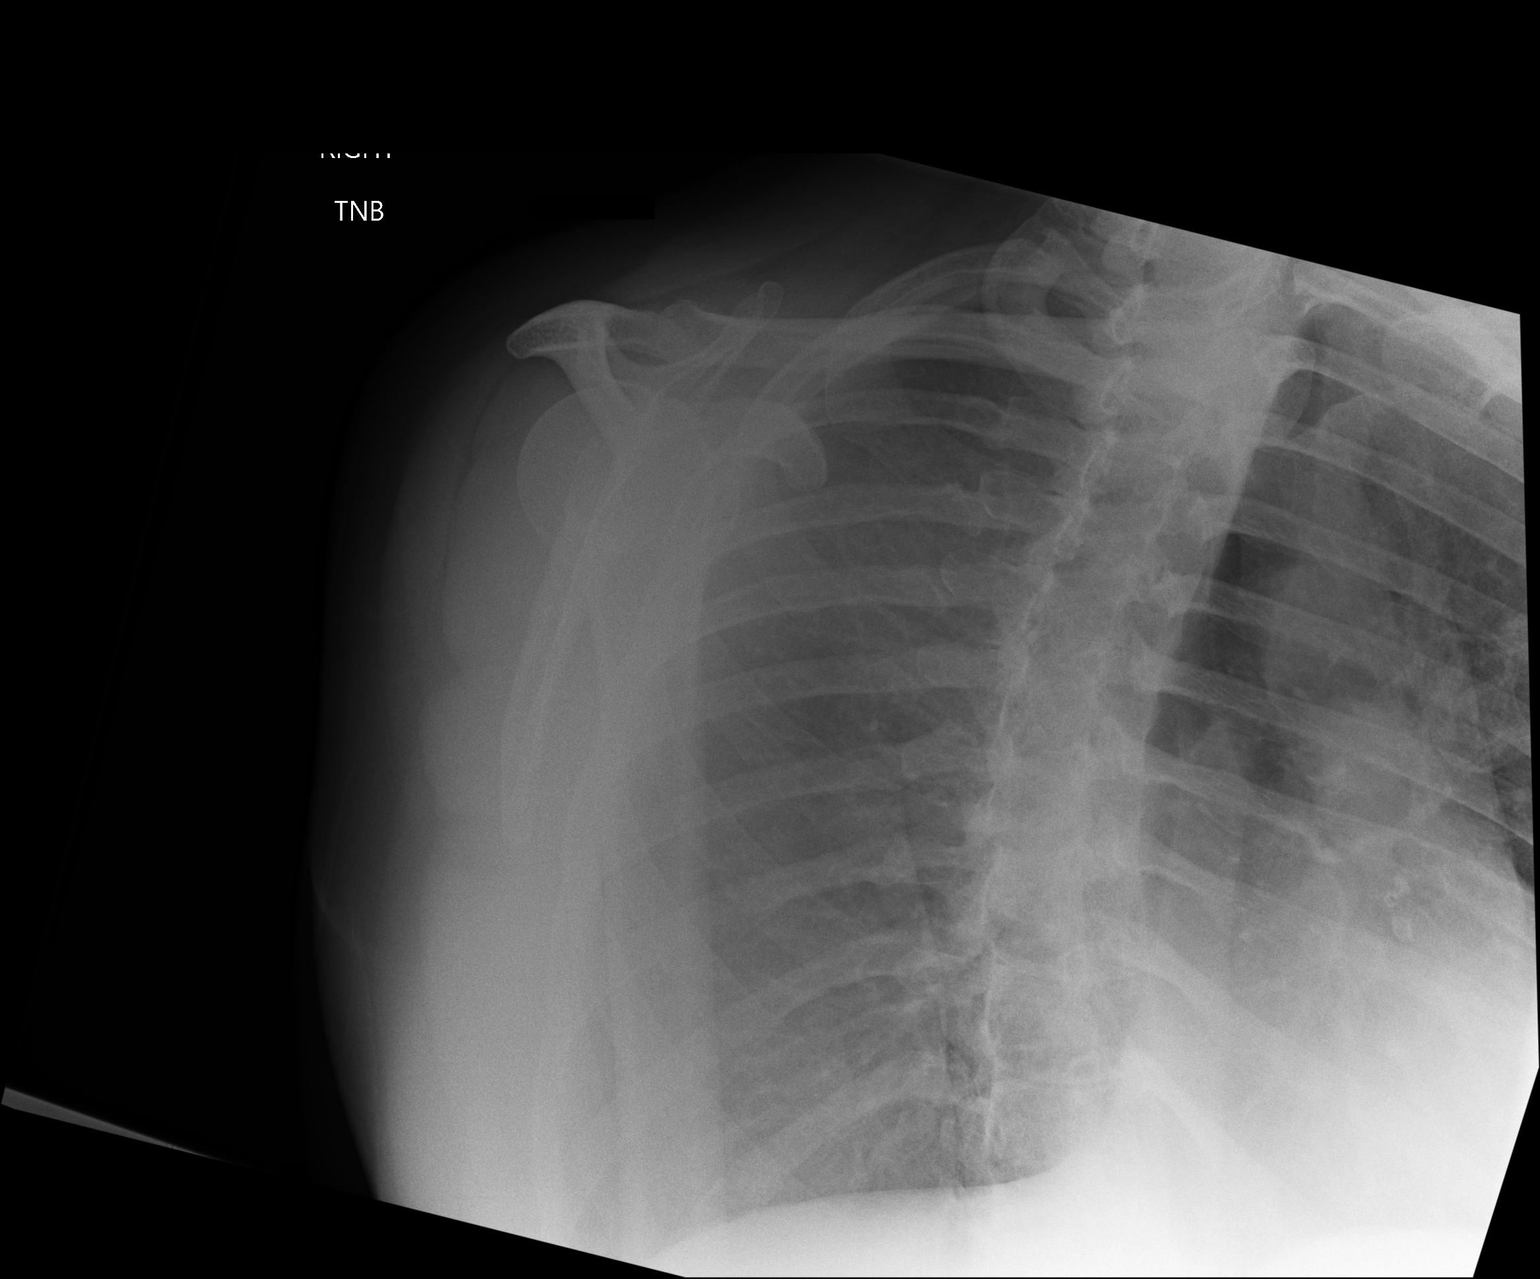

[2 of 2 positions shown; findings below may reference images not displayed]

FINDINGS: There is no evidence of fracture or dislocation. There is no
evidence of arthropathy or other focal bone abnormality. Soft
tissues are unremarkable.
IMPRESSION: No fracture or dislocation of the right shoulder. Joint spaces are
well preserved. Partially imaged right chest is unremarkable.

## 2020-06-26 MED ORDER — EMPAGLIFLOZIN 10 MG PO TABS: 10.0000 mg | ORAL_TABLET | Freq: Every day | ORAL | 6 refills | Status: AC

## 2020-06-26 MED ORDER — FREESTYLE LIBRE 2 SENSOR MISC: 1.0000 | 11 refills | Status: AC

## 2020-06-26 NOTE — Progress Notes (Signed)
Name: Lorelai Huyser  MRN/ DOB: [id number], 1975/12/26   Age/ Sex: 45 y.o., female    PCP: Samuella Bruin   Reason for Endocrinology Evaluation: Type 2 Diabetes Mellitus     Date of Initial Endocrinology Visit: 06/26/2020     PATIENT IDENTIFIER: Ms. Tesneem Dufrane is a 45 y.o. female with a past medical history of CAD. The patient presented for initial endocrinology clinic visit on 06/26/2020 for consultative assistance with her diabetes management.    HPI: Ms. Peery was    Diagnosed with DM in 2019 with an A1c of 8.0 % She was started on Metformin at the time . In 09/2019 she developed severe hyperglycemia up to 511 mg/dL while having COVID. Two to 3 months ago Glipiaide was added due to fasting hyperglycemia > 200 mg/dL but she stopped it until her visit with me and after the cardiologist recommended " heart healthy meds" Prior Medications tried/Intolerance: Glipizide - no intolerance , was on Jardiance- lost insurance and became cost prohibitive  Currently checking blood sugars 0 x / day,   Hypoglycemia episodes : no                Hemoglobin A1c has ranged from 5.3% in 2022, peaking at 8.0% in 2019. Patient required assistance for hypoglycemia: no Patient has required hospitalization within the last 1 year from hyper or hypoglycemia: yes   In terms of diet, the patient eats 2 meals, snacks occasionally on low fat options    HOME DIABETES REGIMEN: Metformin 1000 mg BID     Statin: yes ACE-I/ARB: no Prior Diabetic Education: yes   METER DOWNLOAD SUMMARY: Did not bring    DIABETIC COMPLICATIONS: Microvascular complications:   Denies: neuropathy, CKD, retinopathy Last eye exam: Completed 2021  Macrovascular complications:  CAD ( S/P stent) Denies: PVD, CVA   PAST HISTORY: Past Medical History:  Past Medical History:  Diagnosis Date   Anxiety    Asthma    CAD (coronary artery disease)    a. CAD/NSTEMI 2016 status post drug-eluting stent to be  proximal circumfle.   Chronic lower back pain    Depression    Diabetes (HCC) 2019   Family history of adverse reaction to anesthesia    "mom didn't want to wake up at all"   GERD (gastroesophageal reflux disease)    Hiatal hernia    HLD (hyperlipidemia)    Hypertension    Obesity    Past Surgical History:  Past Surgical History:  Procedure Laterality Date   CARDIAC CATHETERIZATION N/A 06/07/2014   Procedure: Left Heart Cath and Coronary Angiography;  Surgeon: Lennette Bihari, MD;  Location: [facility];  Service: Cardiovascular;  Laterality: N/A;   CHOLECYSTECTOMY  2019   FEMUR FRACTURE SURGERY Left 2003   "metal rod runs from hip to knee"   FRACTURE SURGERY     HOODED EYE SURGERY      Social History:  reports that she has quit smoking. Her smoking use included cigarettes. She has a 45.00 pack-year smoking history. She has never used smokeless tobacco. She reports current alcohol use. She reports that she does not use drugs. Family History:  Family History  Problem Relation Age of Onset   Coronary artery disease Mother 12       MI   Heart attack Mother    Hypertension Mother    Diabetes Mother    Asthma Mother    Coronary artery disease Father 43  MI   Heart attack Father    Hypertension Father    Stroke Neg Hx      HOME MEDICATIONS: Allergies as of 06/26/2020       Reactions   Amoxicillin Nausea And Vomiting   Did it involve swelling of the face/tongue/throat, SOB, or low BP? No Did it involve sudden or severe rash/hives, skin peeling, or any reaction on the inside of your mouth or nose? No Did you need to seek medical attention at a hospital or doctor's office? No When did it last happen?       If all above answers are "NO", may proceed with cephalosporin use.   Augmentin [amoxicillin-pot Clavulanate] Nausea And Vomiting        Medication List        Accurate as of June 26, 2020  8:25 AM. If you have any questions, ask your nurse or doctor.           STOP taking these medications    glipiZIDE 10 MG tablet Commonly known as: GLUCOTROL Stopped by: Scarlette Shorts, MD       TAKE these medications    albuterol 108 (90 Base) MCG/ACT inhaler Commonly known as: VENTOLIN HFA Inhale 1-2 puffs into the lungs every 6 (six) hours as needed for wheezing or shortness of breath.   aspirin 81 MG EC tablet Take 1 tablet (81 mg total) by mouth daily.   atorvastatin 80 MG tablet Commonly known as: LIPITOR Take 1 tablet (80 mg total) by mouth daily.   budesonide-formoterol 160-4.5 MCG/ACT inhaler Commonly known as: SYMBICORT Inhale 2 puffs into the lungs 2 (two) times daily.   Butalbital-APAP-Caffeine 50-300-40 MG Caps Take 1 capsule by mouth every 4 (four) hours as needed.   calcium-vitamin D 250-100 MG-UNIT tablet Take 1 tablet by mouth 2 (two) times daily.   cetirizine 10 MG tablet Commonly known as: ZyrTEC Allergy Take 1 tablet (10 mg total) by mouth daily.   Clever Choice Nebulizer Misc 1 each by Does not apply route as needed (Use nebulizer as needed for wheezing and cough).   ezetimibe 10 MG tablet Commonly known as: ZETIA Take 1 tablet (10 mg total) by mouth daily.   fluticasone 50 MCG/ACT nasal spray Commonly known as: FLONASE Place 1 spray into both nostrils daily for 14 days.   hydrOXYzine 10 MG tablet Commonly known as: ATARAX/VISTARIL Take 1 tablet (10 mg total) by mouth 3 (three) times daily as needed.   ipratropium-albuterol 0.5-2.5 (3) MG/3ML Soln Commonly known as: DUONEB Take 3 mLs by nebulization every 4 (four) hours as needed.   medroxyPROGESTERone 150 MG/ML injection Commonly known as: DEPO-PROVERA Inject 1 mL (150 mg total) into the muscle every 3 (three) months.   meloxicam 7.5 MG tablet Commonly known as: Mobic Take 1 tablet (7.5 mg total) by mouth daily.   metFORMIN 500 MG tablet Commonly known as: GLUCOPHAGE Take 1,000 mg by mouth 2 (two) times daily with a meal.   metFORMIN  1000 MG tablet Commonly known as: GLUCOPHAGE SMARTSIG:1 Tablet(s) By Mouth Morning-Evening   methylPREDNISolone 4 MG Tbpk tablet Commonly known as: MEDROL DOSEPAK TAKE 6 TABLETS ON DAY 1 AS DIRECTED ON PACKAGE AND DECREASE BY 1 TAB EACH DAY FOR A TOTAL OF 6 DAYS   montelukast 10 MG tablet Commonly known as: SINGULAIR Take 1 tablet (10 mg total) by mouth at bedtime. Patient needs office visit   nitroGLYCERIN 0.4 MG SL tablet Commonly known as: NITROSTAT Place 1 tablet (0.4 mg total)  under the tongue every 5 (five) minutes x 3 doses as needed for chest pain.   omeprazole 20 MG capsule Commonly known as: PRILOSEC Take 1 capsule (20 mg total) by mouth in the morning and at bedtime.   vitamin C 1000 MG tablet Take 1,000 mg by mouth in the morning and at bedtime.   Zinc 50 MG Tabs Take 50 mg by mouth daily.         ALLERGIES: Allergies  Allergen Reactions   Amoxicillin Nausea And Vomiting    Did it involve swelling of the face/tongue/throat, SOB, or low BP? No Did it involve sudden or severe rash/hives, skin peeling, or any reaction on the inside of your mouth or nose? No Did you need to seek medical attention at a hospital or doctor's office? No When did it last happen?       If all above answers are "NO", may proceed with cephalosporin use.    Augmentin [Amoxicillin-Pot Clavulanate] Nausea And Vomiting     REVIEW OF SYSTEMS: A comprehensive ROS was conducted with the patient and is negative except as per HPI and below:  Review of Systems  Eyes:  Positive for blurred vision.  Gastrointestinal:  Negative for nausea and vomiting.  Genitourinary:  Negative for frequency.  Endo/Heme/Allergies:  Negative for polydipsia.     OBJECTIVE:   VITAL SIGNS: BP 122/88   Pulse 94   Ht 5\' 7"  (1.702 m)   Wt 255 lb (115.7 kg)   SpO2 96%   BMI 39.94 kg/m    PHYSICAL EXAM:  General: Pt appears well and is in NAD  Neck: General: Supple without adenopathy or carotid  bruits. Thyroid: Thyroid size normal.  No goiter or nodules appreciated  Lungs: Clear with good BS bilat with no rales, rhonchi, or wheezes  Heart: RRR with normal S1 and S2 and no gallops; no murmurs; no rub  Abdomen: Normoactive bowel sounds, soft, nontender, without masses or organomegaly palpable  Extremities:  Lower extremities - No pretibial edema. No lesions.  Skin: Normal texture and temperature to palpation. No rash noted. No Acanthosis nigricans/skin tags. No lipohypertrophy.  Neuro: MS is good with appropriate affect, pt is alert and Ox3    DM foot exam: 06/26/2020  The skin of the feet is intact without sores or ulcerations. The pedal pulses are 2+ on right and 2+ on left. The sensation is intact to a screening 5.07, 10 gram monofilament bilaterally   DATA REVIEWED:  Lab Results  Component Value Date   HGBA1C 4.4 (L) 06/07/2014   Lab Results  Component Value Date   LDLCALC 86 11/06/2019   CREATININE 0.73 10/24/2019   No results found for: [facility]  Lab Results  Component Value Date   CHOL 146 11/06/2019   HDL 38 (L) 11/06/2019   LDLCALC 86 11/06/2019   TRIG 124 11/06/2019   CHOLHDL 3.8 11/06/2019        ASSESSMENT / PLAN / RECOMMENDATIONS:   1) Type 2 Diabetes Mellitus, Optimally controlled, With Macrovascular  complications - Most recent A1c of 5.3 %. Goal A1c < 7.0 %.    Plan: GENERAL: I have discussed with the patient the pathophysiology of diabetes. We went over the natural progression of the disease. We talked about both insulin resistance and insulin deficiency. We stressed the importance of lifestyle changes including diet and exercise. I explained the complications associated with diabetes including retinopathy, nephropathy, neuropathy as well as increased risk of cardiovascular disease. We went over the benefit seen  with glycemic control.   I explained to the patient that diabetic patients are at higher than normal risk for amputations.  Her  A1c is discordant with her BG readings, granded she does not check regularly, this is more of a sporadic thing but if her hyperglycemia is consistent, this would be discordant with her A1 5.3 %. She denies any hx of anemia, blood transfusion or thalassemia, will monitor this issue and maybe consider fructosamine in the future - Discussed add- on therapy with SGLT-2 inhibitors, cautioned against genital infections      MEDICATIONS: Continue Metformin 1000 mg BID  Start Jardiance 10 mg daily   EDUCATION / INSTRUCTIONS: BG monitoring instructions: Patient is instructed to check her blood sugars 1 times a day, fasting . Call [facility] if: BG persistently < 70  I reviewed the Rule of 15 for the treatment of hypoglycemia in detail with the patient. Literature supplied.   2) Diabetic complications:  Eye: Does not have known diabetic retinopathy.  Neuro/ Feet: Does not have known diabetic peripheral neuropathy. Renal: Patient does not have known baseline CKD. She is  not on an ACEI/ARB at present.  3) CAD/Dyslipidemia : Patient is on Atorvastatin 80 mg daily. She is under the care of cardiology    I spent 45 minutes preparing to see the patient by review of recent labs, imaging and procedures, obtaining and reviewing separately obtained history, communicating with the patient, ordering medications, and documenting clinical information in the EHR including the differential Dx, treatment, and any further evaluation and other management   F/U in 3 months   Signed electronically by: Lyndle HerrlichAbby Jaralla Elaynah Virginia, MD  [facility]  [facility] [address] [place], [place] [address] Phone: [phone] FAX: [phone]   CC: Shawnie DapperMann, Benjamin L, PA-C [address] [place] [address] Phone: [phone]  Fax: [phone]    Return to Endocrinology clinic as below: Future Appointments  Date Time Provider Department Center   07/15/2020  4:10 PM [facility] NURSE [facility]

## 2020-06-26 NOTE — Telephone Encounter (Signed)
  Notes to clinic: review for refill Looks like patient has new pcp listed    Requested Prescriptions  Pending Prescriptions Disp Refills   albuterol (VENTOLIN HFA) 108 (90 Base) MCG/ACT inhaler [Pharmacy Med Name: ALBUTEROL HFA (PROAIR) INHALER] 18 each     Sig: Inhale 1-2 puffs into the lungs every 6 (six) hours as needed for wheezing or shortness of breath.      Pulmonology:  Beta Agonists Failed - 06/26/2020 11:00 AM      Failed - One inhaler should last at least one month. If the patient is requesting refills earlier, contact the patient to check for uncontrolled symptoms.      Failed - Valid encounter within last 12 months    Recent Outpatient Visits           1 year ago Encounter to establish care   [facility] Grayce Sessions, NP   3 years ago Shortness of breath   Primary [facility] at [facility], [place], [place]   3 years ago Influenza A   Primary Care at [facility], [place], [place]   3 years ago Mild intermittent asthma without complication   Primary Care at [facility], Meda Coffee, MD

## 2020-06-26 NOTE — Patient Instructions (Addendum)
-   Continue Metformin 1000 mg Twice daily  - Start Jardiance 10 mg , 1 tablet daily in the morning     HOW TO TREAT LOW BLOOD SUGARS (Blood sugar LESS THAN 70 MG/DL) Please follow the RULE OF 15 for the treatment of hypoglycemia treatment (when your (blood sugars are less than 70 mg/dL)   STEP 1: Take 15 grams of carbohydrates when your blood sugar is low, which includes:  3-4 GLUCOSE TABS  OR 3-4 OZ OF JUICE OR REGULAR SODA OR ONE TUBE OF GLUCOSE GEL    STEP 2: RECHECK blood sugar in 15 MINUTES STEP 3: If your blood sugar is still low at the 15 minute recheck --> then, go back to STEP 1 and treat AGAIN with another 15 grams of carbohydrates.

## 2020-06-27 ENCOUNTER — Telehealth: Payer: Self-pay | Admitting: Internal Medicine

## 2020-06-27 NOTE — Telephone Encounter (Signed)
Patient was seen yesterday and states we only called in the [facility] 2 sensors but she needs the actual device and everything else that goes with it.   PHARMACY -   [id number] [place], [place] - [address]  [address] [place], [place] [address]

## 2020-06-30 ENCOUNTER — Other Ambulatory Visit: Payer: Self-pay | Admitting: *Deleted

## 2020-06-30 DIAGNOSIS — E1159 Type 2 diabetes mellitus with other circulatory complications: Secondary | ICD-10-CM

## 2020-06-30 MED ORDER — FREESTYLE LIBRE 14 DAY READER DEVI: 1.0000 | Freq: Once | 0 refills | Status: DC

## 2020-06-30 MED ORDER — FREESTYLE LIBRE 2 READER DEVI: 0 refills | Status: DC

## 2020-06-30 NOTE — Telephone Encounter (Signed)
Patient calling again to follow up on call from last week about having the [facility] 2 meter called in. Please advise.

## 2020-06-30 NOTE — Telephone Encounter (Signed)
[facility] freestyle libre [facility]

## 2020-07-15 ENCOUNTER — Ambulatory Visit: Payer: Managed Care, Other (non HMO)

## 2020-07-21 ENCOUNTER — Other Ambulatory Visit: Payer: Self-pay

## 2020-07-21 ENCOUNTER — Ambulatory Visit (INDEPENDENT_AMBULATORY_CARE_PROVIDER_SITE_OTHER): Payer: Managed Care, Other (non HMO) | Admitting: *Deleted

## 2020-07-21 DIAGNOSIS — Z3042 Encounter for surveillance of injectable contraceptive: Secondary | ICD-10-CM

## 2020-07-21 MED ORDER — MEDROXYPROGESTERONE ACETATE 150 MG/ML IM SUSP
150.0000 mg | Freq: Once | INTRAMUSCULAR | Status: AC
  Administered 2020-07-21: 150 mg via INTRAMUSCULAR

## 2020-07-21 NOTE — Progress Notes (Signed)
   NURSE VISIT- INJECTION  SUBJECTIVE:  Rosana Chellie Vanlue is a 45 y.o. G79P1000 female here for a Depo Provera for contraception/period management. She is a GYN patient.   OBJECTIVE:  There were no vitals taken for this visit.  Appears well, in no apparent distress  Injection administered in: Right deltoid per patient request  Meds ordered this encounter  Medications   medroxyPROGESTERone (DEPO-PROVERA) injection 150 mg    ASSESSMENT: GYN patient Depo Provera for contraception/period management PLAN: Follow-up: Patient states she is moving this Friday to [place] and will not need f/u appt  Jobe Marker  07/21/2020 4:18 PM

## 2020-08-09 ENCOUNTER — Other Ambulatory Visit: Payer: Self-pay | Admitting: Internal Medicine

## 2020-08-09 DIAGNOSIS — E1159 Type 2 diabetes mellitus with other circulatory complications: Secondary | ICD-10-CM

## 2020-08-29 ENCOUNTER — Ambulatory Visit: Payer: Managed Care, Other (non HMO) | Admitting: Internal Medicine

## 2020-09-28 IMAGING — DX DG CHEST 1V PORT
1 series · 1 of 1 positions shown · non-contrast
Comparison: 09/12/2018

CLINICAL DATA: Cough.  Wheezing and shortness of breath.

EXAM:
PORTABLE CHEST 1 VIEW

[chest ap]
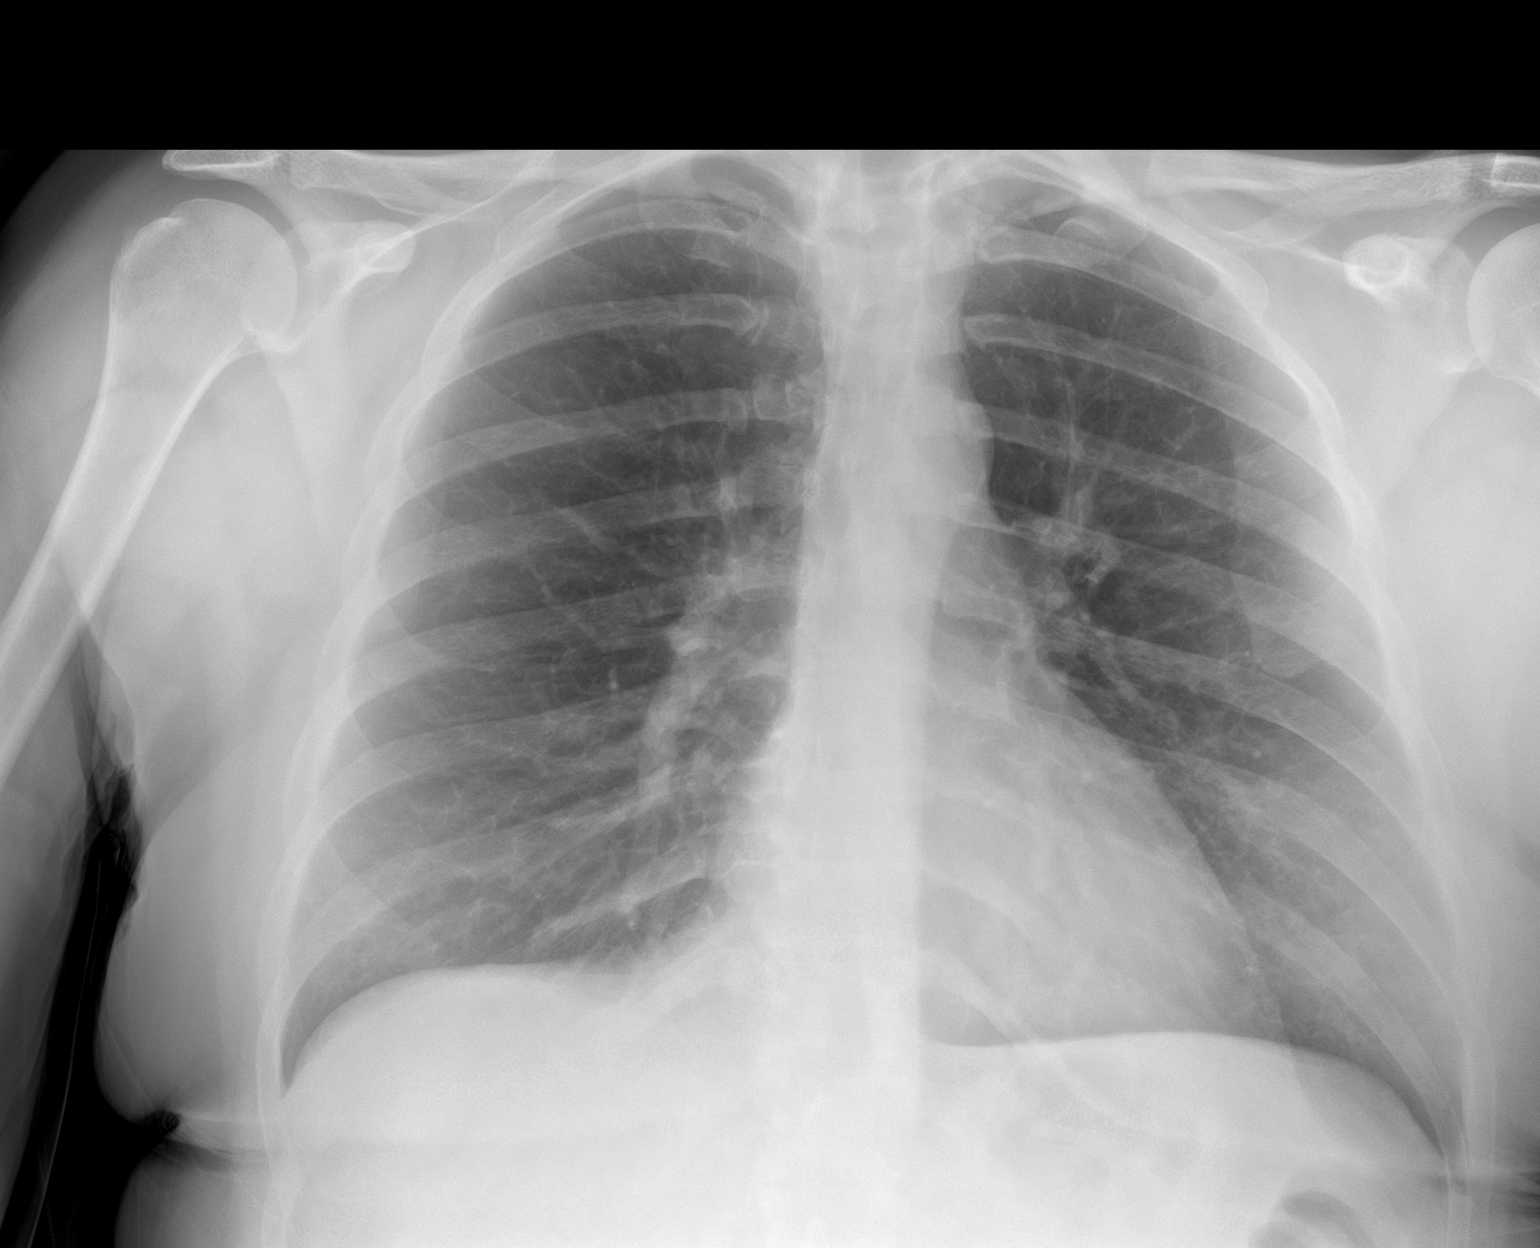

[1 of 1 positions shown; findings below may reference images not displayed]

FINDINGS: Cardiac silhouette is normal in size. Normal mediastinal and hilar
contours.

Lungs are clear.  No pleural effusion or pneumothorax.

Skeletal structures are grossly intact.
IMPRESSION: No active disease.

## 2021-02-19 ENCOUNTER — Other Ambulatory Visit: Payer: Self-pay

## 2021-02-19 MED ORDER — MEDROXYPROGESTERONE ACETATE 150 MG/ML IM SUSP: 150.0000 mg | INTRAMUSCULAR | 4 refills | Status: AC

## 2021-02-24 DIAGNOSIS — E7800 Pure hypercholesterolemia, unspecified: Secondary | ICD-10-CM | POA: Insufficient documentation

## 2021-02-24 DIAGNOSIS — I219 Acute myocardial infarction, unspecified: Secondary | ICD-10-CM | POA: Insufficient documentation

## 2021-03-26 DIAGNOSIS — G43909 Migraine, unspecified, not intractable, without status migrainosus: Secondary | ICD-10-CM | POA: Insufficient documentation

## 2021-08-07 IMAGING — DX DG CHEST 2V
2 series · 2 of 2 positions shown · non-contrast
Comparison: 12/15/2018

CLINICAL DATA: Chest pain.  COVID positive 09/14/2019.

EXAM:
CHEST - 2 VIEW

[chest pa]
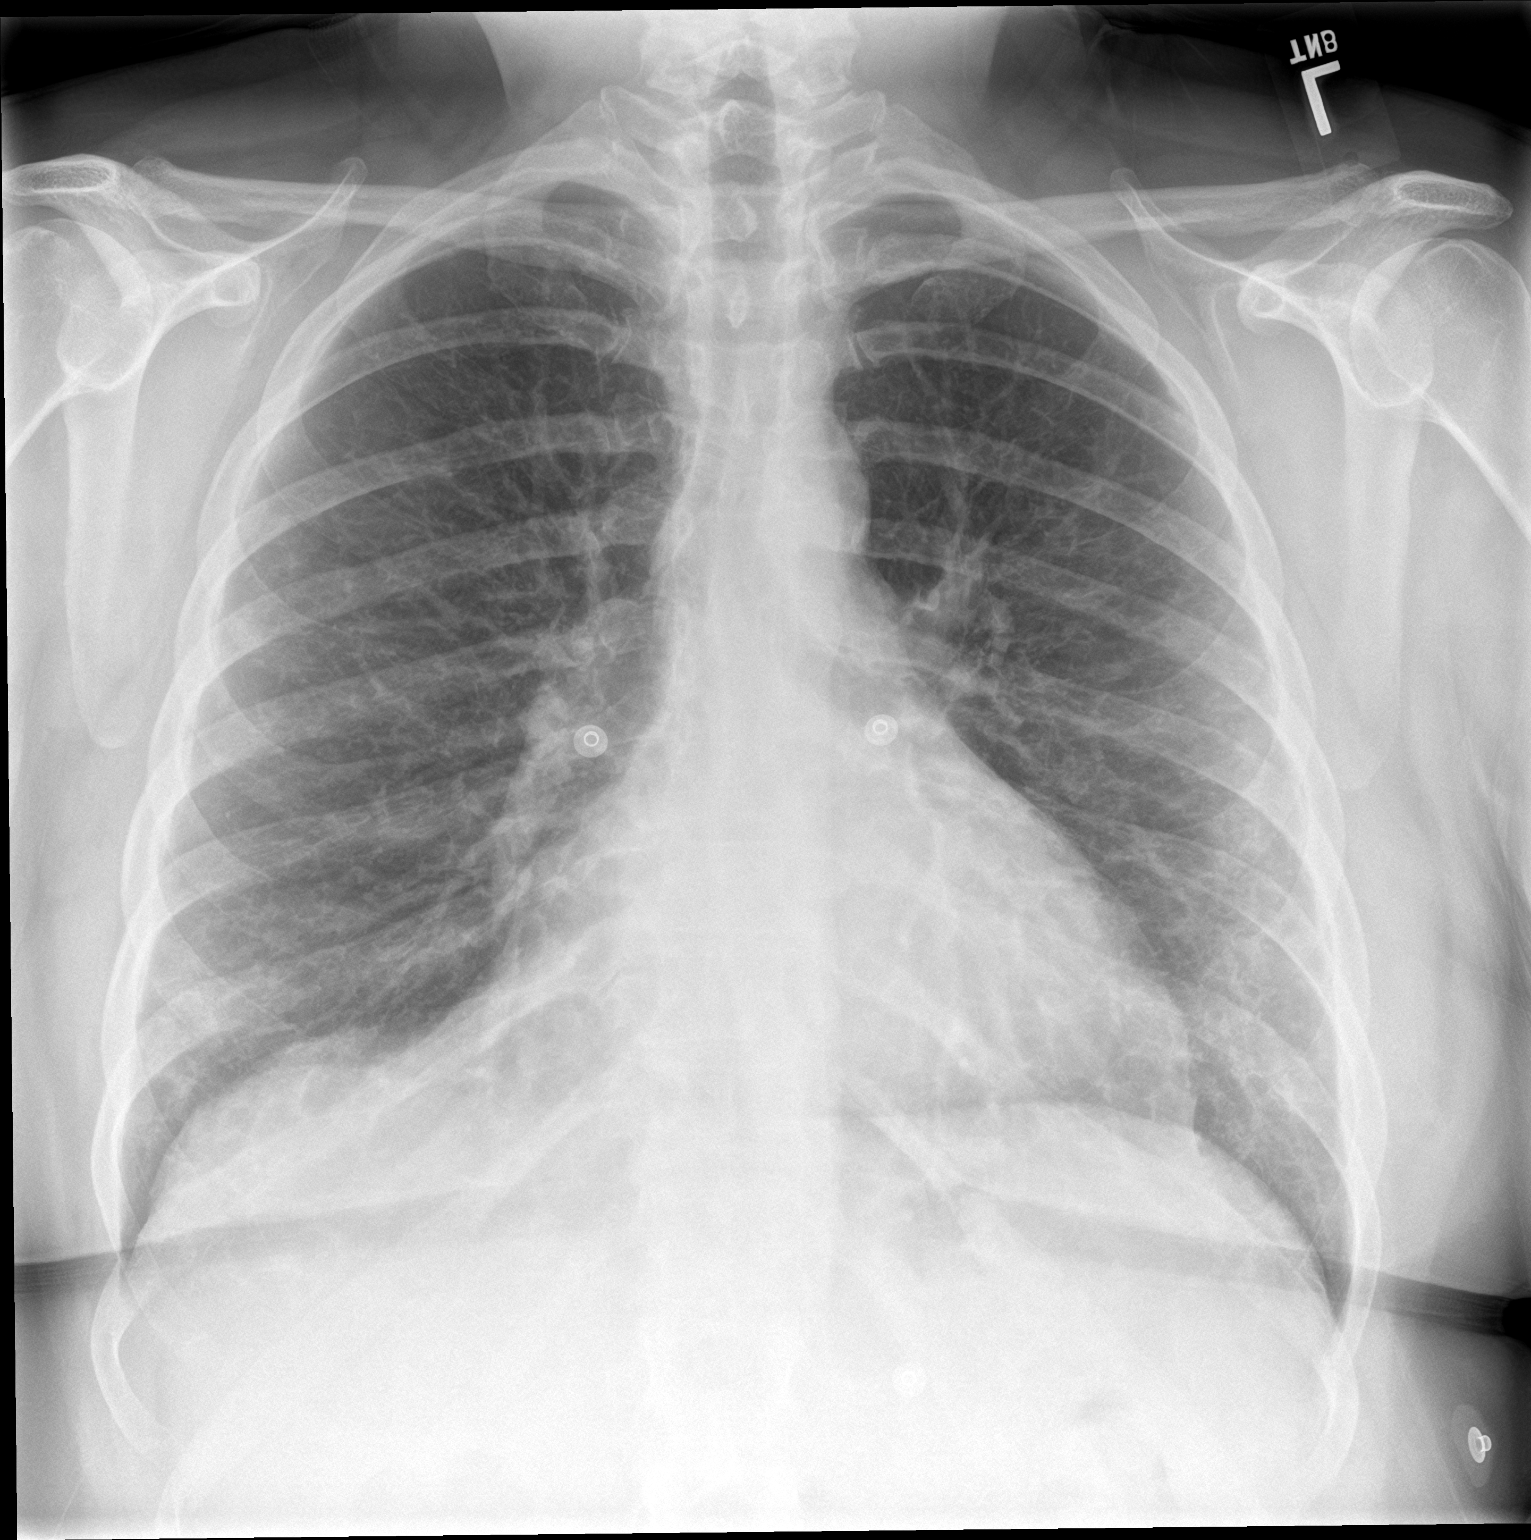

[chest lat]
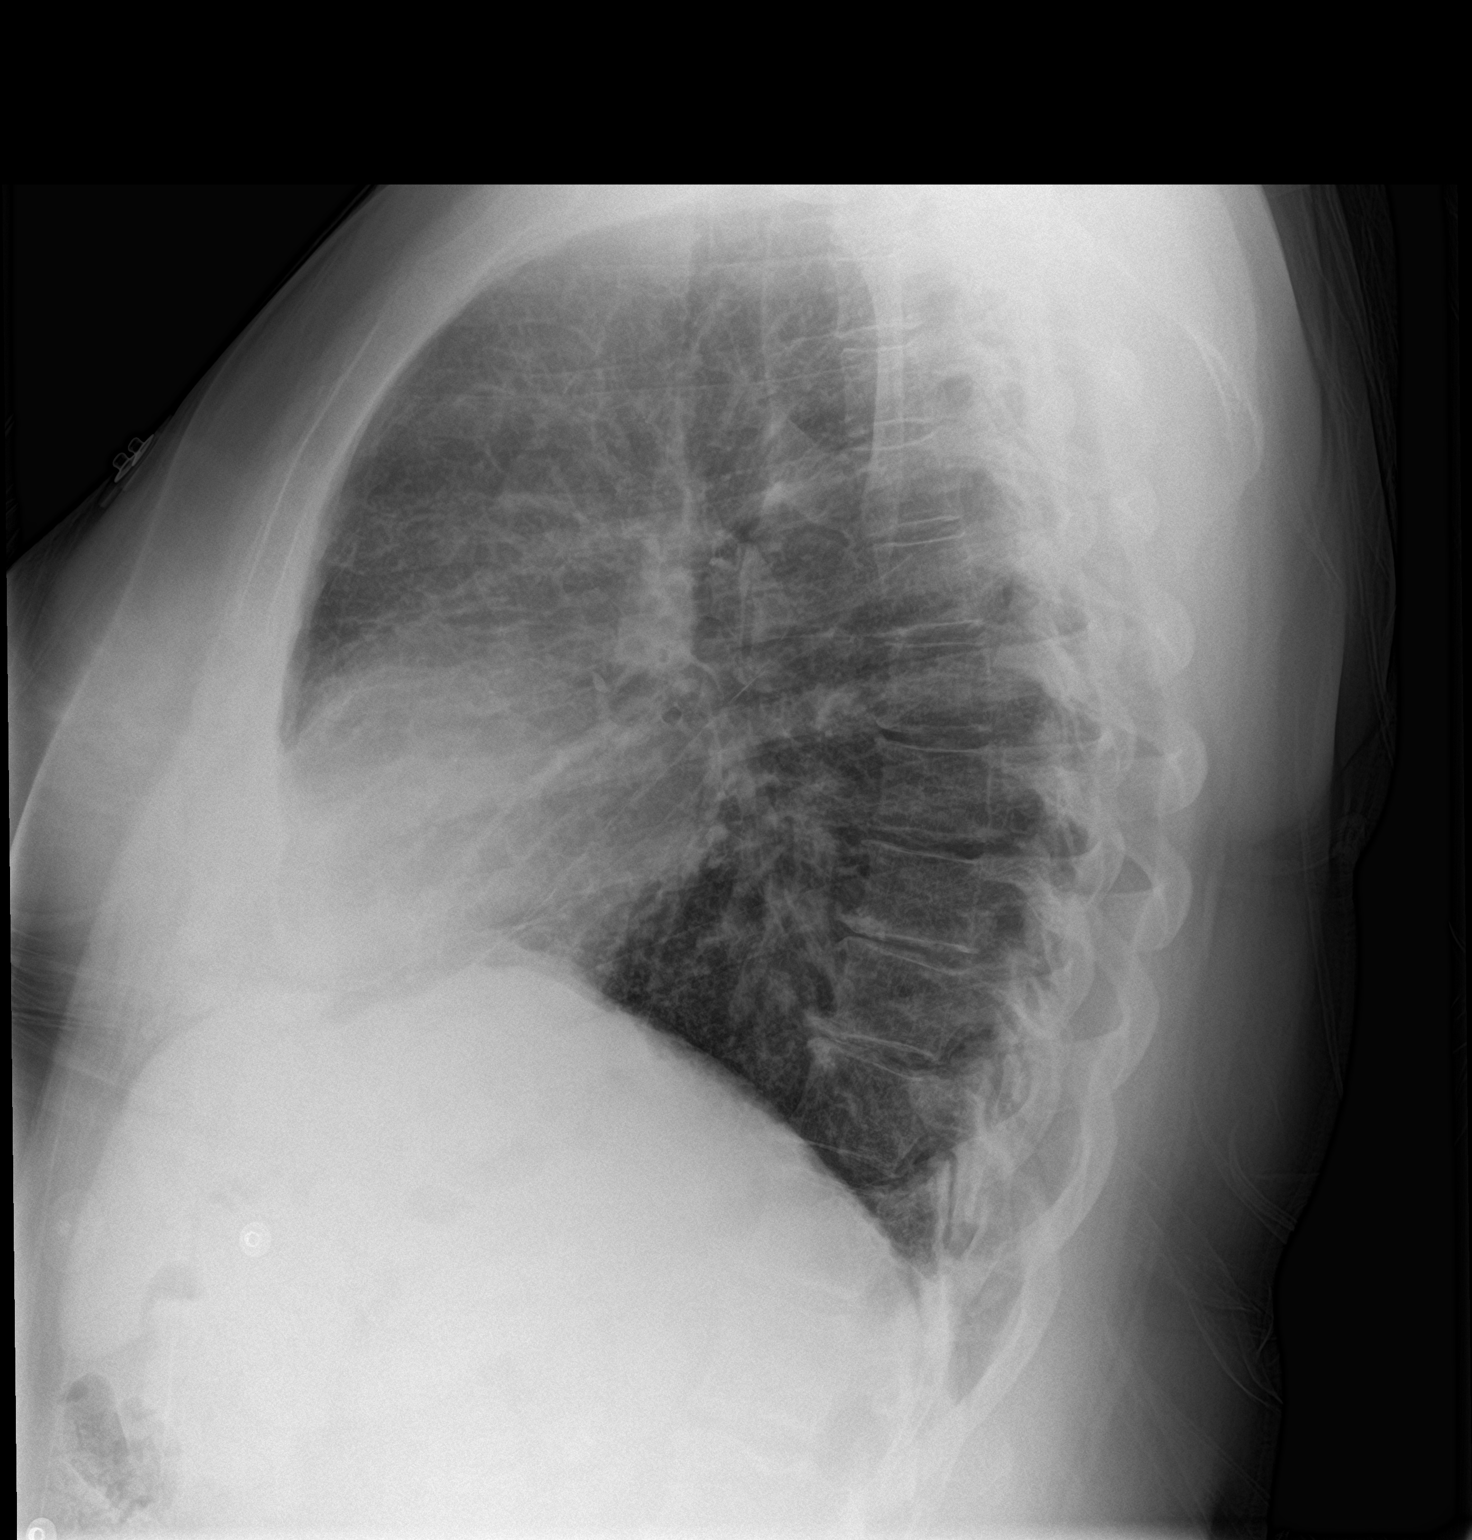

[2 of 2 positions shown; findings below may reference images not displayed]

FINDINGS: Cardiomediastinal contours within normal limits. Subtle interstitial
opacities at the peripheral aspect of the lung bases. No confluent
consolidation. No pleural effusions or pneumothorax. No acute
osseous abnormality.
IMPRESSION: Subtle interstitial opacities at the peripheral aspect of the lung
bases, which are new from prior and concerning for atypical
pneumonia (possibly related to patient's recent COVID diagnosis). No
confluent consolidation.

## 2022-02-01 ENCOUNTER — Ambulatory Visit: Payer: 59 | Attending: Family | Admitting: Family

## 2022-02-01 ENCOUNTER — Encounter (RURAL_HEALTH_CENTER): Payer: Self-pay | Admitting: Family

## 2022-02-01 ENCOUNTER — Telehealth (RURAL_HEALTH_CENTER): Payer: Self-pay | Admitting: Family

## 2022-02-01 ENCOUNTER — Other Ambulatory Visit: Payer: Self-pay

## 2022-02-01 ENCOUNTER — Ambulatory Visit (RURAL_HEALTH_CENTER): Payer: 59 | Attending: Family | Admitting: Family

## 2022-02-01 VITALS — BP 127/87 | HR 92 | Temp 98.3°F | Resp 18 | Ht 67.0 in | Wt 277.1 lb

## 2022-02-01 DIAGNOSIS — E559 Vitamin D deficiency, unspecified: Secondary | ICD-10-CM | POA: Insufficient documentation

## 2022-02-01 DIAGNOSIS — Z6841 Body Mass Index (BMI) 40.0 and over, adult: Secondary | ICD-10-CM | POA: Insufficient documentation

## 2022-02-01 DIAGNOSIS — I2510 Atherosclerotic heart disease of native coronary artery without angina pectoris: Secondary | ICD-10-CM | POA: Insufficient documentation

## 2022-02-01 DIAGNOSIS — E119 Type 2 diabetes mellitus without complications: Secondary | ICD-10-CM | POA: Insufficient documentation

## 2022-02-01 DIAGNOSIS — Z789 Other specified health status: Secondary | ICD-10-CM | POA: Insufficient documentation

## 2022-02-01 DIAGNOSIS — J3089 Other allergic rhinitis: Secondary | ICD-10-CM | POA: Insufficient documentation

## 2022-02-01 DIAGNOSIS — J4520 Mild intermittent asthma, uncomplicated: Secondary | ICD-10-CM | POA: Insufficient documentation

## 2022-02-01 DIAGNOSIS — I10 Essential (primary) hypertension: Secondary | ICD-10-CM | POA: Insufficient documentation

## 2022-02-01 DIAGNOSIS — J309 Allergic rhinitis, unspecified: Secondary | ICD-10-CM | POA: Insufficient documentation

## 2022-02-01 DIAGNOSIS — F419 Anxiety disorder, unspecified: Secondary | ICD-10-CM | POA: Insufficient documentation

## 2022-02-01 DIAGNOSIS — E785 Hyperlipidemia, unspecified: Secondary | ICD-10-CM | POA: Insufficient documentation

## 2022-02-01 DIAGNOSIS — J020 Streptococcal pharyngitis: Secondary | ICD-10-CM | POA: Insufficient documentation

## 2022-02-01 DIAGNOSIS — E538 Deficiency of other specified B group vitamins: Secondary | ICD-10-CM | POA: Insufficient documentation

## 2022-02-01 DIAGNOSIS — Z7985 Long-term (current) use of injectable non-insulin antidiabetic drugs: Secondary | ICD-10-CM | POA: Insufficient documentation

## 2022-02-01 DIAGNOSIS — F17290 Nicotine dependence, other tobacco product, uncomplicated: Secondary | ICD-10-CM | POA: Insufficient documentation

## 2022-02-01 DIAGNOSIS — Z7984 Long term (current) use of oral hypoglycemic drugs: Secondary | ICD-10-CM | POA: Insufficient documentation

## 2022-02-01 DIAGNOSIS — Z720 Tobacco use: Secondary | ICD-10-CM | POA: Insufficient documentation

## 2022-02-01 DIAGNOSIS — K219 Gastro-esophageal reflux disease without esophagitis: Secondary | ICD-10-CM | POA: Insufficient documentation

## 2022-02-01 HISTORY — DX: Streptococcal pharyngitis: J02.0

## 2022-02-01 LAB — POCT RAPID FLU
INFLUENZA TYPE A: NEGATIVE
INFLUENZA TYPE B: NEGATIVE

## 2022-02-01 LAB — POCT RAPID COVID (SOFIA) (AMB ONLY): COVID-19 AG: NEGATIVE

## 2022-02-01 LAB — POCT RAPID STREP A: RAPID STREP A (POCT): POSITIVE

## 2022-02-01 MED ORDER — CLINDAMYCIN HCL 300 MG CAPSULE
300.0000 mg | ORAL_CAPSULE | Freq: Three times a day (TID) | ORAL | 0 refills | Status: AC
Start: 2022-02-01 — End: 2022-02-11

## 2022-02-01 NOTE — Assessment & Plan Note (Addendum)
Symptoms well controlled with OTC omeprazole 20 mg b.i.d..

## 2022-02-01 NOTE — Progress Notes (Addendum)
Sharon Walker  MRN: [id number]  DOB: Jun 10, 1975  Date of Service: 02/01/2022    CHIEF COMPLAINT  Chief Complaint   Patient presents with    Sharon Walker is a 47 y.o. female who presents to clinic to establish care.  Former patient of [facility].  Presents without medical records.    Patient reports she is under the care Dr. Ralph Leyden, Dr. Viann Shove, Cardiologist and [facility]Dr. Oletta Lamas for anxiety/GYN.    Patient presents today with concerns regarding her medications, not sure of which medication she is on.  Patient presents without medical records.      Patient complains of bilateral ear pain, sore throat and sinus congestion x1 month.  Denies fever, cough, nausea, vomiting or diarrhea.  Denies chest pain shortness on breath.  Current smoker.  Patient reports she does take Symbicort and albuterol.  Patient reports she was on doxycycline 3 weeks ago to due to sinus infection which did not resolve.  Patient reports exposure to sick contacts.  Denies recent hospitalizations or surgeries..     Review of Systems:  Positive ROS discussed in HPI, otherwise all other systems negative.      Medications:   albuterol sulfate (PROVENTIL OR VENTOLIN OR PROAIR) 90 mcg/actuation Inhalation oral inhaler, Take 1-2 Puffs by inhalation Every 6 hours as needed for Other  atorvastatin (LIPITOR) 20 mg Oral Tablet, Take 1 Tablet (20 mg total) by mouth Once a day  budesonide-formoteroL (SYMBICORT) 160-4.5 mcg/actuation Inhalation oral inhaler, Take 2 Puffs by inhalation Twice daily  dilTIAZem (DILT-XR) 120 mg Oral Capsule,Degradable Cnt Release, Take 1 Capsule (120 mg total) by mouth Once a day  ergocalciferol, vitamin D2, (DRISDOL) 1,250 mcg (50,000 unit) Oral Capsule, Take 1 Capsule (50,000 Units total) by mouth Every 7 days  ezetimibe (ZETIA) 10 mg Oral Tablet, Take 1 Tablet (10 mg total) by mouth Every evening  flash  glucose scanning reader (FREESTYLE LIBRE 2 READER) Does not apply Misc, 1 Each Once a day  flash glucose sensor (FREESTYLE LIBRE 2 SENSOR N/A), 1 Each Every 7 days  JARDIANCE 10 mg Oral Tablet, Take 1 Tablet (10 mg total) by mouth Once a day  LORazepam (ATIVAN) 0.5 mg Oral Tablet, Take 1 Tablet (0.5 mg total) by mouth Twice per day as needed for Anxiety  medroxyPROGESTERone (DEPO-PROVERA) 150 mg/mL IntraMUSCULAR Syringe, Inject 1 mL (150 mg total) into the muscle Every 3 months  meloxicam (MOBIC) 15 mg Oral Tablet, Take 1 Tablet (15 mg total) by mouth Once a day  montelukast (SINGULAIR) 10 mg Oral Tablet, Take 1 Tablet (10 mg total) by mouth Every evening  nitroGLYCERIN (NITROSTAT) 0.4 mg Sublingual Tablet, Sublingual, Place 1 Tablet (0.4 mg total) under the tongue Every 5 minutes as needed for Chest pain  omeprazole (PRILOSEC) 20 mg Oral Capsule, Delayed Release(E.C.), Take 1 Capsule (20 mg total) by mouth Twice daily  OZEMPIC 0.25 mg or 0.5 mg (2 mg/3 mL) Subcutaneous Pen Injector, Inject 0.25 mg under the skin Every 7 days  pioglitazone (ACTOS) 45 mg Oral Tablet, Take 1 Tablet (45 mg total) by mouth Every evening  TRULICITY 3 HM/0.9 mL Subcutaneous Pen Injector, Inject 0.5 mL (3 mg total) under the skin Every 7 days  ascorbic acid (VITAMIN C) 1,000 mg Oral Tablet, Take 1 Tablet (1,000 mg total) by mouth Once a day  aspirin (ECOTRIN) 81 mg  Oral Tablet, Delayed Release (E.C.), Take 1 Tablet (81 mg total) by mouth Once a day  Butalbital-Acetaminophen-Caff 50-300-40 mg Oral Capsule, Take 1 Capsule by mouth Every 4 hours as needed for Other  cetirizine (ZYRTEC) 10 mg Oral Tablet, Take 1 Tablet (10 mg total) by mouth Once a day  ondansetron (ZOFRAN ODT) 4 mg Oral Tablet, Rapid Dissolve, Take 1 Tablet (4 mg total) by mouth Every 8 hours as needed for Nausea/Vomiting    No facility-administered medications prior to visit.      Allergies:   Allergies   Allergen Reactions    Amoxicillin-Pot Clavulanate           OBJECTIVE  BP 127/87 (Site: Left, Patient Position: Sitting)   Pulse 92   Temp 36.8 C (98.3 F) (Temporal)   Resp 18   Ht 1.702 m (5\' 7" )   Wt 126 kg (277 lb 2 oz)   SpO2 97%   BMI 43.40 kg/m       Physical Exam  Vitals reviewed.   Constitutional:       Appearance: She is obese.   HENT:      Head: Normocephalic.      Nose: Rhinorrhea present. Rhinorrhea is clear.      Right Turbinates: Pale.      Left Turbinates: Pale.      Right Sinus: Maxillary sinus tenderness present. No frontal sinus tenderness.      Left Sinus: Maxillary sinus tenderness present. No frontal sinus tenderness.      Mouth/Throat:      Lips: Pink.      Mouth: Mucous membranes are moist.      Dentition: Abnormal dentition.      Pharynx: Posterior oropharyngeal erythema present. No oropharyngeal exudate.   Eyes:      Extraocular Movements: Extraocular movements intact.      Conjunctiva/sclera: Conjunctivae normal.   Cardiovascular:      Rate and Rhythm: Normal rate and regular rhythm.      Heart sounds: Normal heart sounds, S1 normal and S2 normal.   Lymphadenopathy:      Cervical: Cervical adenopathy present.   Skin:     Findings: No rash.   Neurological:      Mental Status: She is alert.      Coordination: Coordination is intact.      Gait: Gait is intact.   Psychiatric:         Mood and Affect: Mood normal.         Behavior: Behavior is cooperative.         Cognition and Memory: Cognition normal.           ASSESSMENT/PLAN  (E11.9) Type 2 diabetes mellitus (CMS HCC)  (primary encounter diagnosis)  Plan: HGA1C (HEMOGLOBIN A1C WITH EST AVG GLUCOSE),         MICROALBUMIN/CREATININE RATIO, URINE, RANDOM,         CBC, COMPREHENSIVE METABOLIC PNL, FASTING,         LIPID PANEL, MAGNESIUM, THYROID STIMULATING         HORMONE (SENSITIVE TSH), VITAMIN D 25 TOTAL,         VITAMIN B12    (I10) Hypertension, unspecified type  Plan: HGA1C (HEMOGLOBIN A1C WITH EST AVG GLUCOSE),         MICROALBUMIN/CREATININE RATIO, URINE, RANDOM,         CBC,  COMPREHENSIVE METABOLIC PNL, FASTING,         LIPID PANEL, MAGNESIUM, THYROID STIMULATING  HORMONE (SENSITIVE TSH), VITAMIN D 25 TOTAL,         VITAMIN B12    (J45.20) Mild intermittent asthma without complication  Plan: HGA1C (HEMOGLOBIN A1C WITH EST AVG GLUCOSE),         MICROALBUMIN/CREATININE RATIO, URINE, RANDOM,         CBC, COMPREHENSIVE METABOLIC PNL, FASTING,         LIPID PANEL, MAGNESIUM, THYROID STIMULATING         HORMONE (SENSITIVE TSH), VITAMIN D 25 TOTAL,         VITAMIN B12, COVID-19, FLU A/B, RSV RAPID BY         PCR    (E78.5) Hyperlipidemia, unspecified hyperlipidemia type  Plan: HGA1C (HEMOGLOBIN A1C WITH EST AVG GLUCOSE),         MICROALBUMIN/CREATININE RATIO, URINE, RANDOM,         CBC, COMPREHENSIVE METABOLIC PNL, FASTING,         LIPID PANEL, MAGNESIUM, THYROID STIMULATING         HORMONE (SENSITIVE TSH), VITAMIN D 25 TOTAL,         VITAMIN B12    (K21.9) GERD (gastroesophageal reflux disease)  Plan: HGA1C (HEMOGLOBIN A1C WITH EST AVG GLUCOSE),         MICROALBUMIN/CREATININE RATIO, URINE, RANDOM,         CBC, COMPREHENSIVE METABOLIC PNL, FASTING,         LIPID PANEL, MAGNESIUM, THYROID STIMULATING         HORMONE (SENSITIVE TSH), VITAMIN D 25 TOTAL,         VITAMIN B12    (E55.9) Vitamin D deficiency  Plan: HGA1C (HEMOGLOBIN A1C WITH EST AVG GLUCOSE),         MICROALBUMIN/CREATININE RATIO, URINE, RANDOM,         CBC, COMPREHENSIVE METABOLIC PNL, FASTING,         LIPID PANEL, MAGNESIUM, THYROID STIMULATING         HORMONE (SENSITIVE TSH), VITAMIN D 25 TOTAL,         VITAMIN B12    (E53.8) Vitamin B12 deficiency  Plan: HGA1C (HEMOGLOBIN A1C WITH EST AVG GLUCOSE),         MICROALBUMIN/CREATININE RATIO, URINE, RANDOM,         CBC, COMPREHENSIVE METABOLIC PNL, FASTING,         LIPID PANEL, MAGNESIUM, THYROID STIMULATING         HORMONE (SENSITIVE TSH), VITAMIN D 25 TOTAL,         VITAMIN B12    (J30.89) Environmental and seasonal allergies  Plan: HGA1C (HEMOGLOBIN A1C WITH EST AVG  GLUCOSE),         MICROALBUMIN/CREATININE RATIO, URINE, RANDOM,         CBC, COMPREHENSIVE METABOLIC PNL, FASTING,         LIPID PANEL, MAGNESIUM, THYROID STIMULATING         HORMONE (SENSITIVE TSH), VITAMIN D 25 TOTAL,         VITAMIN B12    (E66.01,  Z68.41) Morbid obesity with BMI of 40.0-44.9, adult (CMS HCC)  Plan: HGA1C (HEMOGLOBIN A1C WITH EST AVG GLUCOSE),         MICROALBUMIN/CREATININE RATIO, URINE, RANDOM,         CBC, COMPREHENSIVE METABOLIC PNL, FASTING,         LIPID PANEL, MAGNESIUM, THYROID STIMULATING         HORMONE (SENSITIVE TSH), VITAMIN D 25 TOTAL,         VITAMIN B12    (  F41.9) Anxiety    (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 Renda Rolls, Cardiology.  Requested last office note.         Relevant Orders    HGA1C (HEMOGLOBIN A1C WITH EST AVG GLUCOSE)    MICROALBUMIN/CREATININE RATIO, URINE, RANDOM    CBC    COMPREHENSIVE METABOLIC PNL, FASTING    LIPID PANEL    MAGNESIUM    THYROID STIMULATING HORMONE (SENSITIVE TSH)    VITAMIN D 25 TOTAL    VITAMIN B12    Hyperlipidemia     BP controlled.  Managed by Cardiology.  Continue current medications.         Relevant Orders    HGA1C (HEMOGLOBIN A1C WITH EST AVG GLUCOSE)    MICROALBUMIN/CREATININE RATIO, URINE, RANDOM    CBC    COMPREHENSIVE METABOLIC PNL, FASTING    LIPID PANEL    MAGNESIUM    THYROID STIMULATING HORMONE (SENSITIVE TSH)    VITAMIN D 25 TOTAL    VITAMIN B12       Respiratory    Vaping nicotine dependence, tobacco product     Started smoking cigarettes in 1992 and stopped smoking cigarettes in 2020.  In 2020, started vaping nicotine, 1 cartridge every 3 days.  Strongly advised smoking cessation.  Patient is not interested in smoking cessation referral this time.         Mild  intermittent asthma without complication    Relevant Orders    HGA1C (HEMOGLOBIN A1C WITH EST AVG GLUCOSE)    MICROALBUMIN/CREATININE RATIO, URINE, RANDOM    CBC    COMPREHENSIVE METABOLIC PNL, FASTING    LIPID PANEL    MAGNESIUM    THYROID STIMULATING HORMONE (SENSITIVE TSH)    VITAMIN D 25 TOTAL    VITAMIN B12    COVID-19, FLU A/B, RSV RAPID BY PCR       Digestive    GERD (gastroesophageal reflux disease)     Symptoms well controlled with OTC omeprazole 20 mg b.i.d..         Relevant Orders    HGA1C (HEMOGLOBIN A1C WITH EST AVG GLUCOSE)    MICROALBUMIN/CREATININE RATIO, URINE, RANDOM    CBC    COMPREHENSIVE METABOLIC PNL, FASTING    LIPID PANEL    MAGNESIUM    THYROID STIMULATING HORMONE (SENSITIVE TSH)    VITAMIN D 25 TOTAL    VITAMIN B12    Strep pharyngitis     Start clindamycin 300 mg t.i.d. times 10 days for strep pharyngitis. Complete antibiotic therapy as prescribed.  Advise over-the-counter Tylenol/Motrin as needed.  Hydrate well. Discussed strict hand washing.  Avoid sharing food/drinks.  Patient will change toothbrush after 2 full days of antibiotic therapy.  Advise to follow-up if symptoms progress or do not resolve.          Relevant Orders    COVID-19, FLU A/B, RSV RAPID BY PCR    POCT Rapid Flu (Completed)    POCT Rapid Strep (Completed)    POCT Rapid Covid Keenan Bachelor) (Completed)       Endocrine    Type 2 diabetes mellitus (CMS HCC) - Primary  Further treatment pending labs.  Patient reports she is unable to tolerate metformin.  Patient reports Dr. Ninfa Meeker id, cardiologist has started her on Trulicity 3 mg weekly Jardiance 10 mg daily and pioglitazone 45 mg daily and recently started her on Ozempic 0.25 mg every 7 days, which patient reports she has not started yet..  Patient monitors glucose with freestyle Libre.  Denies hypoglycemia..         Relevant Orders    HGA1C (HEMOGLOBIN A1C WITH EST AVG GLUCOSE)    MICROALBUMIN/CREATININE RATIO, URINE, RANDOM    CBC    COMPREHENSIVE METABOLIC PNL, FASTING     LIPID PANEL    MAGNESIUM    THYROID STIMULATING HORMONE (SENSITIVE TSH)    VITAMIN D 25 TOTAL    VITAMIN B12    Vitamin D deficiency     Continue weekly vitamin-D, further treatment pending labs         Relevant Orders    HGA1C (HEMOGLOBIN A1C WITH EST AVG GLUCOSE)    MICROALBUMIN/CREATININE RATIO, URINE, RANDOM    CBC    COMPREHENSIVE METABOLIC PNL, FASTING    LIPID PANEL    MAGNESIUM    THYROID STIMULATING HORMONE (SENSITIVE TSH)    VITAMIN D 25 TOTAL    VITAMIN B12    Vitamin B12 deficiency     Takes daily multivitamin of vitamin B12 500 mcg daily.         Relevant Orders    HGA1C (HEMOGLOBIN A1C WITH EST AVG GLUCOSE)    MICROALBUMIN/CREATININE RATIO, URINE, RANDOM    CBC    COMPREHENSIVE METABOLIC PNL, FASTING    LIPID PANEL    MAGNESIUM    THYROID STIMULATING HORMONE (SENSITIVE TSH)    VITAMIN D 25 TOTAL    VITAMIN B12       Other    Environmental and seasonal allergies     Managed by Dr. Sherilyn Banker.           Relevant Orders    HGA1C (HEMOGLOBIN A1C WITH EST AVG GLUCOSE)    MICROALBUMIN/CREATININE RATIO, URINE, RANDOM    CBC    COMPREHENSIVE METABOLIC PNL, FASTING    LIPID PANEL    MAGNESIUM    THYROID STIMULATING HORMONE (SENSITIVE TSH)    VITAMIN D 25 TOTAL    VITAMIN B12    Morbid obesity with BMI of 40.0-44.9, adult (CMS HCC)     Discussed a low-fat/low-sodium diet, advised 150 minutes of scheduled weekly physical activity and continue with weight loss efforts.         Relevant Orders    HGA1C (HEMOGLOBIN A1C WITH EST AVG GLUCOSE)    MICROALBUMIN/CREATININE RATIO, URINE, RANDOM    CBC    COMPREHENSIVE METABOLIC PNL, FASTING    LIPID PANEL    MAGNESIUM    THYROID STIMULATING HORMONE (SENSITIVE TSH)    VITAMIN D 25 TOTAL    VITAMIN B12     Other Visit Diagnoses       Anxiety               Orders Placed This Encounter    HGA1C (HEMOGLOBIN A1C WITH EST AVG GLUCOSE)    MICROALBUMIN/CREATININE RATIO, URINE, RANDOM    CBC    COMPREHENSIVE METABOLIC PNL, FASTING    LIPID PANEL    MAGNESIUM    THYROID STIMULATING  HORMONE (SENSITIVE TSH)    VITAMIN D 25 TOTAL    VITAMIN B12    COVID-19, FLU A/B, RSV RAPID BY PCR    POCT  Rapid Flu    POCT Rapid Strep    POCT Rapid Covid ([facility])    clindamycin (CLEOCIN) 300 mg Oral Capsule      Requested medical records from Dr. Ralph Leyden, allergy and immunology.  Requested medical records from Cardiology, Dr. Viann Shove.  Patient requested a refill of Trulicity due to type 2 diabetes and medication was sent to pharmacy.  Patient will return to clinic in 1 week to discuss chronic disease management and other medication refills.  Labs drawn today.  Plan of care agreed to.  Return in about 1 week (around 02/08/2022) for 1 week for chronic disease management.    Jodi Mourning, NP-C 02/01/2022, 18:11

## 2022-02-01 NOTE — Assessment & Plan Note (Signed)
Further treatment pending labs.  Patient reports she is unable to tolerate metformin.  Patient reports Dr. Eldine Rencher Ha id, cardiologist has started her on Trulicity 3 mg weekly Jardiance 10 mg daily and pioglitazone 45 mg daily and recently started her on Ozempic 0.25 mg every 7 days, which patient reports she has not started yet..  Patient monitors glucose with freestyle Libre.  Denies hypoglycemia.Marland Kitchen

## 2022-02-01 NOTE — Addendum Note (Signed)
Addended by: Lily Kocher on: 02/01/2022 06:11 PM     Modules accepted: Orders

## 2022-02-01 NOTE — Assessment & Plan Note (Signed)
Denies SI/HI/AVH.  Patient is not interested in psychology/psychiatry referral at this time.  Patient will continue to follow up with tablets a [facility]/Dr. Oletta Lamas for Ativan refills.

## 2022-02-01 NOTE — Assessment & Plan Note (Signed)
Takes daily multivitamin of vitamin B12 500 mcg daily.

## 2022-02-01 NOTE — Assessment & Plan Note (Signed)
Managed by a tap of the [facility]/Dr. Oletta Lamas.  Patient has Depo-Provera injections every 3 months.

## 2022-02-01 NOTE — Assessment & Plan Note (Addendum)
Started smoking cigarettes in 1992 and stopped smoking cigarettes in 2020.  In 2020, started vaping nicotine, 1 cartridge every 3 days.  Strongly advised smoking cessation.  Patient is not interested in smoking cessation referral this time.

## 2022-02-01 NOTE — Assessment & Plan Note (Signed)
BP controlled.  Managed by Cardiology.  Continue current medications.

## 2022-02-01 NOTE — Assessment & Plan Note (Signed)
Managed by Dr. Ralph Leyden, allergy and immunology.  Patient will continue Symbicort daily and albuterol as needed.  Strongly advised vaping cessation.

## 2022-02-01 NOTE — Assessment & Plan Note (Signed)
Denies SI/HI/AVH.  Managed by Georgiann Mohs, NP/Dr. Oletta Lamas.  Ativan prescribed by GYN.

## 2022-02-01 NOTE — Assessment & Plan Note (Signed)
Managed by Dr. Ralph Leyden.

## 2022-02-01 NOTE — Nursing Note (Signed)
Patient today to establish care. Patient states that she was seeing a provider at [facility] and she left. Patient states that she is needing medication refills.

## 2022-02-01 NOTE — Assessment & Plan Note (Signed)
Managed by Dr. Viann Shove, Cardiology.  Requested last office note.

## 2022-02-01 NOTE — Assessment & Plan Note (Signed)
Start clindamycin 300 mg t.i.d. times 10 days for strep pharyngitis.Complete antibiotic therapy as prescribed.  Advise over-the-counter Tylenol/Motrin as needed.  Hydrate well. Discussed strict hand washing.  Avoid sharing food/drinks.  Patient will change toothbrush after 2 full days of antibiotic therapy.  Advise to follow-up if symptoms progress or do not resolve.

## 2022-02-01 NOTE — Assessment & Plan Note (Signed)
Discussed a low-fat/low-sodium diet, advised 150 minutes of scheduled weekly physical activity and continue with weight loss efforts.

## 2022-02-01 NOTE — Assessment & Plan Note (Signed)
Continue weekly vitamin-D, further treatment pending labs.

## 2022-02-02 LAB — COMPREHENSIVE METABOLIC PNL, FASTING
ALBUMIN/GLOBULIN RATIO: 1.5 — ABNORMAL HIGH (ref 0.8–1.4)
ALBUMIN: 4.3 g/dL (ref 3.5–5.7)
ALKALINE PHOSPHATASE: 65 U/L (ref 34–104)
ALT (SGPT): 13 U/L (ref 7–52)
ANION GAP: 8 mmol/L (ref 4–13)
AST (SGOT): 14 U/L (ref 13–39)
BILIRUBIN TOTAL: 0.4 mg/dL (ref 0.3–1.2)
BUN/CREA RATIO: 10 (ref 6–22)
BUN: 9 mg/dL (ref 7–25)
CALCIUM, CORRECTED: 8.7 mg/dL — ABNORMAL LOW (ref 8.9–10.8)
CALCIUM: 8.9 mg/dL (ref 8.6–10.3)
CHLORIDE: 105 mmol/L (ref 98–107)
CO2 TOTAL: 25 mmol/L (ref 21–31)
CREATININE: 0.88 mg/dL (ref 0.60–1.30)
ESTIMATED GFR: 82 mL/min/{1.73_m2} (ref >59)
GLOBULIN: 2.9 (ref 2.9–5.4)
GLUCOSE: 136 mg/dL — ABNORMAL HIGH (ref 74–109)
OSMOLALITY, CALCULATED: 277 mOsm/kg (ref 270–290)
POTASSIUM: 3.7 mmol/L (ref 3.5–5.1)
PROTEIN TOTAL: 7.2 g/dL (ref 6.4–8.9)
SODIUM: 138 mmol/L (ref 136–145)

## 2022-02-02 LAB — CBC
HCT: 41.8 % (ref 31.2–41.9)
HGB: 13.9 g/dL (ref 10.9–14.3)
MCH: 30.7 pg (ref 24.7–32.8)
MCHC: 33.2 g/dL (ref 32.3–35.6)
MCV: 92.5 fL (ref 75.5–95.3)
MPV: 9.6 fL (ref 7.9–10.8)
PLATELETS: 237 10*3/uL (ref 140–440)
RBC: 4.52 10*6/uL (ref 3.63–4.92)
RDW: 13.7 % (ref 12.3–17.7)
WBC: 7.9 10*3/uL (ref 3.8–11.8)

## 2022-02-02 LAB — HGA1C (HEMOGLOBIN A1C WITH EST AVG GLUCOSE): HEMOGLOBIN A1C: 6.5 % — ABNORMAL HIGH (ref 4.0–6.0)

## 2022-02-02 LAB — LIPID PANEL
CHOL/HDL RATIO: 2.7
CHOLESTEROL: 98 mg/dL (ref <200)
HDL CHOL: 36 mg/dL (ref 23–92)
LDL CALC: 43 mg/dL (ref 0–100)
TRIGLYCERIDES: 93 mg/dL (ref <=150)
VLDL CALC: 19 mg/dL (ref 0–50)

## 2022-02-02 LAB — COVID-19, FLU A/B, RSV RAPID BY PCR
INFLUENZA VIRUS TYPE A: NOT DETECTED
INFLUENZA VIRUS TYPE B: NOT DETECTED
RESPIRATORY SYNCTIAL VIRUS (RSV): NOT DETECTED
SARS-CoV-2: NOT DETECTED

## 2022-02-02 LAB — THYROID STIMULATING HORMONE (SENSITIVE TSH): TSH: 1.451 u[IU]/mL (ref 0.450–5.330)

## 2022-02-02 LAB — MICROALBUMIN/CREATININE RATIO, URINE, RANDOM
CREATININE RANDOM URINE: 82 mg/dL — ABNORMAL HIGH (ref 11–26)
MICROALBUMIN RANDOM URINE: <0.2 mg/dL

## 2022-02-02 LAB — MAGNESIUM: MAGNESIUM: 2.1 mg/dL (ref 1.9–2.7)

## 2022-02-02 LAB — VITAMIN B12: VITAMIN B 12: 212 pg/mL (ref 180–914)

## 2022-02-02 LAB — VITAMIN D 25 TOTAL: VITAMIN D: 46 ng/mL (ref 30–100)

## 2022-02-04 ENCOUNTER — Telehealth (RURAL_HEALTH_CENTER): Payer: Self-pay | Admitting: Family

## 2022-02-04 ENCOUNTER — Other Ambulatory Visit: Payer: Self-pay

## 2022-02-04 NOTE — Telephone Encounter (Signed)
Per verbal conversation with cathy it is okay to give patient work excuse.    Patient will pick up at her appt on 02/09/22.

## 2022-02-07 ENCOUNTER — Other Ambulatory Visit (RURAL_HEALTH_CENTER): Payer: Self-pay | Admitting: Family

## 2022-02-07 DIAGNOSIS — Z955 Presence of coronary angioplasty implant and graft: Secondary | ICD-10-CM | POA: Insufficient documentation

## 2022-02-07 DIAGNOSIS — J45909 Unspecified asthma, uncomplicated: Secondary | ICD-10-CM

## 2022-02-07 DIAGNOSIS — G43011 Migraine without aura, intractable, with status migrainosus: Secondary | ICD-10-CM | POA: Insufficient documentation

## 2022-02-07 DIAGNOSIS — J329 Chronic sinusitis, unspecified: Secondary | ICD-10-CM

## 2022-02-07 DIAGNOSIS — M1652 Unilateral post-traumatic osteoarthritis, left hip: Secondary | ICD-10-CM

## 2022-02-07 HISTORY — DX: Chronic sinusitis, unspecified: J32.9

## 2022-02-07 HISTORY — DX: Presence of coronary angioplasty implant and graft: Z95.5

## 2022-02-08 ENCOUNTER — Encounter (RURAL_HEALTH_CENTER): Payer: Self-pay | Admitting: Family

## 2022-02-09 ENCOUNTER — Other Ambulatory Visit: Payer: Self-pay

## 2022-02-09 ENCOUNTER — Ambulatory Visit (RURAL_HEALTH_CENTER): Payer: 59 | Attending: Family | Admitting: Family

## 2022-02-09 ENCOUNTER — Encounter (RURAL_HEALTH_CENTER): Payer: Self-pay | Admitting: Family

## 2022-02-09 VITALS — BP 135/80 | HR 84 | Temp 98.3°F | Resp 17 | Ht 67.0 in | Wt 276.2 lb

## 2022-02-09 DIAGNOSIS — E119 Type 2 diabetes mellitus without complications: Secondary | ICD-10-CM | POA: Insufficient documentation

## 2022-02-09 DIAGNOSIS — J3089 Other allergic rhinitis: Secondary | ICD-10-CM | POA: Insufficient documentation

## 2022-02-09 DIAGNOSIS — E785 Hyperlipidemia, unspecified: Secondary | ICD-10-CM | POA: Insufficient documentation

## 2022-02-09 DIAGNOSIS — E538 Deficiency of other specified B group vitamins: Secondary | ICD-10-CM

## 2022-02-09 DIAGNOSIS — F17220 Nicotine dependence, chewing tobacco, uncomplicated: Secondary | ICD-10-CM | POA: Insufficient documentation

## 2022-02-09 DIAGNOSIS — I10 Essential (primary) hypertension: Secondary | ICD-10-CM

## 2022-02-09 DIAGNOSIS — K219 Gastro-esophageal reflux disease without esophagitis: Secondary | ICD-10-CM

## 2022-02-09 DIAGNOSIS — J4520 Mild intermittent asthma, uncomplicated: Secondary | ICD-10-CM

## 2022-02-09 DIAGNOSIS — E559 Vitamin D deficiency, unspecified: Secondary | ICD-10-CM

## 2022-02-09 DIAGNOSIS — Z6841 Body Mass Index (BMI) 40.0 and over, adult: Secondary | ICD-10-CM | POA: Insufficient documentation

## 2022-02-09 DIAGNOSIS — F17290 Nicotine dependence, other tobacco product, uncomplicated: Secondary | ICD-10-CM | POA: Insufficient documentation

## 2022-02-09 DIAGNOSIS — J45909 Unspecified asthma, uncomplicated: Secondary | ICD-10-CM

## 2022-02-09 DIAGNOSIS — F411 Generalized anxiety disorder: Secondary | ICD-10-CM

## 2022-02-09 DIAGNOSIS — Z789 Other specified health status: Secondary | ICD-10-CM

## 2022-02-09 DIAGNOSIS — Z7985 Long-term (current) use of injectable non-insulin antidiabetic drugs: Secondary | ICD-10-CM | POA: Insufficient documentation

## 2022-02-09 MED ORDER — ERGOCALCIFEROL (VITAMIN D2) 1,250 MCG (50,000 UNIT) CAPSULE
50000.0000 [IU] | ORAL_CAPSULE | ORAL | 0 refills | Status: AC
Start: 2022-02-09 — End: 2022-05-10

## 2022-02-09 MED ORDER — ATORVASTATIN 20 MG TABLET
20.0000 mg | ORAL_TABLET | Freq: Every day | ORAL | 0 refills | Status: AC
Start: 2022-02-09 — End: 2022-05-10

## 2022-02-09 MED ORDER — OZEMPIC 0.25 MG OR 0.5 MG (2 MG/3 ML) SUBCUTANEOUS PEN INJECTOR
0.5000 mg | PEN_INJECTOR | SUBCUTANEOUS | 0 refills | Status: DC
Start: 2022-02-09 — End: 2022-03-23

## 2022-02-09 MED ORDER — FLUTICASONE FUROATE 100 MCG-VILANTEROL 25 MCG/DOSE INHALATION POWDER
1.0000 | DISK | Freq: Every day | RESPIRATORY_TRACT | 0 refills | Status: DC
Start: 2022-02-09 — End: 2022-03-23

## 2022-02-09 MED ORDER — DEXAMETHASONE SODIUM PHOSPHATE 4 MG/ML INJECTION SOLUTION
4.0000 mg | INTRAMUSCULAR | Status: AC
Start: 2022-02-09 — End: 2022-02-09
  Administered 2022-02-09: 10 mg via INTRAMUSCULAR

## 2022-02-09 MED ORDER — OMEPRAZOLE 20 MG CAPSULE,DELAYED RELEASE
20.0000 mg | DELAYED_RELEASE_CAPSULE | Freq: Two times a day (BID) | ORAL | 0 refills | Status: AC
Start: 2022-02-09 — End: 2022-05-10

## 2022-02-09 NOTE — Progress Notes (Signed)
FAMILY MEDICINE, [place]  [place] [id number]  Operated by [facility]     Name: Sharon Walker MRN:  [id number]   Date of Birth: 1975/04/30 Age: 47 y.o.   Date: 02/09/2022  Time: 13:17     Provider: Jodi Mourning, NP-C    Reason for visit: Other (Patient here for CDM visit, needs refills on medication)      History of Present Illness:  Sharon Walker is a 47 y.o. female presenting with chronic disease management and to discuss labs..    Patient Active Problem List    Diagnosis Date Noted    Post-traumatic osteoarthritis of left hip 02/07/2022    Migraine without aura, with intractable migraine, so stated, with status migrainosus 02/07/2022    Type 2 diabetes mellitus (CMS [place]) 02/01/2022    Hypertension 02/01/2022     Dr. Viann Shove, Cardiologist.      Hyperlipidemia 02/01/2022    Vitamin D deficiency 02/01/2022    Vitamin B12 deficiency 02/01/2022    GERD (gastroesophageal reflux disease) 02/01/2022    Environmental and seasonal allergies 02/01/2022    Morbid obesity with BMI of 40.0-44.9, adult (CMS [place]) 02/01/2022    Uses birth control 02/01/2022     Managed by [facility]/Dr. Oletta Lamas, NP      Vaping nicotine dependence, tobacco product 02/01/2022    Coronary artery disease involving native coronary artery of native heart 02/01/2022     Managed by Dr. Viann Shove.  Hx of cardiac stent x 1.      Mild intermittent asthma without complication [id number]    Conductive hearing loss of left ear 08/19/2015    Chronic low back pain 10/25/2011     Last Assessment & Plan:    Formatting of this note might be different from the original.   Worse with coughing due to current illness.  Refill cyclobenzaprine.      GAD (generalized anxiety disorder) 09/27/2011       Historical Data    Past Medical History:  Past Medical History:   Diagnosis Date    Anxiety state     Asthma     Chronic sinusitis     Gallbladder disease     Heart disease     History of MI  (myocardial infarction)     History of non-ST elevation myocardial infarction (NSTEMI)     Hypertension     Stented coronary artery     Strep pharyngitis      Past Surgical History:  Past Surgical History:   Procedure Laterality Date    DENTAL SURGERY  2021    top dentures only  [place]    HX CHOLECYSTECTOMY  2019    HX CORONARY STENT PLACEMENT  2016    [place], [place]; Dr. Ronda Fairly, Cardiac Surgeon    [place]    they fixed lazy eye.    LEG SURGERY  2003    Dr. Maxie Better in [facility]     Allergies:  Allergies   Allergen Reactions    Amoxicillin-Pot Clavulanate Nausea/ Vomiting     Medications:  Current Outpatient Medications   Medication Sig    albuterol sulfate (PROVENTIL OR VENTOLIN OR PROAIR) 90 mcg/actuation Inhalation oral inhaler Take 1-2 Puffs by inhalation Every 6 hours as needed for Other    albuterol sulfate (PROVENTIL) 2.5 mg /3 mL (0.083 %) Inhalation nebulizer solution Take 3 mL (2.5 mg total) by nebulization Every  4 hours as needed for Wheezing for up to 30 days    atorvastatin (LIPITOR) 20 mg Oral Tablet Take 1 Tablet (20 mg total) by mouth Once a day for 90 days    dilTIAZem (DILT-XR) 120 mg Oral Capsule,Degradable Cnt Release Take 1 Capsule (120 mg total) by mouth Once a day    ergocalciferol, vitamin D2, (DRISDOL) 1,250 mcg (50,000 unit) Oral Capsule Take 1 Capsule (50,000 Units total) by mouth Every 7 days for 90 days    FARXIGA 10 mg Oral Tablet Take 1 Tablet (10 mg total) by mouth Once a day    flash glucose scanning reader (FREESTYLE LIBRE 2 READER) Does not apply Misc 1 Each Once a day    flash glucose sensor (FREESTYLE LIBRE 2 SENSOR N/A) 1 Each Every 7 days    fluticasone furoate-vilanteroL (BREO ELLIPTA) 100-25 mcg/dose Inhalation Disk with Device Take 1 Inhalation by inhalation Once a day for 90 days    medroxyPROGESTERone (DEPO-PROVERA) 150 mg/mL IntraMUSCULAR Syringe Inject 1 mL (150 mg total) into the muscle Every 3 months    montelukast  (SINGULAIR) 10 mg Oral Tablet Take 1 Tablet (10 mg total) by mouth Every evening    nitroGLYCERIN (NITROSTAT) 0.4 mg Sublingual Tablet, Sublingual Place 1 Tablet (0.4 mg total) under the tongue Every 5 minutes as needed for Chest pain    omeprazole (PRILOSEC) 20 mg Oral Capsule, Delayed Release(E.C.) Take 1 Capsule (20 mg total) by mouth Twice daily for 90 days    OZEMPIC 0.25 mg or 0.5 mg (2 mg/3 mL) Subcutaneous Pen Injector Inject 0.5 mg under the skin Every 7 days for 30 days     Family History:  Family Medical History:       Problem Relation (Age of Onset)    COPD Mother    Diabetes type II Mother    Drug/Alcohol assessment Father    Heart Disease Mother, Father    Hypertension (High Blood Pressure) Father            Social History:  Social History     Socioeconomic History    Marital status: Divorced    Number of children: 1    Years of education: 12    Highest education level: High school graduate   Occupational History    Occupation: [employer]   Tobacco Use    Smoking status: Former     Packs/day: 1.50     Years: 28.00     Additional pack years: 0.00     Total pack years: 42.00     Types: Cigarettes     Start date: 41     Quit date: 2020     Years since quitting: 4.1    Smokeless tobacco: Current    Tobacco comments:     Has been vaping since 2020   Vaping Use    Vaping Use: Every day    Substances: Nicotine, Flavoring    Devices: Disposable, Cartridge last 3 weeks   Substance and Sexual Activity    Alcohol use: Never    Drug use: Never    Sexual activity: Not Currently     Partners: Male     Comment: LMP: Using Depo-Provers q 3 months, G1P1A0.     Social Determinants of Health     Financial Resource Strain: Low Risk  (02/09/2022)    Financial Resource Strain     SDOH Financial: No   Transportation Needs: Low Risk  (02/09/2022)    Transportation Needs  SDOH Transportation: No   Social Connections: Low Risk  (02/09/2022)    Social Connections     SDOH Social Isolation: 5 or more times a week    Intimate Partner Violence: Low Risk  (02/09/2022)    Intimate Partner Violence     SDOH Domestic Violence: I have not had a partner in the past year.   Housing Stability: Low Risk  (02/09/2022)    Housing Stability     SDOH Housing Situation: I have housing.     SDOH Housing Worry: No           Review of Systems:  Any pertinent Review of Systems as addressed in the HPI above.    Physical Exam:  Vital Signs:  Vitals:    02/09/22 1550 02/09/22 1637   BP: (!) 140/75 135/80   Pulse: 84    Resp: 17    Temp: 36.8 C (98.3 F)    SpO2: 95%    Weight: 125 kg (276 lb 3.2 oz)    Height: 1.702 m (5' 7"$ )    BMI: 43.35      Physical Exam  Vitals reviewed.   HENT:      Head: Normocephalic.      Right Ear: Tympanic membrane, ear canal and external ear normal.      Left Ear: Tympanic membrane, ear canal and external ear normal.      Nose:      Right Turbinates: Pale.      Left Turbinates: Pale.      Mouth/Throat:      Dentition: Abnormal dentition.      Pharynx: Oropharynx is clear.   Eyes:      Extraocular Movements:      Right eye: Abnormal extraocular motion present.      Conjunctiva/sclera: Conjunctivae normal.   Cardiovascular:      Rate and Rhythm: Normal rate and regular rhythm.      Pulses: Normal pulses.      Heart sounds: Normal heart sounds, S1 normal and S2 normal.   Neurological:      General: No focal deficit present.      Cranial Nerves: Cranial nerves 2-12 are intact.      Coordination: Coordination is intact.      Gait: Gait is intact.   Psychiatric:         Behavior: Behavior normal.         Cognition and Memory: Cognition normal.          Assessment/Plan:  (E11.9) Type 2 diabetes mellitus (CMS [facility])  (primary encounter diagnosis)    (I10) Hypertension    (E78.5) Hyperlipidemia    (K21.9) GERD (gastroesophageal reflux disease)    (E53.8) Vitamin B12 deficiency    (E55.9) Vitamin D deficiency    (J30.89) Environmental and seasonal allergies    (E66.01,  Z68.41) Morbid obesity with BMI of 40.0-44.9, adult (CMS  HCC)    (F17.290) Vaping nicotine dependence, tobacco product  Plan: Refer to [place]    (J45.20) Mild intermittent asthma without complication    (A999333) Uses birth control    (F41.1) GAD (generalized anxiety disorder)       Problem List Items Addressed This Visit          Cardiovascular System    Hypertension     BP controlled.  Continue current medications         Hyperlipidemia     Continue atorvastatin 20 mg daily, tolerating well.  Reports a regular diet.  Advised to limit high fat foods and processed foods in diet.            Respiratory    Vaping nicotine dependence, tobacco product     Strongly advised smoking/vaping cessation.  Discussed risk of lung cancer.  Referred for smoking cessation.         Relevant Orders    Refer to [facility]essation[facility]    Mild intermittent asthma without complication     Strongly advised tobacco cessation/vaping cessation.  Continue albuterol q 4-6 hours as needed.  Ipratropium is not indicated.            Digestive    GERD (gastroesophageal reflux disease)       Endocrine    Type 2 diabetes mellitus (CMS [facility]) - Primary     A1c 6.5.  Does not monitor FBG.  Continue Ozempic 0.5 mg every 7 days.  Advised a diabetic eye exam, patient will schedule own eye exam.         Vitamin D deficiency    Vitamin B12 deficiency       Psychiatric    GAD (generalized anxiety disorder)     Denies SI/HI/AVH.            Obstetrics/Gynecology    Uses birth control     Managed by gynecology.            Other    Environmental and seasonal allergies     Dexamethasone 10 mg IM given today. Continue Singulair 10 mg daily as needed.  Hydrate well.  Advised over-the-counter Tylenol/Motrin as needed.         Morbid obesity with BMI of 40.0-44.9, adult (CMS [facility])     Advised a low-fat/low-sodium diet, advised 20 minutes a scheduled physical activity daily and to continue with weight loss efforts.  Patient is currently on Ozempic 0.5 mg every 7 days.             Orders Placed This  Encounter    Refer to [facility] Cessation[facility]    OZEMPIC 0.25 mg or 0.5 mg (2 mg/3 mL) Subcutaneous Pen Injector    omeprazole (PRILOSEC) 20 mg Oral Capsule, Delayed Release(E.C.)    atorvastatin (LIPITOR) 20 mg Oral Tablet    ergocalciferol, vitamin D2, (DRISDOL) 1,250 mcg (50,000 unit) Oral Capsule    fluticasone furoate-vilanteroL (BREO ELLIPTA) 100-25 mcg/dose Inhalation Disk with Device    dexAMETHasone 4 mg/mL injection      Tobacco cessation counseling performed for 6 minutes     Labs discussed today.  Continue current medications.  Advised a low-fat/low sodium diet, advised 150 minutes of scheduled weekly physical activity as tolerated.  Advised to maintain a healthy weight.   Return in about 3 months (around 05/10/2022) for routine visit; schedule adult wellness visit.    Jodi Mourning, NP-C     Portions of this note may be dictated using voice recognition software or a dictation service. Variances in spelling and vocabulary are possible and unintentional. Not all errors are caught/corrected. Please notify the [facility] if any discrepancies are noted or if the meaning of any statement is not clear.

## 2022-02-13 ENCOUNTER — Other Ambulatory Visit (RURAL_HEALTH_CENTER): Payer: Self-pay | Admitting: Family

## 2022-02-13 MED ORDER — ALBUTEROL SULFATE 2.5 MG/3 ML (0.083 %) SOLUTION FOR NEBULIZATION
2.5000 mg | INHALATION_SOLUTION | RESPIRATORY_TRACT | 1 refills | Status: AC | PRN
Start: 2022-02-13 — End: ?

## 2022-02-17 ENCOUNTER — Encounter (RURAL_HEALTH_CENTER): Payer: Self-pay | Admitting: Family

## 2022-02-17 NOTE — Assessment & Plan Note (Addendum)
Strongly advised tobacco cessation/vaping cessation.  Continue albuterol q 4-6 hours as needed.  Ipratropium is not indicated.

## 2022-02-17 NOTE — Assessment & Plan Note (Addendum)
A1c 6.5.  Does not monitor FBG.  Continue Ozempic 0.5 mg every 7 days.  Advised a diabetic eye exam, patient will schedule own eye exam.

## 2022-02-17 NOTE — Assessment & Plan Note (Addendum)
Dexamethasone 10 mg IM given today. Continue Singulair 10 mg daily as needed.  Hydrate well.  Advised over-the-counter Tylenol/Motrin as needed.

## 2022-02-17 NOTE — Assessment & Plan Note (Signed)
Advised a low-fat/low-sodium diet, advised 20 minutes a scheduled physical activity daily and to continue with weight loss efforts.  Patient is currently on Ozempic 0.5 mg every 7 days.

## 2022-02-17 NOTE — Assessment & Plan Note (Addendum)
Strongly advised smoking/vaping cessation.  Discussed risk of lung cancer.  Referred for smoking cessation.

## 2022-02-17 NOTE — Assessment & Plan Note (Signed)
Denies SI/HI/AVH.

## 2022-02-17 NOTE — Assessment & Plan Note (Signed)
BP controlled.  Continue current medications.

## 2022-02-17 NOTE — Assessment & Plan Note (Signed)
Continue atorvastatin 20 mg daily, tolerating well.  Reports a regular diet.  Advised to limit high fat foods and processed foods in diet.

## 2022-02-17 NOTE — Assessment & Plan Note (Signed)
Managed by gynecology.

## 2022-03-12 ENCOUNTER — Ambulatory Visit (RURAL_HEALTH_CENTER): Payer: Self-pay | Admitting: Family

## 2022-03-19 ENCOUNTER — Ambulatory Visit (RURAL_HEALTH_CENTER): Payer: Self-pay | Admitting: Family

## 2022-03-23 ENCOUNTER — Encounter (RURAL_HEALTH_CENTER): Payer: Self-pay | Admitting: Family

## 2022-03-23 ENCOUNTER — Other Ambulatory Visit: Payer: Self-pay

## 2022-03-23 ENCOUNTER — Ambulatory Visit (RURAL_HEALTH_CENTER): Payer: 59 | Attending: Family | Admitting: Family

## 2022-03-23 VITALS — BP 150/86 | HR 106 | Temp 98.1°F | Resp 16 | Ht 67.0 in | Wt 277.0 lb

## 2022-03-23 DIAGNOSIS — Z6841 Body Mass Index (BMI) 40.0 and over, adult: Secondary | ICD-10-CM | POA: Insufficient documentation

## 2022-03-23 DIAGNOSIS — Z7984 Long term (current) use of oral hypoglycemic drugs: Secondary | ICD-10-CM | POA: Insufficient documentation

## 2022-03-23 DIAGNOSIS — Z7985 Long-term (current) use of injectable non-insulin antidiabetic drugs: Secondary | ICD-10-CM | POA: Insufficient documentation

## 2022-03-23 DIAGNOSIS — E669 Obesity, unspecified: Secondary | ICD-10-CM | POA: Insufficient documentation

## 2022-03-23 DIAGNOSIS — R7989 Other specified abnormal findings of blood chemistry: Secondary | ICD-10-CM

## 2022-03-23 DIAGNOSIS — E119 Type 2 diabetes mellitus without complications: Secondary | ICD-10-CM | POA: Insufficient documentation

## 2022-03-23 HISTORY — DX: Other specified abnormal findings of blood chemistry: R79.89

## 2022-03-23 MED ORDER — SEMAGLUTIDE 0.25 MG OR 0.5 MG (2 MG/3 ML) SUBCUTANEOUS PEN INJECTOR
0.5000 mg | PEN_INJECTOR | SUBCUTANEOUS | 0 refills | Status: AC
Start: 2022-03-23 — End: 2022-04-22

## 2022-03-23 NOTE — Assessment & Plan Note (Addendum)
BMI 43.38.  Ozempic increased to 0.5 mg every week x 4 weeks.  Patient will follow up in 1 month.  Strongly advised a low-fat/low-sodium diet, advised to continue to limit simple carbs in her diet.  Advise 20 minutes of scheduled daily physical activity as tolerated and to continue with weight loss efforts.

## 2022-03-23 NOTE — Progress Notes (Addendum)
[place] [facility]      KADIA MILKEY  MRN: [id number]  DOB: 01/23/1975  Date of Service: 03/23/2022    CHIEF COMPLAINT  Chief Complaint   Patient presents with    FMLA       SUBJECTIVE  Sharon Walker is a 47 y.o. female who presents to clinic for completion of FMLA forms and to report uncontrolled glucose levels.    Patient states she has been having increased glucose level last week. Reports the following glucose levels week of 03/15/22 through 03/19/22. Monday:  Glucose 250+ all day, blood glucose of 54 in the evening.    Tuesday:  Fasting glucose 352.  Reports she checked glucose every 4 hours without a glucose level less than 350.    Wednesday:  FBG 545 and reports blood sugar did not drop below 198 the entire day.    Friday her blood sugar ranged from 234-250 all day.    Patient reports she stopped checking her blood glucose level after Friday, 03/18/22.       Patient reports she was followed by an endocrinologist in [place], [place]; but does not recall his name.  Patient also reports she was under the care of Dr. Daisy Floro.  Patient is currently taking Ozempic 0.25 mg weekly, Actos 30 mg daily and Farxiga 10 mg daily.  Patient reports 1 episode of hypoglycemia of 54 due to not eating all day.    Patient reports she does not exercise.  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  Patient reports a low-fat diet reports, reports she eats fast food 1 time per week.  Snacks only on pork rinds.  Patient reports eggs daily for breakfast.  Reports hamburger patty with ketchup for lunch.  Reports dinners consists of hamburger patty or chicken breast with beets or carrots as a side.        Review of Systems:  Positive ROS discussed in HPI, otherwise all other systems negative.      Medications:   albuterol sulfate (PROVENTIL) 2.5 mg /3 mL (0.083 %) Inhalation nebulizer solution, Take 3 mL (2.5 mg total) by nebulization Every 4 hours as needed for Wheezing for up to 30  days  atorvastatin (LIPITOR) 20 mg Oral Tablet, Take 1 Tablet (20 mg total) by mouth Once a day for 90 days  budesonide-formoteroL (SYMBICORT) 160-4.5 mcg/actuation Inhalation oral inhaler, Take 2 Puffs by inhalation Twice daily  cetirizine (ZYRTEC) 10 mg Oral Tablet, Take 1 Tablet (10 mg total) by mouth Every morning  dilTIAZem (DILT-XR) 120 mg Oral Capsule,Degradable Cnt Release, Take 1 Capsule (120 mg total) by mouth Once a day  ergocalciferol, vitamin D2, (DRISDOL) 1,250 mcg (50,000 unit) Oral Capsule, Take 1 Capsule (50,000 Units total) by mouth Every 7 days for 90 days  FARXIGA 10 mg Oral Tablet, Take 1 Tablet (10 mg total) by mouth Once a day  ipratropium-albuterol 0.5 mg-3 mg(2.5 mg base)/3 mL Solution for Nebulization, USE 1 VIAL IN NEBULIZER 4 TIMES DAILY AS NEEDED  loratadine (CLARITIN) 10 mg Oral Tablet, Take 1 Tablet (10 mg total) by mouth Once a day  medroxyPROGESTERone (DEPO-PROVERA) 150 mg/mL IntraMUSCULAR Syringe, Inject 1 mL (150 mg total) into the muscle Every 3 months  montelukast (SINGULAIR) 10 mg Oral Tablet, Take 1 Tablet (10 mg total) by mouth Every evening  nitroGLYCERIN (NITROSTAT) 0.4 mg Sublingual Tablet, Sublingual, Place 1 Tablet (0.4 mg total) under the tongue Every 5 minutes as needed for Chest pain  omeprazole (PRILOSEC) 20 mg Oral Capsule, Delayed Release(E.C.), Take 1 Capsule (20 mg total) by mouth Twice daily for 90 days  pioglitazone (ACTOS) 30 mg Oral Tablet, Take 1 Tablet (30 mg total) by mouth Every evening  albuterol sulfate (PROVENTIL OR VENTOLIN OR PROAIR) 90 mcg/actuation Inhalation oral inhaler, Take 1-2 Puffs by inhalation Every 6 hours as needed for Other  budesonide (PULMICORT RESPULES) 0.5 mg/2 mL Inhalation nebulizer suspension, USE 2 ML IN NEBULIZER TWICE DAILY  dulaglutide (TRULICITY) [id number] [id number] mL Subcutaneous Pen Injector, USE AS DIRECTED SUBCUTANEOULSY ONCE A WEEK  dulaglutide (TRULICITY) 1.5 99991111 mL Subcutaneous Pen Injector, INJECT 1.5MG  (0.5ML)  SUBCUTANEOUSLY ONCE A WEEK AS DIRECTED  estradiol cypionate (DEPO-ESTRADIOL IM),   ezetimibe (ZETIA) 10 mg Oral Tablet, Take 1 Tablet (10 mg total) by mouth Once a day  flash glucose scanning reader (FREESTYLE LIBRE 2 READER) Does not apply Misc, 1 Each Once a day  flash glucose sensor (FREESTYLE LIBRE 2 SENSOR N/A), 1 Each Every 7 days  fluticasone furoate-vilanteroL (BREO ELLIPTA) 100-25 mcg/dose Inhalation Disk with Device, Take 1 Inhalation by inhalation Once a day for 90 days  ondansetron (ZOFRAN ODT) 4 mg Oral Tablet, Rapid Dissolve, DISSOLVE 1 TABLET IN MOUTH EVERY 8 HOURS AS NEEDED  OZEMPIC 0.25 mg or 0.5 mg (2 mg/3 mL) Subcutaneous Pen Injector, Inject 0.5 mg under the skin Every 7 days for 30 days    No facility-administered medications prior to visit.      Allergies:   Allergies   Allergen Reactions    Amoxicillin-Pot Clavulanate Nausea/ Vomiting    Grass Pollen      Wheezing     Amoxicillin Nausea/ Vomiting     Did it involve swelling of the face/tongue/throat, SOB, or low BP? No   Did it involve sudden or severe rash/hives, skin peeling, or any reaction on the inside of your mouth or nose? No   Did you need to seek medical attention at a hospital or doctor's office? No   When did it last happen?         If all above answers are "NO", may proceed with cephalosporin use.         OBJECTIVE  BP (!) 150/86   Pulse (!) 106   Temp 36.7 C (98.1 F)   Resp 16   Ht 1.702 m (5\' 7" )   Wt 126 kg (277 lb)   SpO2 98%   BMI 43.38 kg/m       Physical Exam  Vitals reviewed.   Constitutional:       Appearance: She is obese.   Cardiovascular:      Rate and Rhythm: Normal rate and regular rhythm.      Pulses: Normal pulses.      Heart sounds: Normal heart sounds.   Neurological:      General: No focal deficit present.   Psychiatric:         Mood and Affect: Mood is anxious.         Cognition and Memory: Cognition normal.           ASSESSMENT/PLAN  (E11.9) Diabetes mellitus (CMS HCC)  (primary encounter  diagnosis)  Plan: COMPREHENSIVE METABOLIC PANEL, NON-FASTING    (E66.9) Obesity (BMI 35.0-39.9 without comorbidity)       Problem List Items Addressed This Visit          Endocrine    Diabetes mellitus (CMS [place]) - Primary     Discussed labs with patient, FBG 136 and A1c 6.5  on 02/01/22.   Patient declined labs today to recheck glucose levels.    Ozempic increased to 0.5 mg times 4 weeks.  Continue oral diabetic medications.   Advised a diabetic diet and daily scheduled physical activity.  Continue with weight loss efforts.    Patient will follow-up in 4 weeks to see how well she is tolerating medication and managing glucose levels.  Return to clinic sooner if need.     Goal is to decrease Actos and stop Trulicity.  Due cardiovascular history, patient will continue Farxiga 10 mg daily and Ozempic.  If insurance is a complication, will also consider Jardiance.   Continue a diabetic diet and weight loss efforts.         Relevant Orders    COMPREHENSIVE METABOLIC PANEL, NON-FASTING       Other    Obesity (BMI 35.0-39.9 without comorbidity)     BMI 43.38.  Ozempic increased to 0.5 mg every week x 4 weeks.  Patient will follow up in 1 month.  Strongly advised a low-fat/low-sodium diet, advised to continue to limit simple carbs in her diet.  Advise 20 minutes of scheduled daily physical activity as tolerated and to continue with weight loss efforts.           Orders Placed This Encounter    COMPREHENSIVE METABOLIC PANEL, NON-FASTING    semaglutide (OZEMPIC) 0.25 mg or 0.5 mg (2 mg/3 mL) Subcutaneous Pen Injector      FMLA forms completed and faxed today.  Ozempic increased to 0.5 mg every week x4 weeks.  Patient will follow-up in 1 month to see how well she is tolerating medication and to rechecked glucose levels.  CMP was ordered today and patient left facility without having labs.    Return in about 1 month (around 04/23/2022) for diabetic medication recheck.    Jodi Mourning, NP-C 03/23/2022, 17:41

## 2022-03-23 NOTE — Assessment & Plan Note (Addendum)
Discussed labs with patient, FBG 136 and A1c 6.5 on 02/01/22.   Patient declined labs today to recheck glucose levels.    Ozempic increased to 0.5 mg times 4 weeks.  Continue oral diabetic medications.   Advised a diabetic diet and daily scheduled physical activity.  Continue with weight loss efforts.    Patient will follow-up in 4 weeks to see how well she is tolerating medication and managing glucose levels.  Return to clinic sooner if need.     Goal is to decrease Actos and stop Trulicity.  Due cardiovascular history, patient will continue Farxiga 10 mg daily and Ozempic.  If insurance is a complication, will also consider Jardiance.   Continue a diabetic diet and weight loss efforts.

## 2022-04-21 ENCOUNTER — Ambulatory Visit (RURAL_HEALTH_CENTER): Payer: Self-pay | Admitting: Family

## 2022-04-22 ENCOUNTER — Ambulatory Visit (RURAL_HEALTH_CENTER): Payer: Self-pay | Admitting: Family

## 2022-05-13 ENCOUNTER — Ambulatory Visit (RURAL_HEALTH_CENTER): Payer: Self-pay | Admitting: Family

## 2022-07-04 ENCOUNTER — Other Ambulatory Visit: Payer: Self-pay

## 2022-09-24 ENCOUNTER — Encounter (INDEPENDENT_AMBULATORY_CARE_PROVIDER_SITE_OTHER): Payer: Self-pay | Admitting: NEUROLOGY

## 2023-10-17 ENCOUNTER — Other Ambulatory Visit (RURAL_HEALTH_CENTER): Payer: Self-pay | Admitting: Family Medicine

## 2023-10-17 DIAGNOSIS — Z1231 Encounter for screening mammogram for malignant neoplasm of breast: Secondary | ICD-10-CM
